# Patient Record
Sex: Male | Born: 1950
Health system: Southern US, Community
[De-identification: ages and names within clinical notes are randomized; demographics above are authoritative.]

## PROBLEM LIST (undated history)

## (undated) DIAGNOSIS — Z8619 Personal history of other infectious and parasitic diseases: Secondary | ICD-10-CM

## (undated) DIAGNOSIS — Z8673 Personal history of transient ischemic attack (TIA), and cerebral infarction without residual deficits: Secondary | ICD-10-CM

## (undated) DIAGNOSIS — M25511 Pain in right shoulder: Secondary | ICD-10-CM

## (undated) DIAGNOSIS — F32A Depression, unspecified: Secondary | ICD-10-CM

## (undated) DIAGNOSIS — F418 Other specified anxiety disorders: Secondary | ICD-10-CM

## (undated) DIAGNOSIS — K769 Liver disease, unspecified: Secondary | ICD-10-CM

## (undated) DIAGNOSIS — D649 Anemia, unspecified: Secondary | ICD-10-CM

## (undated) DIAGNOSIS — I639 Cerebral infarction, unspecified: Secondary | ICD-10-CM

## (undated) DIAGNOSIS — E785 Hyperlipidemia, unspecified: Secondary | ICD-10-CM

## (undated) DIAGNOSIS — F329 Major depressive disorder, single episode, unspecified: Secondary | ICD-10-CM

## (undated) DIAGNOSIS — I10 Essential (primary) hypertension: Secondary | ICD-10-CM

## (undated) DIAGNOSIS — F419 Anxiety disorder, unspecified: Secondary | ICD-10-CM

## (undated) HISTORY — DX: Anemia, unspecified: D64.9

## (undated) HISTORY — DX: Cerebral infarction, unspecified: I63.9

## (undated) HISTORY — DX: Hyperlipidemia, unspecified: E78.5

## (undated) HISTORY — DX: Personal history of other infectious and parasitic diseases: Z86.19

## (undated) HISTORY — DX: Major depressive disorder, single episode, unspecified: F32.9

## (undated) HISTORY — DX: Anxiety disorder, unspecified: F41.9

## (undated) HISTORY — DX: Liver disease, unspecified: K76.9

## (undated) HISTORY — DX: Personal history of transient ischemic attack (TIA), and cerebral infarction without residual deficits: Z86.73

## (undated) HISTORY — DX: Other specified anxiety disorders: F41.8

## (undated) HISTORY — DX: Pain in right shoulder: M25.511

## (undated) HISTORY — DX: Depression, unspecified: F32.A

---

## 2015-01-26 ENCOUNTER — Inpatient Hospital Stay (HOSPITAL_COMMUNITY)
Admission: EM | Admit: 2015-01-26 | Discharge: 2015-01-28 | DRG: 065 | Disposition: A | Discharge status: Home / Self Care | Payer: Self-pay | Attending: Internal Medicine | Admitting: Internal Medicine

## 2015-01-26 ENCOUNTER — Emergency Department (HOSPITAL_COMMUNITY): Payer: Self-pay

## 2015-01-26 ENCOUNTER — Encounter (HOSPITAL_COMMUNITY): Payer: Self-pay

## 2015-01-26 DIAGNOSIS — I63311 Cerebral infarction due to thrombosis of right middle cerebral artery: Principal | ICD-10-CM | POA: Diagnosis present

## 2015-01-26 DIAGNOSIS — R51 Headache: Secondary | ICD-10-CM

## 2015-01-26 DIAGNOSIS — F17210 Nicotine dependence, cigarettes, uncomplicated: Secondary | ICD-10-CM | POA: Diagnosis present

## 2015-01-26 DIAGNOSIS — R410 Disorientation, unspecified: Secondary | ICD-10-CM | POA: Diagnosis present

## 2015-01-26 DIAGNOSIS — Z79891 Long term (current) use of opiate analgesic: Secondary | ICD-10-CM

## 2015-01-26 DIAGNOSIS — I6529 Occlusion and stenosis of unspecified carotid artery: Secondary | ICD-10-CM | POA: Diagnosis present

## 2015-01-26 DIAGNOSIS — H547 Unspecified visual loss: Secondary | ICD-10-CM | POA: Diagnosis present

## 2015-01-26 DIAGNOSIS — E785 Hyperlipidemia, unspecified: Secondary | ICD-10-CM | POA: Insufficient documentation

## 2015-01-26 DIAGNOSIS — G8190 Hemiplegia, unspecified affecting unspecified side: Secondary | ICD-10-CM | POA: Diagnosis present

## 2015-01-26 DIAGNOSIS — F1010 Alcohol abuse, uncomplicated: Secondary | ICD-10-CM | POA: Diagnosis present

## 2015-01-26 DIAGNOSIS — Z8673 Personal history of transient ischemic attack (TIA), and cerebral infarction without residual deficits: Secondary | ICD-10-CM | POA: Insufficient documentation

## 2015-01-26 DIAGNOSIS — Z720 Tobacco use: Secondary | ICD-10-CM | POA: Insufficient documentation

## 2015-01-26 DIAGNOSIS — I6521 Occlusion and stenosis of right carotid artery: Secondary | ICD-10-CM | POA: Diagnosis present

## 2015-01-26 DIAGNOSIS — R4781 Slurred speech: Secondary | ICD-10-CM | POA: Diagnosis present

## 2015-01-26 DIAGNOSIS — Z791 Long term (current) use of non-steroidal anti-inflammatories (NSAID): Secondary | ICD-10-CM

## 2015-01-26 DIAGNOSIS — Z79899 Other long term (current) drug therapy: Secondary | ICD-10-CM

## 2015-01-26 DIAGNOSIS — R519 Headache, unspecified: Secondary | ICD-10-CM

## 2015-01-26 DIAGNOSIS — Z8249 Family history of ischemic heart disease and other diseases of the circulatory system: Secondary | ICD-10-CM

## 2015-01-26 DIAGNOSIS — K219 Gastro-esophageal reflux disease without esophagitis: Secondary | ICD-10-CM | POA: Diagnosis present

## 2015-01-26 DIAGNOSIS — Z9114 Patient's other noncompliance with medication regimen: Secondary | ICD-10-CM | POA: Diagnosis present

## 2015-01-26 DIAGNOSIS — R930 Abnormal findings on diagnostic imaging of skull and head, not elsewhere classified: Secondary | ICD-10-CM

## 2015-01-26 DIAGNOSIS — I639 Cerebral infarction, unspecified: Secondary | ICD-10-CM | POA: Diagnosis present

## 2015-01-26 DIAGNOSIS — I10 Essential (primary) hypertension: Secondary | ICD-10-CM | POA: Diagnosis present

## 2015-01-26 HISTORY — DX: Essential (primary) hypertension: I10

## 2015-01-26 LAB — BASIC METABOLIC PANEL
Anion gap: 8 (ref 5–15)
BUN: 11 mg/dL (ref 6–20)
CHLORIDE: 107 mmol/L (ref 101–111)
CO2: 23 mmol/L (ref 22–32)
Calcium: 8.6 mg/dL — ABNORMAL LOW (ref 8.9–10.3)
Creatinine, Ser: 0.88 mg/dL (ref 0.61–1.24)
GFR calc Af Amer: >60 mL/min (ref >60)
GFR calc non Af Amer: >60 mL/min (ref >60)
GLUCOSE: 82 mg/dL (ref 65–99)
Potassium: 4.4 mmol/L (ref 3.5–5.1)
Sodium: 138 mmol/L (ref 135–145)

## 2015-01-26 LAB — CBC WITH DIFFERENTIAL/PLATELET
Basophils Absolute: 0 10*3/uL (ref 0.0–0.1)
Basophils Relative: 0 % (ref 0–1)
EOS ABS: 0.2 10*3/uL (ref 0.0–0.7)
EOS PCT: 2 % (ref 0–5)
HEMATOCRIT: 43.6 % (ref 39.0–52.0)
Hemoglobin: 15 g/dL (ref 13.0–17.0)
LYMPHS ABS: 2.1 10*3/uL (ref 0.7–4.0)
LYMPHS PCT: 23 % (ref 12–46)
MCH: 30.2 pg (ref 26.0–34.0)
MCHC: 34.4 g/dL (ref 30.0–36.0)
MCV: 87.7 fL (ref 78.0–100.0)
MONO ABS: 0.4 10*3/uL (ref 0.1–1.0)
Monocytes Relative: 5 % (ref 3–12)
Neutro Abs: 6.2 10*3/uL (ref 1.7–7.7)
Neutrophils Relative %: 70 % (ref 43–77)
PLATELETS: 260 10*3/uL (ref 150–400)
RBC: 4.97 MIL/uL (ref 4.22–5.81)
RDW: 13.4 % (ref 11.5–15.5)
WBC: 8.9 10*3/uL (ref 4.0–10.5)

## 2015-01-26 LAB — I-STAT TROPONIN, ED: Troponin i, poc: 0 ng/mL (ref 0.00–0.08)

## 2015-01-26 MED ORDER — STROKE: EARLY STAGES OF RECOVERY BOOK
Freq: Once | Status: AC
  Administered 2015-01-26
  Filled 2015-01-26: qty 1

## 2015-01-26 MED ORDER — ENOXAPARIN SODIUM 40 MG/0.4ML ~~LOC~~ SOLN
40.0000 mg | SUBCUTANEOUS | Status: DC
  Administered 2015-01-27 – 2015-01-28 (×2): 40 mg via SUBCUTANEOUS
  Filled 2015-01-26 (×2): qty 0.4

## 2015-01-26 MED ORDER — HYDRALAZINE HCL 20 MG/ML IJ SOLN: 10.0000 mg | Freq: Once | INTRAMUSCULAR | Status: DC

## 2015-01-26 MED ORDER — SENNOSIDES-DOCUSATE SODIUM 8.6-50 MG PO TABS: 1.0000 | ORAL_TABLET | Freq: Every evening | ORAL | Status: DC | PRN

## 2015-01-26 NOTE — ED Provider Notes (Signed)
CSN: [id number]     Arrival date & time 01/26/15  1332 History   First MD Initiated Contact with Patient 01/26/15 1402     Chief Complaint  Patient presents with  . Altered Mental Status      HPI  Patient presents for evaluation via [facility] with a headache, hypertension, and change in behavior.  She hypertension. Is on lisinopril and hydrochlorothiazide. For the last 12 hours has "not felt right". He states yesterday he felt "weird" but cannot really specify. No family noticed that he is speaking last. However he is not aphasic. He is oriented lucid. He has a difficult coordination when he was getting something from out of a cabinet underneath the sink this morning and he lost his balance and struck his left fore head. He states he had a severe headache this morning on his right side, but this has resolved and he is pain-free now and no neck pain. No nausea or vomiting. Denies vision changes. No numbness or weakness to the extremities.  No past medical history on file. No past surgical history on file. No family history on file. History  Substance Use Topics  . Smoking status: Not on file  . Smokeless tobacco: Not on file  . Alcohol Use: Not on file    Review of Systems  Constitutional: Negative for fever, chills, diaphoresis, appetite change and fatigue.  HENT: Negative for mouth sores, sore throat and trouble swallowing.   Eyes: Negative for visual disturbance.  Respiratory: Negative for cough, chest tightness, shortness of breath and wheezing.   Cardiovascular: Negative for chest pain.  Gastrointestinal: Negative for nausea, vomiting, abdominal pain, diarrhea and abdominal distention.  Endocrine: Negative for polydipsia, polyphagia and polyuria.  Genitourinary: Negative for dysuria, frequency and hematuria.  Musculoskeletal: Negative for gait problem.  Skin: Negative for color change, pallor and rash.  Neurological: Positive for headaches. Negative for dizziness, syncope  and light-headedness.  Hematological: Does not bruise/bleed easily.  Psychiatric/Behavioral: Negative for behavioral problems and confusion.      Allergies  Review of patient's allergies indicates no known allergies.  Home Medications   Prior to Admission medications   Medication Sig Start Date End Date Taking? Authorizing Provider  amoxicillin-clavulanate (AUGMENTIN) 875-125 MG per tablet Take 1 tablet by mouth 2 (two) times daily.   Yes Historical Provider, MD  hydrochlorothiazide (HYDRODIURIL) 25 MG tablet Take 25 mg by mouth daily.   Yes Historical Provider, MD  HYDROcodone-acetaminophen (NORCO/VICODIN) 5-325 MG per tablet Take 1 tablet by mouth every 6 (six) hours as needed for moderate pain.   Yes Historical Provider, MD  ibuprofen (ADVIL,MOTRIN) 800 MG tablet Take 800 mg by mouth 3 (three) times daily.   Yes Historical Provider, MD  lisinopril (PRINIVIL,ZESTRIL) 20 MG tablet Take 20 mg by mouth every evening.   Yes Historical Provider, MD  naproxen sodium (ANAPROX) 220 MG tablet Take 220 mg by mouth 2 (two) times daily with a meal.   Yes Historical Provider, MD   BP 194/79 mmHg  Pulse 70  Temp(Src) 98.7 F (37.1 C) (Oral)  Resp 15  Ht 5\' 7"  (1.702 m)  Wt 175 lb (79.379 kg)  BMI 27.40 kg/m2  SpO2 96% Physical Exam  Constitutional: He is oriented to person, place, and time. He appears well-developed and well-nourished. No distress.  HENT:  Head: Normocephalic.  Eyes: Conjunctivae are normal. Pupils are equal, round, and reactive to light. No scleral icterus.  Neck: Normal range of motion. Neck supple. No thyromegaly present.  Cardiovascular:  Normal rate and regular rhythm.  Exam reveals no gallop and no friction rub.   No murmur heard. Pulmonary/Chest: Effort normal and breath sounds normal. No respiratory distress. He has no wheezes. He has no rales.  Abdominal: Soft. Bowel sounds are normal. He exhibits no distension. There is no tenderness. There is no rebound.   Musculoskeletal: Normal range of motion.  Neurological: He is alert and oriented to person, place, and time.  No cranial nerve deficits. However with vision testing is inconsistent with identifying movements to his left visual field testing. Minimal dysmetria with left upper shoulder but no pronator drift. Normal strength and use of the lower extremity. Gait not tested.  Skin: Skin is warm and dry. No rash noted.  Psychiatric: He has a normal mood and affect. His behavior is normal.    ED Course  Procedures (including critical care time) Labs Review Labs Reviewed  CBC WITH DIFFERENTIAL/PLATELET  BASIC METABOLIC PANEL  I-STAT TROPOININ, ED    Imaging Review Ct Head Wo Contrast  01/26/2015   CLINICAL DATA:  Patient fell off region for object over head. Confusion after fall.  EXAM: CT HEAD WITHOUT CONTRAST  TECHNIQUE: Contiguous axial images were obtained from the base of the skull through the vertex without intravenous contrast.  COMPARISON:  None.  FINDINGS: The ventricles are normal in size and configuration. There is decreased attenuation in the anterior right parietal lobe extending to involve a portion of the lateral superior right occipital lobe and posterior superior right temporal lobe. This appearance is consistent with an acute infarct. There is cytotoxic edema in this area. There is no other mass effect. There is no hemorrhage or extra-axial fluid. No midline shift. Elsewhere, there is slight small vessel disease in the centra semiovale bilaterally.  Bony calvarium appears intact.  The mastoid air cells are clear.  IMPRESSION: Findings consistent with an acute infarct involving portions of the posterior superior right temporal lobe, lateral right occipital lobe and a portion of the right parietal lobe. Although felt to be less likely, edema from an underlying neoplasm potentially could present in this manner. Brain MR to differentiate between these entities would be reasonable. Note that  there is no hemorrhage or midline shift. No extra-axial fluid collections are identified.  Elsewhere, there is rather minimal periventricular small vessel disease.  These results were called by telephone at the time of interpretation on 01/26/2015 at 2:49 pm to Dr. Tanna Furry , who verbally acknowledged these results.   Electronically Signed   By: Lowella Grip III M.D.   On: 01/26/2015 14:49     EKG Interpretation None      MDM   Final diagnoses:  Headache  Cerebral infarction due to unspecified mechanism    ED scan shows large area of encephalomalacia in the right temporoparietal brain. Likely CVA, versus tumor and edema per radiologist report. I discussed the case with Dr. Myrtie Cruise who will see the patient in consultation for neurology. Patient will be admitted to unassigned medicine.    Tanna Furry, MD 01/26/15 [phone]

## 2015-01-26 NOTE — Consult Note (Addendum)
Referring Physician: Dr Jeneen Rinks    Chief Complaint: - AMS - (Tx from [facility])  HPI: Ray Williams is an 64 y.o. male with a history of hypertension transferred to [facility] from [facility] for evaluation of speech difficulties, headache (resolved), AMS, and balance issues. Most of the history was obtained from the patient's family including his wife, daughter, and son-in-law. Apparently the patient has been having difficulties with severe hypertension recently. He was seen at the urgent care center and by his physician and prescribed Lotensin as well as hydrochlorothiazide. He does not take his medications as instructed because they make him feel bad.   He had to have a dental extraction earlier this week however the procedure was postponed secondary to elevated blood pressures. He returned on Thursday of this week and had 3 teeth pulled and was placed on amoxicillin, ibuprofen, and hydrocodone. On Friday he did not feel well; however, the family attributed this to having had his teeth pulled. When he went to bed last night he essentially seemed normal.   This morning he awoke at 7 AM with a severe headache. It was located over his right eye. He described it as dull and a 7 on a 1-10 scale. It lasted approximately one hour. He took several ibuprofen without relief. He denies taking any hydrocodone today. The patient's daughter reports that he had cluster headaches 3-4 years ago although these resolved. Today at approximately 10:30 the patient started to act strange, lethargic ,and was felt to be confused. He also had some balance problems and fell into a cabinet and bruised his forehead. He was brought to the emergency department for further evaluation. A CT scan was performed that was consistent with an acute infarct. An MRI/MRA is pending. The emergency room department nurse examined him prior to his MRI and gave him an NIH score of 1.  Date last known well: Date:  01/25/2015 Time last known well: Unable to determine tPA Given: No: Late presentation. Minimal deficits.  No past medical history on file. hypertension, remote cluster headaches, recent dental extractions  No past surgical history on file. No significant surgical history per family.  Family History: There is no family history of strokes.  Social History: The patient is a long-time smoker and smokes approximately 1 pack of cigarettes per day. He drinks about 12 beers over the course of a weekend.  Allergies: No Known Allergies  Medications:  No current facility-administered medications for this encounter.   Current Outpatient Prescriptions  Medication Sig Dispense Refill  . amoxicillin-clavulanate (AUGMENTIN) 875-125 MG per tablet Take 1 tablet by mouth 2 (two) times daily.    . hydrochlorothiazide (HYDRODIURIL) 25 MG tablet Take 25 mg by mouth daily.    Marland Kitchen HYDROcodone-acetaminophen (NORCO/VICODIN) 5-325 MG per tablet Take 1 tablet by mouth every 6 (six) hours as needed for moderate pain.    Marland Kitchen ibuprofen (ADVIL,MOTRIN) 800 MG tablet Take 800 mg by mouth 3 (three) times daily.    Marland Kitchen lisinopril (PRINIVIL,ZESTRIL) 20 MG tablet Take 20 mg by mouth every evening.    . naproxen sodium (ANAPROX) 220 MG tablet Take 220 mg by mouth 2 (two) times daily with a meal.       ROS: History obtained from The patient's family  General ROS: negative for - chills, fatigue, fever, night sweats, weight gain or weight loss. The patient has not felt well recently but they attributed this to recent dental extractions. He also had reported that his blood pressure medications made  him feel bad and he did not continue medications as prescribed. He has felt mildly fatigued. Psychological ROS: negative for - behavioral disorder, hallucinations, memory difficulties, mood swings or suicidal ideation Ophthalmic ROS: negative for - blurry vision, double vision, eye pain or loss of vision ENT ROS: negative for -  epistaxis, nasal discharge, oral lesions, sore throat, tinnitus or vertigo Allergy and Immunology ROS: negative for - hives or itchy/watery eyes Hematological and Lymphatic ROS: negative for - bleeding problems, bruising or swollen lymph nodes Endocrine ROS: negative for - galactorrhea, hair pattern changes, polydipsia/polyuria or temperature intolerance Respiratory ROS: negative for - cough, hemoptysis, shortness of breath or wheezing Cardiovascular ROS: negative for - chest pain, dyspnea on exertion, edema or irregular heartbeat Gastrointestinal ROS: negative for - abdominal pain, diarrhea, hematemesis, nausea/vomiting or stool incontinence Genito-Urinary ROS: negative for - dysuria, hematuria, incontinence or urinary frequency/urgency Musculoskeletal ROS: negative for - joint swelling or muscular weakness Neurological ROS: as noted in HPI Dermatological ROS: negative for rash and skin lesion changes   Physical Examination: Blood pressure 194/79, pulse 95, temperature 98.7 F (37.1 C), temperature source Oral, resp. rate 15, height 5\' 7"  (1.702 m), weight 79.379 kg (175 lb), SpO2 96 %.  General - 64 year old male with somewhat of an odd affect but in no acute distress. Heart - Regular rate and rhythm  Lungs - Clear but decreased Abdomen - Soft - non tender Extremities - Distal pulses intact - no edema Skin - Warm and dry  Mental Status: Alert, oriented, thought content appropriate.  Speech fluent without evidence of aphasia.  Able to follow 3 step commands without difficulty. Cranial Nerves: II: Discs not visualized; he has a partial left field cut., pupils equal, round, reactive to light and accommodation III,IV, VI: ? Mild left ptosis, extra-ocular motions intact bilaterally V,VII: smile symmetric, facial light touch sensation normal bilaterally VIII: hearing normal bilaterally IX,X: gag reflex present XI: bilateral shoulder shrug XII: midline tongue extension Motor: Right  : Upper extremity   5/5    Left:     Upper extremity   5/5  Lower extremity   5/5     Lower extremity   5/5 Tone and bulk:normal tone throughout; no atrophy noted Sensory: Pinprick and light touch intact throughout, bilaterally Deep Tendon Reflexes: 2+ and symmetric throughout Plantars: Right: downgoing   Left: downgoing Cerebellar: normal finger-to-nose, normal rapid alternating movements and normal heel-to-shin test Gait: Deferred    Laboratory Studies:  Basic Metabolic Panel:  Recent Labs Lab 01/26/15 1400  NA 138  K 4.4  CL 107  CO2 23  GLUCOSE 82  BUN 11  CREATININE 0.88  CALCIUM 8.6*    Liver Function Tests: No results for input(s): AST, ALT, ALKPHOS, BILITOT, PROT, ALBUMIN in the last 168 hours. No results for input(s): LIPASE, AMYLASE in the last 168 hours. No results for input(s): AMMONIA in the last 168 hours.  CBC:  Recent Labs Lab 01/26/15 1400  WBC 8.9  NEUTROABS 6.2  HGB 15.0  HCT 43.6  MCV 87.7  PLT 260    Cardiac Enzymes: No results for input(s): CKTOTAL, CKMB, CKMBINDEX, TROPONINI in the last 168 hours.  BNP: Invalid input(s): POCBNP  CBG: No results for input(s): GLUCAP in the last 168 hours.  Microbiology: No results found for this or any previous visit.  Coagulation Studies: No results for input(s): LABPROT, INR in the last 72 hours.  Urinalysis: No results for input(s): COLORURINE, LABSPEC, PHURINE, GLUCOSEU, HGBUR, BILIRUBINUR, KETONESUR, PROTEINUR, UROBILINOGEN, NITRITE, LEUKOCYTESUR in  the last 168 hours.  Invalid input(s): APPERANCEUR  Lipid Panel: No results found for: CHOL, TRIG, HDL, CHOLHDL, VLDL, LDLCALC  HgbA1C: No results found for: HGBA1C  Urine Drug Screen:  No results found for: LABOPIA, COCAINSCRNUR, LABBENZ, AMPHETMU, THCU, LABBARB  Alcohol Level: No results for input(s): ETH in the last 168 hours.  Other results: EKG: Sinus rhythm rate 73 bpm with multiple PVCs. Please see the formal cardiology reading  for complete details.  Imaging:  Ct Head Wo Contrast 01/26/2015    Findings consistent with an acute infarct involving portions of the posterior superior right temporal lobe, lateral right occipital lobe and a portion of the right parietal lobe. Although felt to be less likely, edema from an underlying neoplasm potentially could present in this manner. Brain MR to differentiate between these entities would be reasonable. Note that there is no hemorrhage or midline shift. No extra-axial fluid collections are identified.  Elsewhere, there is rather minimal periventricular small vessel disease.    MRI / MRA of the brain without contrast 01/26/2015 Pending   Assessment: 64 y.o. male with a history of hypertension but noncompliant with medications as they make him feel tired. He had 3 teeth removed this past Thursday and was prescribed amoxicillin, hydrocodone, and ibuprofen. He was somewhat lethargic on Friday but otherwise unremarkable. He awoke at 7 AM today with a severe headache over his left eye she described as a 7 on a 1-10 scale and lasted approximately one hour despite 1600 mg of ibuprofen. At around 10:30 AM the family felt he was acting strange and he was brought to [facility] and transferred to [facility] for [facility] evaluation. CT scan as noted above. MRI/MRA is pending.  Further assessment and plan per Dr. Leonel Ramsay  Stroke Risk Factors - hypertension and smoking  Plan: 1. HgbA1c, fasting lipid panel 2. MRI, MRA  of the brain without contrast 3. PT consult, OT consult, Speech consult 4. Echocardiogram 5. Carotid dopplers 6. Prophylactic therapy-Antiplatelet med: Aspirin - dose 81 mg daily 7. NPO until RN stroke swallow screen 8. Telemetry monitoring 9. Frequent neuro checks   Lowry Ram [facility] Pager [phone] 01/26/2015, 5:49 PM   I have seen and evaluated the patient. I have reviewed the above note and made appropriate  changes.    64 year old male with new right parietotemporal infarct. Given the acuity the onset of symptoms, I think neoplasm is less likely. He is going to be admitted for stroke workup.  Roland Rack, MD [facility] [phone]  If 7pm- 7am, please page neurology on call as listed in [place].

## 2015-01-26 NOTE — ED Notes (Signed)
Pt in from [facility], per report pt fell today while reaching to get something out of his cabinet, -LOC, per the pts family the pt was noted to be less talkative & confused this morning, last seen normal last night, A&O x4, follows commands, speaks in complete sentences, denies pain, non compliant with HTN med-Lisinopril, pt seen at [place] today for BP check & was told to come here, -slurred speech, no facial droop

## 2015-01-26 NOTE — ED Notes (Signed)
Neuro PA at bedside, pt remains in MRI

## 2015-01-26 NOTE — Progress Notes (Signed)
Attempted to get report from ED for pt transfer. Will call back. Bobbye Charleston, RN

## 2015-01-26 NOTE — Progress Notes (Signed)
Pt. Arrived to unit alert and oriented. Oriented to unit and room. Call bell at side. Bed alarm activated. Will continue to monitor. Bobbye Charleston, RN

## 2015-01-26 NOTE — ED Provider Notes (Addendum)
Physical Exam  BP 150/96 mmHg  Pulse 105  Temp(Src) 98.9 F (37.2 C) (Oral)  Resp 14  Ht 5\' 7"  (1.702 m)  Wt 175 lb (79.379 kg)  BMI 27.40 kg/m2  SpO2 97%  Physical Exam  ED Course  Procedures  MDM Patient remained down in the ED with no orders. Unassigned medicine was paged. But don't know who is suppose to admit the patient. Will page for unassigned again. MRI showed acute stroke. Will admit for stroke workup. Discussed with Dr. Arnoldo Morale, who will admit.   Results for orders placed or performed during the hospital encounter of 01/26/15  CBC with Differential/Platelet  Result Value Ref Range   WBC 8.9 4.0 - 10.5 K/uL   RBC 4.97 4.22 - 5.81 MIL/uL   Hemoglobin 15.0 13.0 - 17.0 g/dL   HCT 43.6 39.0 - 52.0 %   MCV 87.7 78.0 - 100.0 fL   MCH 30.2 26.0 - 34.0 pg   MCHC 34.4 30.0 - 36.0 g/dL   RDW 13.4 11.5 - 15.5 %   Platelets 260 150 - 400 K/uL   Neutrophils Relative % 70 43 - 77 %   Neutro Abs 6.2 1.7 - 7.7 K/uL   Lymphocytes Relative 23 12 - 46 %   Lymphs Abs 2.1 0.7 - 4.0 K/uL   Monocytes Relative 5 3 - 12 %   Monocytes Absolute 0.4 0.1 - 1.0 K/uL   Eosinophils Relative 2 0 - 5 %   Eosinophils Absolute 0.2 0.0 - 0.7 K/uL   Basophils Relative 0 0 - 1 %   Basophils Absolute 0.0 0.0 - 0.1 K/uL  Basic metabolic panel  Result Value Ref Range   Sodium 138 135 - 145 mmol/L   Potassium 4.4 3.5 - 5.1 mmol/L   Chloride 107 101 - 111 mmol/L   CO2 23 22 - 32 mmol/L   Glucose, Bld 82 65 - 99 mg/dL   BUN 11 6 - 20 mg/dL   Creatinine, Ser 0.88 0.61 - 1.24 mg/dL   Calcium 8.6 (L) 8.9 - 10.3 mg/dL   GFR calc non Af Amer >60 >60 mL/min   GFR calc Af Amer >60 >60 mL/min   Anion gap 8 5 - 15  I-stat troponin, ED  Result Value Ref Range   Troponin i, poc 0.00 0.00 - 0.08 ng/mL   Comment 3           Ct Head Wo Contrast  01/26/2015   CLINICAL DATA:  Patient fell off region for object over head. Confusion after fall.  EXAM: CT HEAD WITHOUT CONTRAST  TECHNIQUE: Contiguous axial  images were obtained from the base of the skull through the vertex without intravenous contrast.  COMPARISON:  None.  FINDINGS: The ventricles are normal in size and configuration. There is decreased attenuation in the anterior right parietal lobe extending to involve a portion of the lateral superior right occipital lobe and posterior superior right temporal lobe. This appearance is consistent with an acute infarct. There is cytotoxic edema in this area. There is no other mass effect. There is no hemorrhage or extra-axial fluid. No midline shift. Elsewhere, there is slight small vessel disease in the centra semiovale bilaterally.  Bony calvarium appears intact.  The mastoid air cells are clear.  IMPRESSION: Findings consistent with an acute infarct involving portions of the posterior superior right temporal lobe, lateral right occipital lobe and a portion of the right parietal lobe. Although felt to be less likely, edema from an underlying neoplasm potentially  could present in this manner. Brain MR to differentiate between these entities would be reasonable. Note that there is no hemorrhage or midline shift. No extra-axial fluid collections are identified.  Elsewhere, there is rather minimal periventricular small vessel disease.  These results were called by telephone at the time of interpretation on 01/26/2015 at 2:49 pm to Dr. Tanna Furry , who verbally acknowledged these results.   Electronically Signed   By: Lowella Grip III M.D.   On: 01/26/2015 14:49   [name] Head Wo Contrast  01/26/2015   CLINICAL DATA:  Abnormal CT.  Headache and hypertension.  EXAM: MRI HEAD WITHOUT CONTRAST  MRA HEAD WITHOUT CONTRAST  TECHNIQUE: Multiplanar, multiecho pulse sequences of the brain and surrounding structures were obtained without intravenous contrast. Angiographic images of the head were obtained using MRA technique without contrast.  COMPARISON:  CT head 01/24/2015  FINDINGS: MRI HEAD FINDINGS  Acute infarct in the  right temporoparietal lobe involving the posterior insula and parietal lobe. This corresponds to the abnormality on CT. Thrombus is noted in the parietal branch of the right middle cerebral artery. The right internal carotid artery is occluded.  Chronic infarct in the right frontal cortex. No other areas of chronic ischemia.  No intracranial hemorrhage.  Negative for mass or edema.  Ventricle size normal.  No shift of the midline structures.  Pituitary normal in size.  Mucosal edema in the paranasal sinuses.  MRA HEAD FINDINGS  Both vertebral arteries patent to the basilar. PICA patent bilaterally. Small fenestration proximal basilar. Basilar widely patent. Superior cerebellar and posterior cerebral arteries widely patent  Right internal carotid artery is occluded through the cavernous segment. Reconstitution of supraclinoid internal carotid artery. Severe stenosis right A1 segment. Right A2 segment patent. Right M1 segment is patent. There is occlusion of the parietal branch of the right middle cerebral artery. Right temporal branch is patent  Mild atherosclerotic disease left cavernous carotid. Mild stenosis left A1 segment. Moderate stenosis left M1 segment. Mild disease in the left middle cerebral artery branches.  IMPRESSION: Acute infarct right middle cerebral artery territory involving the posterior insula and right parietal lobe. Negative for hemorrhage  Occlusion of the right internal carotid artery. Occlusion of the parietal branch of the right middle cerebral artery.  Atherosclerotic disease in the anterior middle cerebral arteries bilaterally.   Electronically Signed   By: Franchot Gallo M.D.   On: 01/26/2015 18:13   Mr Brain Wo Contrast  01/26/2015   CLINICAL DATA:  Abnormal CT.  Headache and hypertension.  EXAM: MRI HEAD WITHOUT CONTRAST  MRA HEAD WITHOUT CONTRAST  TECHNIQUE: Multiplanar, multiecho pulse sequences of the brain and surrounding structures were obtained without intravenous contrast.  Angiographic images of the head were obtained using MRA technique without contrast.  COMPARISON:  CT head 01/24/2015  FINDINGS: MRI HEAD FINDINGS  Acute infarct in the right temporoparietal lobe involving the posterior insula and parietal lobe. This corresponds to the abnormality on CT. Thrombus is noted in the parietal branch of the right middle cerebral artery. The right internal carotid artery is occluded.  Chronic infarct in the right frontal cortex. No other areas of chronic ischemia.  No intracranial hemorrhage.  Negative for mass or edema.  Ventricle size normal.  No shift of the midline structures.  Pituitary normal in size.  Mucosal edema in the paranasal sinuses.  MRA HEAD FINDINGS  Both vertebral arteries patent to the basilar. PICA patent bilaterally. Small fenestration proximal basilar. Basilar widely patent. Superior cerebellar and  posterior cerebral arteries widely patent  Right internal carotid artery is occluded through the cavernous segment. Reconstitution of supraclinoid internal carotid artery. Severe stenosis right A1 segment. Right A2 segment patent. Right M1 segment is patent. There is occlusion of the parietal branch of the right middle cerebral artery. Right temporal branch is patent  Mild atherosclerotic disease left cavernous carotid. Mild stenosis left A1 segment. Moderate stenosis left M1 segment. Mild disease in the left middle cerebral artery branches.  IMPRESSION: Acute infarct right middle cerebral artery territory involving the posterior insula and right parietal lobe. Negative for hemorrhage  Occlusion of the right internal carotid artery. Occlusion of the parietal branch of the right middle cerebral artery.  Atherosclerotic disease in the anterior middle cerebral arteries bilaterally.   Electronically Signed   By: Franchot Gallo M.D.   On: 01/26/2015 18:13          Wandra Arthurs, MD 01/26/15 2108  Wandra Arthurs, MD 01/26/15 2109

## 2015-01-26 NOTE — ED Notes (Signed)
Patient transported to CT 

## 2015-01-26 NOTE — ED Notes (Signed)
Attempted to call report

## 2015-01-26 NOTE — Progress Notes (Addendum)
Report attempted. Nurse will call back. Bobbye Charleston, RN

## 2015-01-26 NOTE — H&P (Addendum)
[facility] Admission History and Physical       Ray Williams WUJ:[id number] DOB: 10/21/1950 DOA: 01/26/2015  Referring physician:   PCP: Pcp Not In System  Specialists:   Chief Complaint: Headache Confusion and Slurred Speech  HPI: Ray Williams is a 64 y.o. male with a history of Uncontrolled HTN, who was seen at an [facility] in [place] due to Headache Confusion and Dysarthria since the AM and sent by EMS to [facility] for a Stoke Workup   His family reports that he was last seen normal last night , and in the AM upon awakening he had complaints of a right Temporal area headache.  At 10:45 his daughter called him on the telephone and he was not acting right, and his speech was slurred.  He was confused.  He had also hit the Left side of his head on the corner of a cabinet while trying to reach something.    His family took him to an area [facility] and was transferred via EMS to [facility], but he was outside of the window for TPA.  He was seen by Neurology and a CT scan and MRI/MRA were performed which revealed and acute Right MCA Territory Infarct as well as occlusion of the Right Internal Carotid ARtery.     He was referred for further evaluation.      Review of Systems:  Constitutional: No Weight Loss, No Weight Gain, Night Sweats, Fevers, Chills, Dizziness, Light Headedness, Fatigue, or Generalized Weakness HEENT:  +Headaches, Difficulty Swallowing,Tooth/Dental Problems,Sore Throat,  No Sneezing, Rhinitis, Ear Ache, Nasal Congestion, or Post Nasal Drip,  Cardio-vascular:  No Chest pain, Orthopnea, PND, Edema in Lower Extremities, Anasarca, Dizziness, Palpitations  Resp: No Dyspnea, No DOE, No Productive Cough, No Non-Productive Cough, No Hemoptysis, No Wheezing.    GI: No Heartburn, Indigestion, Abdominal Pain, Nausea, Vomiting, Diarrhea, Constipation, Hematemesis, Hematochezia, Melena, Change in Bowel Habits,  Loss of Appetite  GU: No Dysuria, No Change in Color of Urine, No  Urgency or Urinary Frequency, No Flank pain.  Musculoskeletal: No Joint Pain or Swelling, No Decreased Range of Motion, No Back Pain.  Neurologic: No Syncope, No Seizures, Muscle Weakness, Paresthesia, Vision Disturbance or Loss, No Diplopia, No Vertigo, +Dysarthria, No Difficulty Walking,  Skin: No Rash or Lesions. Psych: No Change in Mood or Affect, No Depression or Anxiety, No Memory loss, +Confusion, or Hallucinations     Past Medical History:  Hypertension    Past Surgical History:    Recent Dental Extraction    Prior to Admission medications   Medication Sig Start Date End Date Taking? Authorizing Provider  amoxicillin-clavulanate (AUGMENTIN) 875-125 MG per tablet Take 1 tablet by mouth 2 (two) times daily.   Yes Historical Provider, MD  hydrochlorothiazide (HYDRODIURIL) 25 MG tablet Take 25 mg by mouth daily.   Yes Historical Provider, MD  HYDROcodone-acetaminophen (NORCO/VICODIN) 5-325 MG per tablet Take 1 tablet by mouth every 6 (six) hours as needed for moderate pain.   Yes Historical Provider, MD  ibuprofen (ADVIL,MOTRIN) 800 MG tablet Take 800 mg by mouth 3 (three) times daily.   Yes Historical Provider, MD  lisinopril (PRINIVIL,ZESTRIL) 20 MG tablet Take 20 mg by mouth every evening.   Yes Historical Provider, MD  naproxen sodium (ANAPROX) 220 MG tablet Take 220 mg by mouth 2 (two) times daily with a meal.   Yes Historical Provider, MD     No Known Allergies     Social History:  has no tobacco, alcohol, and drug  history on file.      Family History:     + HTN   Physical Exam:  GEN:  Pleasant  64 y.o. male examined and in no acute distress; cooperative with exam Filed Vitals:   01/26/15 1900 01/26/15 1929 01/26/15 1930 01/26/15 2030  BP: 174/83  150/96 160/65  Pulse: 88  105 42  Temp:  98.9 F (37.2 C)    TempSrc:      Resp: 12  14 13   Height:      Weight:      SpO2: 97%  97% 98%   Blood pressure 160/65, pulse 42, temperature 98.9 F (37.2 C),  temperature source Oral, resp. rate 13, height 5\' 7"  (1.702 m), weight 79.379 kg (175 lb), SpO2 98 %. PSYCH: He is alert and oriented x4; does not appear anxious does not appear depressed; affect is normal HEENT: Normocephalic and Atraumatic, Mucous membranes pink; PERRLA; EOM intact; Fundi:  Benign;  No scleral icterus, Nares: Patent, Oropharynx: Clear,  Fair Dentition,    Neck:  FROM, No Cervical Lymphadenopathy nor Thyromegaly or Carotid Bruit; No JVD; Breasts:: Not examined CHEST WALL: No tenderness CHEST: Normal respiration, clear to auscultation bilaterally HEART: Regular rate and rhythm; no murmurs rubs or gallops BACK: No kyphosis or scoliosis; No CVA tenderness ABDOMEN: Positive Bowel Sounds, Soft Non-Tender, No Rebound or Guarding; No Masses, No Organomegaly. Rectal Exam: Not done EXTREMITIES: No  Cyanosis, Clubbing, or Edema; No Ulcerations. Genitalia: not examined PULSES: 2+ and symmetric SKIN: Normal hydration no rash or ulceration  CNS:  Alert and Oriented x 4, No Focal Deficits Mental Status:  Alert, Oriented, Thought Content Appropriate. Speech Fluent without evidence of Aphasia. Able to follow 3 step commands without difficulty.  In No obvious pain.   Cranial Nerves:  II: Discs flat bilaterally; Visual fields Intact, Pupils equal and reactive.    III,IV, VI: Extra-ocular motions intact bilaterally    V,VII: smile symmetric, facial light touch sensation normal bilaterally    VIII: hearing intact or decreasesd bilaterally    IX,X: gag reflex present    XI: bilateral shoulder shrug    XII: midline tongue extension   Motor:  Right:  Upper extremity 5/5     Left:  Upper extremity 5/5     Right:  Lower extremity 5/5    Left:  Lower extremity 5/5     Tone and Bulk:  normal tone throughout; no atrophy noted   Sensory:  Pinprick and light touch intact throughout, bilaterally   Deep Tendon Reflexes: 2+ and symmetric throughout   Plantars/ Babinski:  Right: equivocal or  upgoing or normal Left: equivocal or upgoing or normal    Cerebellar:  Finger to nose without difficulty.   Gait: deferred    Vascular: pulses palpable throughout    Labs on Admission:  Basic Metabolic Panel:  Recent Labs Lab 01/26/15 1400  NA 138  K 4.4  CL 107  CO2 23  GLUCOSE 82  BUN 11  CREATININE 0.88  CALCIUM 8.6*   Liver Function Tests: No results for input(s): AST, ALT, ALKPHOS, BILITOT, PROT, ALBUMIN in the last 168 hours. No results for input(s): LIPASE, AMYLASE in the last 168 hours. No results for input(s): AMMONIA in the last 168 hours. CBC:  Recent Labs Lab 01/26/15 1400  WBC 8.9  NEUTROABS 6.2  HGB 15.0  HCT 43.6  MCV 87.7  PLT 260   Cardiac Enzymes: No results for input(s): CKTOTAL, CKMB, CKMBINDEX, TROPONINI in the last 168 hours.  BNP (last 3 results) No results for input(s): BNP in the last 8760 hours.  ProBNP (last 3 results) No results for input(s): PROBNP in the last 8760 hours.  CBG: No results for input(s): GLUCAP in the last 168 hours.  Radiological Exams on Admission: Ct Head Wo Contrast  01/26/2015   CLINICAL DATA:  Patient fell off region for object over head. Confusion after fall.  EXAM: CT HEAD WITHOUT CONTRAST  TECHNIQUE: Contiguous axial images were obtained from the base of the skull through the vertex without intravenous contrast.  COMPARISON:  None.  FINDINGS: The ventricles are normal in size and configuration. There is decreased attenuation in the anterior right parietal lobe extending to involve a portion of the lateral superior right occipital lobe and posterior superior right temporal lobe. This appearance is consistent with an acute infarct. There is cytotoxic edema in this area. There is no other mass effect. There is no hemorrhage or extra-axial fluid. No midline shift. Elsewhere, there is slight small vessel disease in the centra semiovale bilaterally.  Bony calvarium appears intact.  The mastoid air cells are clear.   IMPRESSION: Findings consistent with an acute infarct involving portions of the posterior superior right temporal lobe, lateral right occipital lobe and a portion of the right parietal lobe. Although felt to be less likely, edema from an underlying neoplasm potentially could present in this manner. Brain MR to differentiate between these entities would be reasonable. Note that there is no hemorrhage or midline shift. No extra-axial fluid collections are identified.  Elsewhere, there is rather minimal periventricular small vessel disease.  These results were called by telephone at the time of interpretation on 01/26/2015 at 2:49 pm to Dr. Tanna Furry , who verbally acknowledged these results.   Electronically Signed   By: Lowella Grip III M.D.   On: 01/26/2015 14:49   [name] Head Wo Contrast  01/26/2015   CLINICAL DATA:  Abnormal CT.  Headache and hypertension.  EXAM: MRI HEAD WITHOUT CONTRAST  MRA HEAD WITHOUT CONTRAST  TECHNIQUE: Multiplanar, multiecho pulse sequences of the brain and surrounding structures were obtained without intravenous contrast. Angiographic images of the head were obtained using MRA technique without contrast.  COMPARISON:  CT head 01/24/2015  FINDINGS: MRI HEAD FINDINGS  Acute infarct in the right temporoparietal lobe involving the posterior insula and parietal lobe. This corresponds to the abnormality on CT. Thrombus is noted in the parietal branch of the right middle cerebral artery. The right internal carotid artery is occluded.  Chronic infarct in the right frontal cortex. No other areas of chronic ischemia.  No intracranial hemorrhage.  Negative for mass or edema.  Ventricle size normal.  No shift of the midline structures.  Pituitary normal in size.  Mucosal edema in the paranasal sinuses.  MRA HEAD FINDINGS  Both vertebral arteries patent to the basilar. PICA patent bilaterally. Small fenestration proximal basilar. Basilar widely patent. Superior cerebellar and posterior  cerebral arteries widely patent  Right internal carotid artery is occluded through the cavernous segment. Reconstitution of supraclinoid internal carotid artery. Severe stenosis right A1 segment. Right A2 segment patent. Right M1 segment is patent. There is occlusion of the parietal branch of the right middle cerebral artery. Right temporal branch is patent  Mild atherosclerotic disease left cavernous carotid. Mild stenosis left A1 segment. Moderate stenosis left M1 segment. Mild disease in the left middle cerebral artery branches.  IMPRESSION: Acute infarct right middle cerebral artery territory involving the posterior insula and right parietal lobe. Negative  for hemorrhage  Occlusion of the right internal carotid artery. Occlusion of the parietal branch of the right middle cerebral artery.  Atherosclerotic disease in the anterior middle cerebral arteries bilaterally.   Electronically Signed   By: Franchot Gallo M.D.   On: 01/26/2015 18:13   Mr Brain Wo Contrast  01/26/2015   CLINICAL DATA:  Abnormal CT.  Headache and hypertension.  EXAM: MRI HEAD WITHOUT CONTRAST  MRA HEAD WITHOUT CONTRAST  TECHNIQUE: Multiplanar, multiecho pulse sequences of the brain and surrounding structures were obtained without intravenous contrast. Angiographic images of the head were obtained using MRA technique without contrast.  COMPARISON:  CT head 01/24/2015  FINDINGS: MRI HEAD FINDINGS  Acute infarct in the right temporoparietal lobe involving the posterior insula and parietal lobe. This corresponds to the abnormality on CT. Thrombus is noted in the parietal branch of the right middle cerebral artery. The right internal carotid artery is occluded.  Chronic infarct in the right frontal cortex. No other areas of chronic ischemia.  No intracranial hemorrhage.  Negative for mass or edema.  Ventricle size normal.  No shift of the midline structures.  Pituitary normal in size.  Mucosal edema in the paranasal sinuses.  MRA HEAD FINDINGS   Both vertebral arteries patent to the basilar. PICA patent bilaterally. Small fenestration proximal basilar. Basilar widely patent. Superior cerebellar and posterior cerebral arteries widely patent  Right internal carotid artery is occluded through the cavernous segment. Reconstitution of supraclinoid internal carotid artery. Severe stenosis right A1 segment. Right A2 segment patent. Right M1 segment is patent. There is occlusion of the parietal branch of the right middle cerebral artery. Right temporal branch is patent  Mild atherosclerotic disease left cavernous carotid. Mild stenosis left A1 segment. Moderate stenosis left M1 segment. Mild disease in the left middle cerebral artery branches.  IMPRESSION: Acute infarct right middle cerebral artery territory involving the posterior insula and right parietal lobe. Negative for hemorrhage  Occlusion of the right internal carotid artery. Occlusion of the parietal branch of the right middle cerebral artery.  Atherosclerotic disease in the anterior middle cerebral arteries bilaterally.   Electronically Signed   By: Franchot Gallo M.D.   On: 01/26/2015 18:13     EKG: Independently reviewed. Normal Sinus Rhythm Rate 73 with occasional PVCs   Assessment/Plan:   64 y.o. male with  Principal Problem:    1.    Stroke- on CT scan and MRI/MRA   Cardiac Monitoring   Neuro Checks   Further Risk Stratification    Check Fasting Lipids , HbA1C in AM   Carotid US and 2 D ECHO in AM   ASA Rx   Neuro Saw in ED   PT, OT,  and Speech evaluations in AM   Vascular Consult in AM         Active Problems:   2.    ICAO (internal carotid artery occlusion)- on Rt per MRA results   Vascular Consult in AM      3.    Essential hypertension- Uncontrolled, Noncompliant with Lisinopril and HCTZ due to "How it makes him feel"   Monitor BPs   PRN IV Hydralazine for SBP > 160        4.    DVT Prophylaxis   Lovenox        Code Status:     FULL CODE        Family  Communication:   Family at Bedside     Disposition Plan:  Inpatient  Observation Status        Time spent:  75 Minutes      [facility] Pager [phone]   If [place] Please Contact the Day Rounding Team MD for [facility]  If 7PM-7AM, Please Contact Night-Floor Coverage  [email or web address] Password [facility] 01/26/2015, 9:32 PM     ADDENDUM:   Patient was seen and examined on 01/26/2015

## 2015-01-27 ENCOUNTER — Inpatient Hospital Stay (HOSPITAL_BASED_OUTPATIENT_CLINIC_OR_DEPARTMENT_OTHER): Payer: Self-pay

## 2015-01-27 ENCOUNTER — Inpatient Hospital Stay (HOSPITAL_COMMUNITY): Payer: Self-pay

## 2015-01-27 ENCOUNTER — Encounter (HOSPITAL_COMMUNITY): Payer: Self-pay

## 2015-01-27 ENCOUNTER — Inpatient Hospital Stay (HOSPITAL_COMMUNITY): Payer: MEDICAID

## 2015-01-27 DIAGNOSIS — E785 Hyperlipidemia, unspecified: Secondary | ICD-10-CM

## 2015-01-27 DIAGNOSIS — I639 Cerebral infarction, unspecified: Secondary | ICD-10-CM

## 2015-01-27 DIAGNOSIS — Z720 Tobacco use: Secondary | ICD-10-CM

## 2015-01-27 DIAGNOSIS — I10 Essential (primary) hypertension: Secondary | ICD-10-CM

## 2015-01-27 DIAGNOSIS — I6521 Occlusion and stenosis of right carotid artery: Secondary | ICD-10-CM

## 2015-01-27 LAB — LIPID PANEL
CHOLESTEROL: 234 mg/dL — AB (ref 0–200)
HDL: 31 mg/dL — ABNORMAL LOW (ref >40)
LDL Cholesterol: 171 mg/dL — ABNORMAL HIGH (ref 0–99)
TRIGLYCERIDES: 158 mg/dL — AB (ref <150)
Total CHOL/HDL Ratio: 7.5 RATIO
VLDL: 32 mg/dL (ref 0–40)

## 2015-01-27 MED ORDER — PANTOPRAZOLE SODIUM 40 MG PO TBEC
40.0000 mg | DELAYED_RELEASE_TABLET | Freq: Every day | ORAL | Status: DC
  Administered 2015-01-27 – 2015-01-28 (×2): 40 mg via ORAL
  Filled 2015-01-27 (×2): qty 1

## 2015-01-27 MED ORDER — ATORVASTATIN CALCIUM 40 MG PO TABS
40.0000 mg | ORAL_TABLET | Freq: Every day | ORAL | Status: DC
  Administered 2015-01-27 – 2015-01-28 (×2): 40 mg via ORAL
  Filled 2015-01-27 (×2): qty 1

## 2015-01-27 MED ORDER — HYDRALAZINE HCL 20 MG/ML IJ SOLN: 10.0000 mg | Freq: Three times a day (TID) | INTRAMUSCULAR | Status: DC | PRN

## 2015-01-27 MED ORDER — NICOTINE 14 MG/24HR TD PT24
14.0000 mg | MEDICATED_PATCH | Freq: Every day | TRANSDERMAL | Status: DC
  Administered 2015-01-27 – 2015-01-28 (×2): 14 mg via TRANSDERMAL
  Filled 2015-01-27 (×2): qty 1

## 2015-01-27 MED ORDER — IOHEXOL 350 MG/ML SOLN
50.0000 mL | Freq: Once | INTRAVENOUS | Status: AC | PRN
  Administered 2015-01-27: 50 mL via INTRAVENOUS

## 2015-01-27 MED ORDER — ASPIRIN EC 81 MG PO TBEC
81.0000 mg | DELAYED_RELEASE_TABLET | Freq: Every day | ORAL | Status: DC
  Administered 2015-01-27 – 2015-01-28 (×2): 81 mg via ORAL
  Filled 2015-01-27 (×2): qty 1

## 2015-01-27 NOTE — Progress Notes (Signed)
STROKE TEAM PROGRESS NOTE   HISTORY Ray Williams is an 64 y.o. male with a history of hypertension transferred to [facility] from [facility] for evaluation of speech difficulties, headache (resolved), AMS, and balance issues. Most of the history was obtained from the patient's family including his wife, daughter, and son-in-law. Apparently the patient has been having difficulties with severe hypertension recently. He was seen at the urgent care center and by his physician and prescribed Lotensin as well as hydrochlorothiazide. He does not take his medications as instructed because they make him feel bad.   He had to have a dental extraction earlier this week however the procedure was postponed secondary to elevated blood pressures. He returned on Thursday of this week and had 3 teeth pulled and was placed on amoxicillin, ibuprofen, and hydrocodone. On Friday he did not feel well; however, the family attributed this to having had his teeth pulled. When he went to bed last night he essentially seemed normal.   This morning he awoke at 7 AM with a severe headache. It was located over his right eye. He described it as dull and a 7 on a 1-10 scale. It lasted approximately one hour. He took several ibuprofen without relief. He denies taking any hydrocodone today. The patient's daughter reports that he had cluster headaches 3-4 years ago although these resolved. Today at approximately 10:30 the patient started to act strange, lethargic ,and was felt to be confused. He also had some balance problems and fell into a cabinet and bruised his forehead. He was brought to the emergency department for further evaluation. A CT scan was performed that was consistent with an acute infarct. An MRI/MRA is pending. The emergency room department nurse examined him prior to his MRI and gave him an NIH score of 1.  Date last known well: Date: 01/25/2015 Time last known well: Unable to determine tPA Given: No:  Late presentation. Minimal deficits.   SUBJECTIVE (INTERVAL HISTORY) Multiple family members at bedside. They think he is not as verbal as usual. Seems tire. No headaches.  Speech fine.    OBJECTIVE Temp:  [98.5 F (36.9 C)-99.1 F (37.3 C)] 98.5 F (36.9 C) (06/19 0650) Pulse Rate:  [36-105] 74 (06/19 0451) Cardiac Rhythm:  [-] Normal sinus rhythm (06/19 0100) Resp:  [9-20] 20 (06/19 0650) BP: (150-194)/(53-96) 186/76 mmHg (06/19 0650) SpO2:  [95 %-100 %] 98 % (06/19 0650) Weight:  [77.2 kg (170 lb 3.1 oz)-79.379 kg (175 lb)] 77.2 kg (170 lb 3.1 oz) (06/18 2300)  No results for input(s): GLUCAP in the last 168 hours.  Recent Labs Lab 01/26/15 1400  NA 138  K 4.4  CL 107  CO2 23  GLUCOSE 82  BUN 11  CREATININE 0.88  CALCIUM 8.6*   No results for input(s): AST, ALT, ALKPHOS, BILITOT, PROT, ALBUMIN in the last 168 hours.  Recent Labs Lab 01/26/15 1400  WBC 8.9  NEUTROABS 6.2  HGB 15.0  HCT 43.6  MCV 87.7  PLT 260   No results for input(s): CKTOTAL, CKMB, CKMBINDEX, TROPONINI in the last 168 hours. No results for input(s): LABPROT, INR in the last 72 hours. No results for input(s): COLORURINE, LABSPEC, [place], GLUCOSEU, HGBUR, BILIRUBINUR, KETONESUR, PROTEINUR, UROBILINOGEN, NITRITE, LEUKOCYTESUR in the last 72 hours.  Invalid input(s): APPERANCEUR     Component Value Date/Time   CHOL 234* 01/27/2015 0600   TRIG 158* 01/27/2015 0600   HDL 31* 01/27/2015 0600   CHOLHDL 7.5 01/27/2015 0600   VLDL 32  01/27/2015 0600   LDLCALC 171* 01/27/2015 0600   No results found for: HGBA1C No results found for: LABOPIA, COCAINSCRNUR, LABBENZ, AMPHETMU, THCU, LABBARB  No results for input(s): ETH in the last 168 hours.    Imaging   Dg Chest 2 View 01/27/2015    No acute cardiopulmonary abnormality.     Ct Head Wo Contrast 01/26/2015    Findings consistent with an acute infarct involving portions of the posterior superior right temporal lobe, lateral right  occipital lobe and a portion of the right parietal lobe. Although felt to be less likely, edema from an underlying neoplasm potentially could present in this manner. Brain MR to differentiate between these entities would be reasonable. Note that there is no hemorrhage or midline shift. No extra-axial fluid collections are identified.  Elsewhere, there is rather minimal periventricular small vessel disease.     [name] Head Wo Contrast 01/26/2015    Acute infarct right middle cerebral artery territory involving the posterior insula and right parietal lobe.  Negative for hemorrhage   Occlusion of the right internal carotid artery.  Occlusion of the parietal branch of the right middle cerebral artery.   Atherosclerotic disease in the anterior middle cerebral arteries bilaterally.      Both scans reviewed in person.   PHYSICAL EXAM  GENERAL: No distress  HEENT: normal.  ABDOMEN: soft  EXTREMITIES: No edema   BACK: normal  SKIN: Normal by inspection.    MENTAL STATUS: Alert but with abulic affect. Follows commands briskly. Speech, language and cognition are generally fine. Judgment and insight fair.   CRANIAL NERVES: Pupils are equal, round and reactive to light and accomodation; extra ocular movements are full, there is no significant nystagmus; visual fields incongruent L homonomous hemianopia ; upper and lower facial muscles are normal in strength and symmetric, there is no flattening of the nasolabial folds; tongue is midline; uvula is midline; shoulder elevation is normal.  MOTOR: Normal tone, bulk and strength; no pronator drift.  COORDINATION: Left finger to nose is normal, right finger to nose is normal, No rest tremor; no intention tremor; no postural tremor; no bradykinesia.  SENSATION: Normal to light touch and temperature.        ASSESSMENT/PLAN [name] is a 64 y.o. male with history of tobacco use, hypertension and recent dental extractions presenting  with AMS. He did not receive IV t-PA due to late presentation and minimal deficits.  Stroke:  Non-dominant infarcts secondary to occlusion of the right internal carotid artery and the parietal branch of the right middle cerebral artery.  Resultant  AMS / abulia   MRI  as above  MRA  as above  Carotid Doppler  Pending---  Will go ahead and get extracranial CTA.  2D Echo  pending  LDL 171  HgbA1c pending  Lovenox for VTE prophylaxis  Diet Heart Room service appropriate?: Yes; Fluid consistency:: Thin  no antithrombotic prior to admission, now on aspirin 81 mg orally every day  Patient counseled to be compliant with his antithrombotic medications  Ongoing aggressive stroke risk factor management  Therapy recommendations: Pending  Disposition:  Pending  Hypertension  Home meds:  Lisinopril and hydrochlorothiazide Permissive hypertension <220/120 for 24-48 hours and then gradually normalize within 5-7 days Patient counseled to be compliant with his blood pressure medications  Hyperlipidemia  Home meds:  No lipid lowering medications prior to admission  LDL 171, goal < 70  Add Lipitor 40 mg daily  Continue statin at discharge  Other Stroke Risk Factors  Advanced age  Cigarette smoker, advised to stop smoking  ETOH use   Other Active Problems  Smoking cessation  Other Pertinent History  Remote history of cluster headaches  Hospital day # 1  Dr. Merlene Laughter - The patient was seen and examined by me; notes, chart and tests reviewed and discussed with midlevel provider, other providers, patient, and family.       To contact Stroke Continuity provider, please refer to [email or web address] After hours, contact General Neurology

## 2015-01-27 NOTE — Progress Notes (Addendum)
VASCULAR LAB PRELIMINARY  PRELIMINARY  PRELIMINARY  PRELIMINARY  Carotid Doppler completed.    Preliminary report:  The right ICA appears occluded, just past the origin.  There is significant right ECA stenosis.  1-39% left ICA stenosis.  Bilateral vertebral artery flow is antegrade.   Valine Drozdowski, RVT 01/27/2015, 4:14 PM

## 2015-01-27 NOTE — Evaluation (Signed)
Physical Therapy Evaluation Patient Details Name: Ray Williams MRN: [id number] DOB: 05/30/1951 Today's Date: 01/27/2015   History of Present Illness  pt admitted with R sided HA, confusion and slurred speech.   CT/MRI shows R MCA territory infarct and carotid art occ.  Clinical Impression  Pt admitted with/for s/s of stroke, confirmed on ET/MRI.Marland Kitchen  Pt currently limited functionally due to the problems listed below.  (see problems list.)  Pt will benefit from PT to maximize function and safety to be able to get home safely with available assist of family.     Follow Up Recommendations Outpatient PT;Supervision for mobility/OOB    Equipment Recommendations  None recommended by PT    Recommendations for Other Services       Precautions / Restrictions Precautions Precautions:  (minimal fall risk)      Mobility  Bed Mobility Overal bed mobility: Needs Assistance Bed Mobility: Supine to Sit;Sit to Supine     Supine to sit: Min guard Sit to supine: Supervision   General bed mobility comments: slow with some incoordination getting OOB  Transfers Overall transfer level: Needs assistance   Transfers: Sit to/from Stand Sit to Stand: Supervision            Ambulation/Gait Ambulation/Gait assistance: Min guard Ambulation Distance (Feet): 280 Feet Assistive device: None Gait Pattern/deviations: Step-through pattern Gait velocity: slower   General Gait Details: generally steady, but guarded, mildly incoordinated mostly due to visual cut or inattension in L peripheral hemi field  Stairs            Wheelchair Mobility    Modified Rankin (Stroke Patients Only) Modified Rankin (Stroke Patients Only) Pre-Morbid Rankin Score: No symptoms Modified Rankin: Moderately severe disability     Balance Overall balance assessment: Needs assistance Sitting-balance support: No upper extremity supported Sitting balance-Leahy Scale: Fair     Standing balance support: No  upper extremity supported Standing balance-Leahy Scale: Fair                               Pertinent Vitals/Pain Pain Assessment: No/denies pain    Home Living Family/patient expects to be discharged to:: Private residence Living Arrangements: Spouse/significant other Available Help at Discharge: Family;Available 24 hours/day Type of Home: House Home Access: Level entry     Home Layout: Two level;Other (Comment) ("Man-cave" in the basement) Home Equipment: None      Prior Function Level of Independence: Independent         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, maintenance (IT)        Extremity/Trunk Assessment   Upper Extremity Assessment: Defer to OT evaluation           Lower Extremity Assessment: Overall WFL for tasks assessed (mild L side incoordination)         Communication   Communication: Other (comment) (mildly slurred speech)  Cognition Arousal/Alertness: Awake/alert Behavior During Therapy: Flat affect Overall Cognitive Status: Impaired/Different from baseline Area of Impairment: Attention;Awareness   Current Attention Level: Selective       Awareness: Intellectual        General Comments General comments (skin integrity, edema, etc.): Visual disturbances in L field that disrupt how pt interacts with his environment.  Inattention vs field cut.  See OT note.    Exercises        Assessment/Plan    PT Assessment Patient needs continued PT services  PT Diagnosis Abnormality of  gait   PT Problem List Decreased mobility;Decreased coordination;Other (comment) (visual disturbance/inattention/ paresis on L)  PT Treatment Interventions Gait training;Stair training;Functional mobility training;Therapeutic activities;Neuromuscular re-education;Patient/family education   PT Goals (Current goals can be found in the Care Plan section) Acute Rehab PT Goals Patient Stated Goal: pt did not participate Time For Goal  Achievement: 02/03/15 Potential to Achieve Goals: Good    Frequency Min 3X/week   Barriers to discharge        Co-evaluation               End of Session   Activity Tolerance: Patient tolerated treatment well Patient left: in bed;with call bell/phone within reach;with family/visitor present Nurse Communication: Mobility status         Time: 1791-5056 PT Time Calculation (min) (ACUTE ONLY): 20 min   Charges:   PT Evaluation $Initial PT Evaluation Tier I: 1 Procedure     PT G Codes:        Shakea Isip, Tessie Fass 01/27/2015, 2:34 PM 01/27/2015  Donnella Sham, PT [phone]  (pager)

## 2015-01-27 NOTE — Progress Notes (Signed)
[facility] PROGRESS NOTE  Drystan Reader KDX:[id number] DOB: [birth year] DOA: 01/26/2015 PCP: Pcp Not In System  Assessment/Plan: 1-right temporal ischemic stroke: most likely due to right ICA occlusion  -will complete stroke workup -continue statins and risk factors modifications -ASA for secondary prevention -will check CTA neck and determine need for vascular surgery consultation -PT/OT/SPT to see patient  2-HTN: fairly stable in setting of acute ischemic process -will adjust antihypertensive regimen as needed -PRN hydralazine  3-right ICAO: will check CTA neck -also carotid dopplers pending -will follow results and will consult vascular surgery as needed -continue statins and ASA  4-GERD: continue PPI  5-HLD: LDL 171 -continue statins  6-tobacco abuse: cessation counseling provided -will use nicotine patch    Code Status: full Family Communication: no family at bedside Disposition Plan: most likely home when medically stable and stroke work up completed   Consultants:  Neurology   Procedures:  See below for x-ray reports  2-D echo: pending  Carotid dopplers: pending  Antibiotics:  None   HPI/Subjective: Feeling ok and currently AAOX3, no fever, no CP, no SOB.  Objective: Filed Vitals:   01/27/15 1300  BP: 167/88  Pulse:   Temp: 98.1 F (36.7 C)  Resp: 18    Intake/Output Summary (Last 24 hours) at 01/27/15 1346 Last data filed at 01/27/15 0416  Gross per 24 hour  Intake      0 ml  Output   1110 ml  Net  -1110 ml   Filed Weights   01/26/15 1338 01/26/15 2300  Weight: 79.379 kg (175 lb) 77.2 kg (170 lb 3.1 oz)    Exam:   General:  No fever, no CP, no SOB. Patient with no distress and currently AAOX3. No dysarthria, no focal motor deficit  Cardiovascular: S1 and S2, no rubs or gallops  Respiratory: CTA bilaterally  Abdomen: soft, NT, ND, positive BS  Musculoskeletal: no edema or cyanosis  Data Reviewed: Basic Metabolic  Panel:  Recent Labs Lab 01/26/15 1400  NA 138  K 4.4  CL 107  CO2 23  GLUCOSE 82  BUN 11  CREATININE 0.88  CALCIUM 8.6*   CBC:  Recent Labs Lab 01/26/15 1400  WBC 8.9  NEUTROABS 6.2  HGB 15.0  HCT 43.6  MCV 87.7  PLT 260   Studies: Dg Chest 2 View  01/27/2015   CLINICAL DATA:  64 year old male with stroke, right MCA infarct. Initial encounter.  EXAM: CHEST  2 VIEW  COMPARISON:  None.  FINDINGS: Low normal lung volumes. Mild eventration of the diaphragm. Normal cardiac size and mediastinal contours. Visualized tracheal air column is within normal limits. No pneumothorax, pulmonary edema, pleural effusion or confluent pulmonary opacity. No acute osseous abnormality identified.  IMPRESSION: No acute cardiopulmonary abnormality.   Electronically Signed   By: Genevie Ann M.D.   On: 01/27/2015 00:37   Ct Head Wo Contrast  01/26/2015   CLINICAL DATA:  Patient fell off region for object over head. Confusion after fall.  EXAM: CT HEAD WITHOUT CONTRAST  TECHNIQUE: Contiguous axial images were obtained from the base of the skull through the vertex without intravenous contrast.  COMPARISON:  None.  FINDINGS: The ventricles are normal in size and configuration. There is decreased attenuation in the anterior right parietal lobe extending to involve a portion of the lateral superior right occipital lobe and posterior superior right temporal lobe. This appearance is consistent with an acute infarct. There is cytotoxic edema in this area. There is no other mass effect. There  is no hemorrhage or extra-axial fluid. No midline shift. Elsewhere, there is slight small vessel disease in the centra semiovale bilaterally.  Bony calvarium appears intact.  The mastoid air cells are clear.  IMPRESSION: Findings consistent with an acute infarct involving portions of the posterior superior right temporal lobe, lateral right occipital lobe and a portion of the right parietal lobe. Although felt to be less likely, edema  from an underlying neoplasm potentially could present in this manner. Brain MR to differentiate between these entities would be reasonable. Note that there is no hemorrhage or midline shift. No extra-axial fluid collections are identified.  Elsewhere, there is rather minimal periventricular small vessel disease.  These results were called by telephone at the time of interpretation on 01/26/2015 at 2:49 pm to Dr. Tanna Furry , who verbally acknowledged these results.   Electronically Signed   By: Lowella Grip III M.D.   On: 01/26/2015 14:49   Mr Ray Williams Head Wo Contrast  01/26/2015   CLINICAL DATA:  Abnormal CT.  Headache and hypertension.  EXAM: MRI HEAD WITHOUT CONTRAST  MRA HEAD WITHOUT CONTRAST  TECHNIQUE: Multiplanar, multiecho pulse sequences of the brain and surrounding structures were obtained without intravenous contrast. Angiographic images of the head were obtained using MRA technique without contrast.  COMPARISON:  CT head 01/24/2015  FINDINGS: MRI HEAD FINDINGS  Acute infarct in the right temporoparietal lobe involving the posterior insula and parietal lobe. This corresponds to the abnormality on CT. Thrombus is noted in the parietal branch of the right middle cerebral artery. The right internal carotid artery is occluded.  Chronic infarct in the right frontal cortex. No other areas of chronic ischemia.  No intracranial hemorrhage.  Negative for mass or edema.  Ventricle size normal.  No shift of the midline structures.  Pituitary normal in size.  Mucosal edema in the paranasal sinuses.  MRA HEAD FINDINGS  Both vertebral arteries patent to the basilar. PICA patent bilaterally. Small fenestration proximal basilar. Basilar widely patent. Superior cerebellar and posterior cerebral arteries widely patent  Right internal carotid artery is occluded through the cavernous segment. Reconstitution of supraclinoid internal carotid artery. Severe stenosis right A1 segment. Right A2 segment patent. Right M1 segment  is patent. There is occlusion of the parietal branch of the right middle cerebral artery. Right temporal branch is patent  Mild atherosclerotic disease left cavernous carotid. Mild stenosis left A1 segment. Moderate stenosis left M1 segment. Mild disease in the left middle cerebral artery branches.  IMPRESSION: Acute infarct right middle cerebral artery territory involving the posterior insula and right parietal lobe. Negative for hemorrhage  Occlusion of the right internal carotid artery. Occlusion of the parietal branch of the right middle cerebral artery.  Atherosclerotic disease in the anterior middle cerebral arteries bilaterally.   Electronically Signed   By: Franchot Gallo M.D.   On: 01/26/2015 18:13   Mr Brain Wo Contrast  01/26/2015   CLINICAL DATA:  Abnormal CT.  Headache and hypertension.  EXAM: MRI HEAD WITHOUT CONTRAST  MRA HEAD WITHOUT CONTRAST  TECHNIQUE: Multiplanar, multiecho pulse sequences of the brain and surrounding structures were obtained without intravenous contrast. Angiographic images of the head were obtained using MRA technique without contrast.  COMPARISON:  CT head 01/24/2015  FINDINGS: MRI HEAD FINDINGS  Acute infarct in the right temporoparietal lobe involving the posterior insula and parietal lobe. This corresponds to the abnormality on CT. Thrombus is noted in the parietal branch of the right middle cerebral artery. The right internal carotid artery  is occluded.  Chronic infarct in the right frontal cortex. No other areas of chronic ischemia.  No intracranial hemorrhage.  Negative for mass or edema.  Ventricle size normal.  No shift of the midline structures.  Pituitary normal in size.  Mucosal edema in the paranasal sinuses.  MRA HEAD FINDINGS  Both vertebral arteries patent to the basilar. PICA patent bilaterally. Small fenestration proximal basilar. Basilar widely patent. Superior cerebellar and posterior cerebral arteries widely patent  Right internal carotid artery is  occluded through the cavernous segment. Reconstitution of supraclinoid internal carotid artery. Severe stenosis right A1 segment. Right A2 segment patent. Right M1 segment is patent. There is occlusion of the parietal branch of the right middle cerebral artery. Right temporal branch is patent  Mild atherosclerotic disease left cavernous carotid. Mild stenosis left A1 segment. Moderate stenosis left M1 segment. Mild disease in the left middle cerebral artery branches.  IMPRESSION: Acute infarct right middle cerebral artery territory involving the posterior insula and right parietal lobe. Negative for hemorrhage  Occlusion of the right internal carotid artery. Occlusion of the parietal branch of the right middle cerebral artery.  Atherosclerotic disease in the anterior middle cerebral arteries bilaterally.   Electronically Signed   By: Franchot Gallo M.D.   On: 01/26/2015 18:13    Scheduled Meds: . aspirin EC  81 mg Oral Daily  . atorvastatin  40 mg Oral q1800  . enoxaparin (LOVENOX) injection  40 mg Subcutaneous Q24H  . nicotine  14 mg Transdermal Daily   Continuous Infusions:   Principal Problem:   Stroke Active Problems:   ICAO (internal carotid artery occlusion)   Essential hypertension   Time spent: 30 minutes   Barton Dubois  [facility] Pager [phone]. If 7PM-7AM, please contact night-coverage at [email or web address], password [email or web address] 01/27/2015, 1:46 PM  LOS: 1 day

## 2015-01-28 ENCOUNTER — Ambulatory Visit (HOSPITAL_COMMUNITY): Payer: Self-pay

## 2015-01-28 ENCOUNTER — Inpatient Hospital Stay (HOSPITAL_COMMUNITY): Payer: Self-pay

## 2015-01-28 DIAGNOSIS — I6789 Other cerebrovascular disease: Secondary | ICD-10-CM

## 2015-01-28 DIAGNOSIS — Z8673 Personal history of transient ischemic attack (TIA), and cerebral infarction without residual deficits: Secondary | ICD-10-CM | POA: Insufficient documentation

## 2015-01-28 DIAGNOSIS — I63511 Cerebral infarction due to unspecified occlusion or stenosis of right middle cerebral artery: Secondary | ICD-10-CM

## 2015-01-28 LAB — HEMOGLOBIN A1C
Hgb A1c MFr Bld: 5.8 % — ABNORMAL HIGH (ref 4.8–5.6)
MEAN PLASMA GLUCOSE: 120 mg/dL

## 2015-01-28 MED ORDER — CLOPIDOGREL BISULFATE 75 MG PO TABS
75.0000 mg | ORAL_TABLET | Freq: Every day | ORAL | Status: DC
  Administered 2015-01-28: 75 mg via ORAL
  Filled 2015-01-28: qty 1

## 2015-01-28 MED ORDER — CLOPIDOGREL BISULFATE 75 MG PO TABS: 75.0000 mg | ORAL_TABLET | Freq: Every day | ORAL | Status: DC

## 2015-01-28 MED ORDER — OMEPRAZOLE MAGNESIUM 20 MG PO TBEC: 20.0000 mg | DELAYED_RELEASE_TABLET | Freq: Every day | ORAL | Status: DC

## 2015-01-28 MED ORDER — HYDROCHLOROTHIAZIDE 25 MG PO TABS: 25.0000 mg | ORAL_TABLET | Freq: Every day | ORAL | Status: DC

## 2015-01-28 MED ORDER — ATORVASTATIN CALCIUM 40 MG PO TABS: 40.0000 mg | ORAL_TABLET | Freq: Every day | ORAL | Status: DC

## 2015-01-28 MED ORDER — ASPIRIN 81 MG PO TBEC: 81.0000 mg | DELAYED_RELEASE_TABLET | Freq: Every day | ORAL | Status: DC

## 2015-01-28 MED ORDER — LISINOPRIL 20 MG PO TABS: 20.0000 mg | ORAL_TABLET | Freq: Every evening | ORAL | Status: DC

## 2015-01-28 MED ORDER — NICOTINE 14 MG/24HR TD PT24: 14.0000 mg | MEDICATED_PATCH | Freq: Every day | TRANSDERMAL | Status: DC

## 2015-01-28 MED ORDER — LISINOPRIL 20 MG PO TABS
20.0000 mg | ORAL_TABLET | Freq: Every day | ORAL | Status: DC
  Administered 2015-01-28: 20 mg via ORAL
  Filled 2015-01-28: qty 1

## 2015-01-28 NOTE — Progress Notes (Signed)
Utilization Review Completed.Ray Williams T6/20/2016  

## 2015-01-28 NOTE — Progress Notes (Signed)
Echocardiogram 2D Echocardiogram has been performed.  Tresa Res 01/28/2015, 2:31 PM

## 2015-01-28 NOTE — Progress Notes (Signed)
STROKE TEAM PROGRESS NOTE   HISTORY Ray Williams is an 64 y.o. male with a history of hypertension transferred to [facility] from [facility] for evaluation of speech difficulties, headache (resolved), AMS, and balance issues. Most of the history was obtained from the patient's family including his wife, daughter, and son-in-law. Apparently the patient has been having difficulties with severe hypertension recently. He was seen at the urgent care center and by his physician and prescribed Lotensin as well as hydrochlorothiazide. He does not take his medications as instructed because they make him feel bad.   He had to have a dental extraction earlier this week however the procedure was postponed secondary to elevated blood pressures. He returned on Thursday of this week and had 3 teeth pulled and was placed on amoxicillin, ibuprofen, and hydrocodone. On Friday he did not feel well; however, the family attributed this to having had his teeth pulled. When he went to bed last night he essentially seemed normal.   This morning he awoke at 7 AM with a severe headache. It was located over his right eye. He described it as dull and a 7 on a 1-10 scale. It lasted approximately one hour. He took several ibuprofen without relief. He denies taking any hydrocodone today. The patient's daughter reports that he had cluster headaches 3-4 years ago although these resolved. Today at approximately 10:30 the patient started to act strange, lethargic ,and was felt to be confused. He also had some balance problems and fell into a cabinet and bruised his forehead. He was brought to the emergency department for further evaluation. A CT scan was performed that was consistent with an acute infarct. An MRI/MRA is pending. The emergency room department nurse examined him prior to his MRI and gave him an NIH score of 1.  Date last known well: Date: 01/25/2015 Time last known well: Unable to determine tPA Given: No:  Late presentation. Minimal deficits.   SUBJECTIVE (INTERVAL HISTORY) Multiple family members at bedside. They think he is not as verbal as usual. Seems tire. No headaches.  Speech fine.    OBJECTIVE Temp:  [98.1 F (36.7 C)-98.9 F (37.2 C)] 98.5 F (36.9 C) (06/20 0544) Pulse Rate:  [65-71] 67 (06/20 0544) Cardiac Rhythm:  [-] Normal sinus rhythm (06/19 2046) Resp:  [18-20] 20 (06/20 0544) BP: (150-167)/(60-88) 167/72 mmHg (06/20 0544) SpO2:  [94 %-98 %] 98 % (06/20 0544)  No results for input(s): GLUCAP in the last 168 hours.  Recent Labs Lab 01/26/15 1400  NA 138  K 4.4  CL 107  CO2 23  GLUCOSE 82  BUN 11  CREATININE 0.88  CALCIUM 8.6*   No results for input(s): AST, ALT, ALKPHOS, BILITOT, PROT, ALBUMIN in the last 168 hours.  Recent Labs Lab 01/26/15 1400  WBC 8.9  NEUTROABS 6.2  HGB 15.0  HCT 43.6  MCV 87.7  PLT 260   No results for input(s): CKTOTAL, CKMB, CKMBINDEX, TROPONINI in the last 168 hours. No results for input(s): LABPROT, INR in the last 72 hours. No results for input(s): COLORURINE, LABSPEC, [place], GLUCOSEU, HGBUR, BILIRUBINUR, KETONESUR, PROTEINUR, UROBILINOGEN, NITRITE, LEUKOCYTESUR in the last 72 hours.  Invalid input(s): APPERANCEUR     Component Value Date/Time   CHOL 234* 01/27/2015 0600   TRIG 158* 01/27/2015 0600   HDL 31* 01/27/2015 0600   CHOLHDL 7.5 01/27/2015 0600   VLDL 32 01/27/2015 0600   LDLCALC 171* 01/27/2015 0600   No results found for: HGBA1C No results found for:  LABOPIA, COCAINSCRNUR, LABBENZ, AMPHETMU, THCU, LABBARB  No results for input(s): ETH in the last 168 hours.   Dg Chest 2 View 01/27/2015    No acute cardiopulmonary abnormality.    Ct Head Wo Contrast 01/26/2015    Findings consistent with an acute infarct involving portions of the posterior superior right temporal lobe, lateral right occipital lobe and a portion of the right parietal lobe. Although felt to be less likely, edema from an underlying  neoplasm potentially could present in this manner. Brain MR to differentiate between these entities would be reasonable. Note that there is no hemorrhage or midline shift. No extra-axial fluid collections are identified.  Elsewhere, there is rather minimal periventricular small vessel disease.    Mri & Mra Head Wo Contrast 01/26/2015    Acute infarct right middle cerebral artery territory involving the posterior insula and right parietal lobe.  Negative for hemorrhage   Occlusion of the right internal carotid artery.  Occlusion of the parietal branch of the right middle cerebral artery.   Atherosclerotic disease in the anterior middle cerebral arteries bilaterally.     Carotid Doppler  There is 1-39% bilateral ICA stenosis. Vertebral artery flow is antegrade.    CTA neck  01/27/2015   Right internal carotid artery is occluded at the origin, probably due to acute thrombus or plaque rupture. Right internal carotid artery reconstitutes at the skullbase via collaterals. Severe stenosis origin right external carotid artery. Atherosclerotic plaque in the left carotid bifurcation without significant left carotid stenosis. Both vertebral arteries are patent to the basilar.   PHYSICAL EXAM GENERAL: No distress HEENT: normal. ABDOMEN: soft EXTREMITIES: No edema  BACK: normal SKIN: Normal by inspection. MENTAL STATUS: Alert  Follows commands briskly. Speech, language and cognition are generally fine. Judgment and insight fair.  CRANIAL NERVES: Pupils are equal, round and reactive to light and accomodation; extra ocular movements are full, there is no significant nystagmus; visual fields incongruent L homonomous hemianopia ; upper and lower facial muscles are normal in strength and symmetric, there is no flattening of the nasolabial folds; tongue is midline; uvula is midline; shoulder elevation is normal. MOTOR: Normal tone, bulk and strength; no pronator drift. Diminished fine finger movements on the  left. Mild weakness of left grip. Orbits right over left upper extremity. Mild weakness of left hip flexors and ankle dorsiflexors only. COORDINATION: Left finger to nose is normal, right finger to nose is normal, No rest tremor; no intention tremor; no postural tremor; no bradykinesia. SENSATION: Normal to light touch and temperature. GAIT : mild dragging of the left leg and requiring 1 person assist   ASSESSMENT/PLAN [name] is a 64 y.o. male with history of tobacco use, hypertension and recent dental extractions presenting with AMS. He did not receive IV t-PA due to late presentation and minimal deficits.  Stroke:  Non-dominant infarcts secondary to occlusion of the right internal carotid artery and the parietal branch of the right middle cerebral artery.  Resultant  AMS / abulia, L peripheral vision loss, mild L hand hemiparesis  MRI  Acute infarct right middle cerebral artery territory involving the posterior insula and right parietal lobe.  MRA  Occlusion of the right internal carotid artery.   Occlusion of the parietal branch of the right middle cerebral artery.    Carotid Doppler  No significant stenosis   CTA neck R ICA occlusion at the origin  2D Echo  pending   LDL 171  HgbA1c pending   Lovenox for VTE prophylaxis  Diet Heart Room service appropriate?: Yes; Fluid consistency:: Thin  no antithrombotic prior to admission, now on aspirin 81 mg orally every day.  Given large vessel intracranial atherosclerosis, patient should be treated with aspirin 81 mg and clopidogrel 75 mg orally every day x 3 months for secondary stroke prevention. After 3 months, change to plavix alone. Long-term dual antiplatelets are contraindicated due to risk for intracerebral hemorrhage.    Patient counseled to be compliant with his antithrombotic medications  Ongoing aggressive stroke risk factor management  Therapy recommendations: OP PT, OP PT  Disposition:  Return home with OP  therapies  Patient has a 10-15% risk of having another stroke over the next year, the highest risk is within 2 weeks of the most recent stroke/TIA (risk of having a stroke following a stroke or TIA is the same). Given collaterals providing blood flow to brain, patient should avoid dehydration. Should drink 8 glasss of fluids/day. Avoid ETOH given risk of dehydration. No driving until vision returns or cleared by therapy Ongoing risk factor control by Primary Care Physician Follow-up Stroke Clinic at [facility] with Dr. Antony Contras in 2 months, order placed.  Hypertension  Home meds:  Lisinopril and hydrochlorothiazide Permissive hypertension <220/120 for 24-48 hours and then gradually normalize within 5-7 days Patient counseled to be compliant with his blood pressure medications  Hyperlipidemia  Home meds:  No lipid lowering medications prior to admission  LDL 171, goal < 70  Add Lipitor 40 mg daily  Continue statin at discharge  Other Stroke Risk Factors  Advanced age  Cigarette smoker, advised to stop smoking. He plans to use the patch, OTC. Wife also advised to stop smoking.  ETOH use  Other Active Problems  Smoking cessation  Other Pertinent History  Remote history of cluster headaches  Hospital day # [place] for Pager information 01/28/2015 11:22 AM  I have personally examined this patient, reviewed notes, independently viewed imaging studies, participated in medical decision making and plan of care. I have made any additions or clarifications directly to the above note. Agree with note above.  Recommend aspirin and Plavix to 3 months and then discontinue aspirin. Patient and wife both counseled to quit smoking.  Antony Contras, MD Medical Director [facility] Stroke Center Pager: [phone] 01/28/2015 3:57 PM  To contact Stroke Continuity provider, please refer to [email or web address] After hours, contact  General Neurology

## 2015-01-28 NOTE — Care Management Note (Signed)
Case Management Note  Patient Details  Name: Ray Williams MRN: [id number] Date of Birth: 1950/12/13  Subjective/Objective:                    Action/Plan:  Met with patient to discuss discharge needs. Patient is currently without insurance and has been contacted by Caryl Pina in [employer].  Patient does not have a PCP, so he was provided with the [facility] number as well as contact information for [facility].  Patient denies any difficulty obtaining medications. CM will continue to follow for any additional discharge needs. Expected Discharge Date:                  Expected Discharge Plan:  Home/Self Care  In-House Referral:  Development worker, community, PCP / [facility]  Discharge planning Services  CM Consult  Post Acute Care Choice:    Choice offered to:     DME Arranged:    DME Agency:     HH Arranged:    [place] Agency:     Status of Service:  Completed, signed off  Medicare Important Message Given:    Date Medicare IM Given:    Medicare IM give by:    Date Additional Medicare IM Given:    Additional Medicare Important Message give by:     If discussed at [place] of Stay Meetings, dates discussed:    Additional Comments:  Rolm Baptise, RN 01/28/2015, 1:49 PM

## 2015-01-28 NOTE — Evaluation (Signed)
Occupational Therapy Evaluation Patient Details Name: Navarro Nine MRN: [id number] DOB: 06/10/1951 Today's Date: 01/28/2015    History of Present Illness pt admitted with R sided HA, confusion and slurred speech.   CT/MRI shows R MCA territory infarct and carotic art occ.   Clinical Impression   Pt denies sensation or vision changes; son-in-law concerned about decreased peripheral vision. Pt able to scan environment for items needed at sink level. Pt requires multiple VC's for sequencing simple tasks. Education provided to wife and family on supervising Pt during ADLs and providing cues for sequencing as needed. 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 MD requested to discuss driving with Pt and family. Pt would benefit from skilled OT for increased safety and independence with ADL completion for planned d/c home.    Follow Up Recommendations  Outpatient OT    Equipment Recommendations  None recommended by OT    Recommendations for Other Services Other (comment) (Nutritional consult for menu planning)     Precautions / Restrictions Precautions Precautions: Fall Precaution Comments: visual deficits Restrictions Weight Bearing Restrictions: No      Mobility Bed Mobility               General bed mobility comments: Pt sitting EOB upon arrival (family present)  Transfers Overall transfer level: Needs assistance Equipment used: None Transfers: Sit to/from Stand Sit to Stand: Supervision         General transfer comment: increased sway upon standing, no physical assist needed    Balance Overall balance assessment: Modified Independent Sitting-balance support: Feet supported;No upper extremity supported Sitting balance-Leahy Scale: Fair     Standing balance support: No upper extremity supported Standing balance-Leahy Scale: Fair                               ADL Overall ADL's : Needs assistance/impaired Eating/Feeding: Independent;Sitting   Grooming: Wash/dry hands;Wash/dry face;Brushing hair;Minimal assistance;Cueing for sequencing;Standing Grooming Details (indicate cue type and reason): Pt required VC's to sequence grooming tasks at sink Upper Body Bathing: Minimal assitance;Cueing for sequencing;Standing Upper Body Bathing Details (indicate cue type and reason): VC's for sequence to remove gown before bathing upper body Lower Body Bathing: Minimal assistance;Sit to/from stand;Cueing for sequencing   Upper Body Dressing : Minimal assistance;Cueing for sequencing;Standing Upper Body Dressing Details (indicate cue type and reason): VC's for donning shirt and pulling shirt down over abdomen. Pt unaware that shirt is rolled above abdomen. Lower Body Dressing: Minimal assistance;Cueing for sequencing;Sit to/from stand Lower Body Dressing Details (indicate cue type and reason): VC's to button/ zip shorts Toilet Transfer: Supervision/safety;Ambulation   Toileting- Clothing Manipulation and Hygiene: Supervision/safety;Sit to/from stand;Cueing for sequencing   Tub/ Shower Transfer: Walk-in shower;Min guard;Shower seat;Ambulation   Functional mobility during ADLs: Supervision/safety;Cueing for sequencing General ADL Comments: Pt required multiple VC's for sequencing; including cues to walk into bathroom to complete tasks. Pt wife A with ADLs, education provided to allow Pt to complete task and wife to provide cuing as needed to redirect and assist with sequencing.     Vision Vision Assessment?: No apparent visual deficits (Pt able to scan counter at sink to find needed items) Additional Comments: Pt able to scan environment to find needed items for task; son in law reports Pt has decreased peripheral vision; wife reports no change.   Perception  Praxis      Pertinent Vitals/Pain Pain Assessment: No/denies pain     Hand  Dominance Right   Extremity/Trunk Assessment Upper Extremity Assessment Upper Extremity Assessment: Overall WFL for tasks assessed   Lower Extremity Assessment Lower Extremity Assessment: Overall WFL for tasks assessed       Communication Communication Communication: No difficulties   Cognition Arousal/Alertness: Awake/alert Behavior During Therapy: Flat affect Overall Cognitive Status: Impaired/Different from baseline Area of Impairment: Attention;Memory;Awareness;Problem solving   Current Attention Level: Selective Memory: Decreased short-term memory     Awareness: Intellectual Problem Solving: Slow processing;Difficulty sequencing;Requires verbal cues General Comments: Pt requires VC's for sequencing and redirecting to task   General Comments       Exercises       Shoulder Instructions      Home Living Family/patient expects to be discharged to:: Private residence Living Arrangements: Spouse/significant other Available Help at Discharge: Family;Available 24 hours/day Type of Home: House Home Access: Level entry     Home Layout: Multi-level;Able to live on main level with bedroom/bathroom     Bathroom Shower/Tub: Occupational psychologist: Standard     Home Equipment: Shower seat - built in;Hand held shower head   Additional Comments: 13 steps to "man cave" in basement      Prior Functioning/Environment Level of Independence: Independent        Comments: 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; has access to 7+ large vehicles    OT Diagnosis: Cognitive deficits;Disturbance of vision   OT Problem List: Impaired vision/perception;Decreased safety awareness;Decreased activity tolerance;Decreased cognition   OT Treatment/Interventions: Self-care/ADL training;Therapeutic exercise;Therapeutic activities;Cognitive remediation/compensation;Patient/family education;Visual/perceptual remediation/compensation    OT Goals(Current goals can be  found in the care plan section) Acute Rehab OT Goals Patient Stated Goal: to go home OT Goal Formulation: With patient Time For Goal Achievement: 02/11/15 Potential to Achieve Goals: Good ADL Goals Pt Will Perform Grooming: with modified independence;standing (sequence task) Pt Will Perform Upper Body Bathing: with modified independence;standing Pt Will Perform Lower Body Bathing: with modified independence;sit to/from stand Pt Will Perform Upper Body Dressing: with modified independence;standing Pt Will Perform Lower Body Dressing: with modified independence;sit to/from stand  OT Frequency: Min 3X/week   Barriers to D/C:            Co-evaluation              End of Session Equipment Utilized During Treatment: Gait belt Nurse Communication: Mobility status  Activity Tolerance: Patient tolerated treatment well Patient left: in chair;with call bell/phone within reach;with family/visitor present   Time: 9702-6378 OT Time Calculation (min): 23 min Charges:  OT General Charges $OT Visit: 1 Procedure OT Evaluation $Initial OT Evaluation Tier I: 1 Procedure G-Codes:    Forest Gleason 01/28/2015, 9:52 AM

## 2015-01-28 NOTE — Discharge Summary (Signed)
Physician Discharge Summary  Ray Williams YTK:[id number] DOB: 05-31-1951 DOA: 01/26/2015  PCP: Pcp Not In System  Admit date: 01/26/2015 Discharge date: 01/28/2015  Time spent: >30 minutes  Recommendations for Outpatient Follow-up:  1. Reassess BP and adjust antihypertensive regimen as needed 2. Check CMET to follow LFT's. Electrolytes and renal function due to new meds initiated for BP and cholesterol control  Discharge Diagnoses:  Right MCA stroke ICAO (internal carotid artery occlusion) Essential hypertension GERD Tobacco abuse HLD (hyperlipidemia) Alcohol abuse  Discharge Condition: stable and improved. Patient discharge home with instructions to follow with PCP and neurology.  Diet recommendation: low fat diet and heart healthy (low sodium diet)  Filed Weights   01/26/15 1338 01/26/15 2300  Weight: 79.379 kg (175 lb) 77.2 kg (170 lb 3.1 oz)    History of present illness:  64 y.o. male with a history of Uncontrolled HTN, who was seen at an [facility] in [place] due to Headache Confusion and Dysarthria since the AM of admission and sent via EMS to [facility] for a Stoke Workup. His family reports that he was last seen normal the night before admission and in the AM upon awakening he had complaints of a right Temporal area headache. At 10:45 his daughter called him on the telephone and he was not acting right, and his speech was slurred. He was confused. He had also hit the Left side of his head on the corner of a cabinet while trying to reach something.His family took him to a near by [facility] and was transferred via EMS to [facility], but he was outside of the window for TPA. He was seen by Neurology and a CT scan and MRI/MRA were performed which revealed and acute Right MCA Territory Infarct as well as occlusion of the Right Internal Carotid Artery.  Hospital Course:  1-Non-dominant infarcts secondary to occlusion of the right internal carotid artery and the parietal branch  of the right middle cerebral artery. -per neurology no intervention granted given the fact is 100% occluded  -will focus on secondary prevention -ASA and plavix for 3 months and then just plavix -patient started on lipitor as well -continue BP control and patient has been advise to stop smoking and minimize drinking -follow up with stroke-neurologist in 2 months -his A1C was 5.8  2-HTN: with medication non-compliance -will discharge on lisinopril and HCTZ -patient educated about heart healthy diet and low sodium intake  3-HLD: -LDL 171 (Goal is less than 70) -started and discharge on lipitor -advise to follow low fat diet  4-tobacco abuse: -cessation counseling provided -patient discharge on nicotine patch  5-alcohol abuse: -cessation counseling provided -patient is looking to quit -instructed to use MV on daily basis to supplement thiamine and folic acid  6-GERD/GI protection: will recommend the use of prilosec on daily basis -patient encourage to quit smoking and drinking alcohol   Procedures:  Carotid dopplers: with right ICA occlusion, mild LEFT ICA stenosis  MRI acute infarct right middle cerebral artery territory involving posterior insula and right parietal lobe  MRA occlusion of the right internal carotid artery  CTA neck: Right ICA occlusion at the origin  2-D echo: with preserved EF, no wall motions abnormalities and no evident source of emboli   Consultations:  Neurology   Discharge Exam: Filed Vitals:   01/28/15 1719  BP: 149/65  Pulse: 70  Temp: 98.6 F (37 C)  Resp: 20    General: AAOX3, afebrile, good insight and normal judgement, no facial droop and  no dysarthria. Cardiovascular: S1 and S2, no rubs, no gallops, no murmurs Respiratory: good air movement, no wheezing or crackles Abd: soft, NT, ND, positive BS Extremities: no edema, no cyanosis Neuro: AAOX3, mild left hemiparesis, normal finger to nose, MS 5/5 upper and lower extremities on  the right and 4/5 on the left extremities; patient with normal finger to nose and no dysarthria   Discharge Instructions   Discharge Instructions    Ambulatory referral to Neurology    Complete by:  As directed   Please schedule post stroke follow up in 2 months.     Diet - low sodium heart healthy    Complete by:  As directed      Discharge instructions    Complete by:  As directed   Take medications as prescribed Please arrange follow up with PCP in 2 weeks Be compliant with visit in 2 months with neurologist Follow heart healthy diet (less than 2500mg  of sodium per day) and low fat diet Stop smoking and minimize to no more than 1-2 drinks (of alcohol) per week Maintain a good hydration (at least half of your weight in ounces of water everyday)          Current Discharge Medication List    START taking these medications   Details  aspirin EC 81 MG EC tablet Take 1 tablet (81 mg total) by mouth daily. To be taken along plavix for 3 months. After that only plavix. Qty: 30 tablet, Refills: 3    atorvastatin (LIPITOR) 40 MG tablet Take 1 tablet (40 mg total) by mouth daily at 6 PM. Qty: 30 tablet, Refills: 1    clopidogrel (PLAVIX) 75 MG tablet Take 1 tablet (75 mg total) by mouth daily. To be taken on daily basis along with aspirin for 3 months; then just plavix. Qty: 30 tablet, Refills: 3    nicotine (NICODERM CQ - DOSED IN MG/24 HOURS) 14 mg/24hr patch Place 1 patch (14 mg total) onto the skin daily. Qty: 28 patch, Refills: 0    omeprazole (PRILOSEC OTC) 20 MG tablet Take 1 tablet (20 mg total) by mouth daily. Qty: 30 tablet, Refills: 1      CONTINUE these medications which have CHANGED   Details  hydrochlorothiazide (HYDRODIURIL) 25 MG tablet Take 1 tablet (25 mg total) by mouth daily. Qty: 30 tablet, Refills: 1    lisinopril (PRINIVIL,ZESTRIL) 20 MG tablet Take 1 tablet (20 mg total) by mouth every evening. Qty: 30 tablet, Refills: 1      CONTINUE these  medications which have NOT CHANGED   Details  HYDROcodone-acetaminophen (NORCO/VICODIN) 5-325 MG per tablet Take 1 tablet by mouth every 6 (six) hours as needed for moderate pain.      STOP taking these medications     amoxicillin-clavulanate (AUGMENTIN) 875-125 MG per tablet      ibuprofen (ADVIL,MOTRIN) 800 MG tablet      naproxen sodium (ANAPROX) 220 MG tablet        No Known Allergies Follow-up Information    Follow up with SETHI,PRAMOD, MD In 2 months.   Specialties:  Neurology, Radiology   Why:  Stroke Clinic, Office will call you with appointment date & time   Contact information:   [address] [place] [address] [phone]       The results of significant diagnostics from this hospitalization (including imaging, microbiology, ancillary and laboratory) are listed below for reference.    Significant Diagnostic Studies: Dg Chest 2 View  01/27/2015  CLINICAL DATA:  64 year old male with stroke, right MCA infarct. Initial encounter.  EXAM: CHEST  2 VIEW  COMPARISON:  None.  FINDINGS: Low normal lung volumes. Mild eventration of the diaphragm. Normal cardiac size and mediastinal contours. Visualized tracheal air column is within normal limits. No pneumothorax, pulmonary edema, pleural effusion or confluent pulmonary opacity. No acute osseous abnormality identified.  IMPRESSION: No acute cardiopulmonary abnormality.   Electronically Signed   By: Genevie Ann M.D.   On: 01/27/2015 00:37   Ct Head Wo Contrast  01/26/2015   CLINICAL DATA:  Patient fell off region for object over head. Confusion after fall.  EXAM: CT HEAD WITHOUT CONTRAST  TECHNIQUE: Contiguous axial images were obtained from the base of the skull through the vertex without intravenous contrast.  COMPARISON:  None.  FINDINGS: The ventricles are normal in size and configuration. There is decreased attenuation in the anterior right parietal lobe extending to involve a portion of the lateral superior right  occipital lobe and posterior superior right temporal lobe. This appearance is consistent with an acute infarct. There is cytotoxic edema in this area. There is no other mass effect. There is no hemorrhage or extra-axial fluid. No midline shift. Elsewhere, there is slight small vessel disease in the centra semiovale bilaterally.  Bony calvarium appears intact.  The mastoid air cells are clear.  IMPRESSION: Findings consistent with an acute infarct involving portions of the posterior superior right temporal lobe, lateral right occipital lobe and a portion of the right parietal lobe. Although felt to be less likely, edema from an underlying neoplasm potentially could present in this manner. Brain MR to differentiate between these entities would be reasonable. Note that there is no hemorrhage or midline shift. No extra-axial fluid collections are identified.  Elsewhere, there is rather minimal periventricular small vessel disease.  These results were called by telephone at the time of interpretation on 01/26/2015 at 2:49 pm to Dr. Tanna Furry , who verbally acknowledged these results.   Electronically Signed   By: Lowella Grip III M.D.   On: 01/26/2015 14:49   Ct Angio Neck W/cm &/or Wo/cm  01/27/2015   CLINICAL DATA:  Stroke  EXAM: CT ANGIOGRAPHY NECK  TECHNIQUE: Multidetector CT imaging of the neck was performed using the standard protocol during bolus administration of intravenous contrast. Multiplanar CT image reconstructions and MIPs were obtained to evaluate the vascular anatomy. Carotid stenosis measurements (when applicable) are obtained utilizing NASCET criteria, using the distal internal carotid diameter as the denominator.  CONTRAST:  49mL OMNIPAQUE IOHEXOL 350 MG/ML SOLN  COMPARISON:  MR and  MRA head 01/26/2015  FINDINGS: Aortic arch: Mild atherosclerotic disease in the aortic arch without aneurysm or dissection. Proximal great vessels widely patent. Lung apices are clear.  Right carotid system: Right  common carotid artery shows diffuse atherosclerotic thickening without flow limiting stenosis. Occlusion of the right internal carotid artery at the origin. There is irregular plaque in the area and this is likely an acute occlusion. Severe stenosis right external carotid artery origin. The right internal carotid artery reconstitutes at the skullbase via collaterals.  Left carotid system: Atherosclerotic plaque throughout the left common carotid artery with mild stenosis in the midportion and at the origin. Atherosclerotic calcified plaque at the carotid bifurcation without significant stenosis.  Vertebral arteries:Both vertebral arteries patent to the basilar without significant stenosis or dissection. PICA patent bilaterally.  Skeleton: Negative  Other neck: Negative  IMPRESSION: Right internal carotid artery is occluded at the origin, probably due to  acute thrombus or plaque rupture. Right internal carotid artery reconstitutes at the skullbase via collaterals. Severe stenosis origin right external carotid artery.  Atherosclerotic plaque in the left carotid bifurcation without significant left carotid stenosis. Both vertebral arteries are patent to the basilar.   Electronically Signed   By: Franchot Gallo M.D.   On: 01/27/2015 16:08   [name] Head Wo Contrast  01/26/2015   CLINICAL DATA:  Abnormal CT.  Headache and hypertension.  EXAM: MRI HEAD WITHOUT CONTRAST  MRA HEAD WITHOUT CONTRAST  TECHNIQUE: Multiplanar, multiecho pulse sequences of the brain and surrounding structures were obtained without intravenous contrast. Angiographic images of the head were obtained using MRA technique without contrast.  COMPARISON:  CT head 01/24/2015  FINDINGS: MRI HEAD FINDINGS  Acute infarct in the right temporoparietal lobe involving the posterior insula and parietal lobe. This corresponds to the abnormality on CT. Thrombus is noted in the parietal branch of the right middle cerebral artery. The right internal carotid artery  is occluded.  Chronic infarct in the right frontal cortex. No other areas of chronic ischemia.  No intracranial hemorrhage.  Negative for mass or edema.  Ventricle size normal.  No shift of the midline structures.  Pituitary normal in size.  Mucosal edema in the paranasal sinuses.  MRA HEAD FINDINGS  Both vertebral arteries patent to the basilar. PICA patent bilaterally. Small fenestration proximal basilar. Basilar widely patent. Superior cerebellar and posterior cerebral arteries widely patent  Right internal carotid artery is occluded through the cavernous segment. Reconstitution of supraclinoid internal carotid artery. Severe stenosis right A1 segment. Right A2 segment patent. Right M1 segment is patent. There is occlusion of the parietal branch of the right middle cerebral artery. Right temporal branch is patent  Mild atherosclerotic disease left cavernous carotid. Mild stenosis left A1 segment. Moderate stenosis left M1 segment. Mild disease in the left middle cerebral artery branches.  IMPRESSION: Acute infarct right middle cerebral artery territory involving the posterior insula and right parietal lobe. Negative for hemorrhage  Occlusion of the right internal carotid artery. Occlusion of the parietal branch of the right middle cerebral artery.  Atherosclerotic disease in the anterior middle cerebral arteries bilaterally.   Electronically Signed   By: Franchot Gallo M.D.   On: 01/26/2015 18:13   Mr Brain Wo Contrast  01/26/2015   CLINICAL DATA:  Abnormal CT.  Headache and hypertension.  EXAM: MRI HEAD WITHOUT CONTRAST  MRA HEAD WITHOUT CONTRAST  TECHNIQUE: Multiplanar, multiecho pulse sequences of the brain and surrounding structures were obtained without intravenous contrast. Angiographic images of the head were obtained using MRA technique without contrast.  COMPARISON:  CT head 01/24/2015  FINDINGS: MRI HEAD FINDINGS  Acute infarct in the right temporoparietal lobe involving the posterior insula and  parietal lobe. This corresponds to the abnormality on CT. Thrombus is noted in the parietal branch of the right middle cerebral artery. The right internal carotid artery is occluded.  Chronic infarct in the right frontal cortex. No other areas of chronic ischemia.  No intracranial hemorrhage.  Negative for mass or edema.  Ventricle size normal.  No shift of the midline structures.  Pituitary normal in size.  Mucosal edema in the paranasal sinuses.  MRA HEAD FINDINGS  Both vertebral arteries patent to the basilar. PICA patent bilaterally. Small fenestration proximal basilar. Basilar widely patent. Superior cerebellar and posterior cerebral arteries widely patent  Right internal carotid artery is occluded through the cavernous segment. Reconstitution of supraclinoid internal carotid artery. Severe stenosis right  A1 segment. Right A2 segment patent. Right M1 segment is patent. There is occlusion of the parietal branch of the right middle cerebral artery. Right temporal branch is patent  Mild atherosclerotic disease left cavernous carotid. Mild stenosis left A1 segment. Moderate stenosis left M1 segment. Mild disease in the left middle cerebral artery branches.  IMPRESSION: Acute infarct right middle cerebral artery territory involving the posterior insula and right parietal lobe. Negative for hemorrhage  Occlusion of the right internal carotid artery. Occlusion of the parietal branch of the right middle cerebral artery.  Atherosclerotic disease in the anterior middle cerebral arteries bilaterally.   Electronically Signed   By: Franchot Gallo M.D.   On: 01/26/2015 18:13   Labs: Basic Metabolic Panel:  Recent Labs Lab 01/26/15 1400  NA 138  K 4.4  CL 107  CO2 23  GLUCOSE 82  BUN 11  CREATININE 0.88  CALCIUM 8.6*   CBC:  Recent Labs Lab 01/26/15 1400  WBC 8.9  NEUTROABS 6.2  HGB 15.0  HCT 43.6  MCV 87.7  PLT 260    Signed:  Barton Dubois  [facility] 01/28/2015, 5:57 PM

## 2015-01-28 NOTE — Progress Notes (Signed)
Physical Therapy Treatment Patient Details Name: Ray Williams MRN: [id number] DOB: 1950-12-27 Today's Date: 01/28/2015    History of Present Illness pt admitted with R sided HA, confusion and slurred speech.   CT/MRI shows R MCA territory infarct and carotic art occ.    PT Comments    Patient progressing with mobility but continues to demonstrate poor awareness, inattention and visual deficits. Patient and family educated on compensation techniques. Will continue to see and progress as tolerated. Increased frequency as patient with limited resources and would benefit from further educated.   Follow Up Recommendations  Outpatient PT;Supervision for mobility/OOB     Equipment Recommendations  None recommended by PT    Recommendations for Other Services       Precautions / Restrictions Precautions Precautions: Fall Precaution Comments: visual deficits Restrictions Weight Bearing Restrictions: No    Mobility  Bed Mobility               General bed mobility comments: Pt sitting EOB upon arrival (family present)  Transfers Overall transfer level: Needs assistance Equipment used: None Transfers: Sit to/from Stand Sit to Stand: Supervision         General transfer comment: increased sway upon standing, no physical assist needed  Ambulation/Gait Ambulation/Gait assistance: Min guard;Min assist Ambulation Distance (Feet): 310 Feet Assistive device: None Gait Pattern/deviations: Step-through pattern Gait velocity: slower Gait velocity interpretation: Below normal speed for age/gender General Gait Details: Guarded with ambulation, min guard to min assist at times during cognitive tasks. Patient with poor visual field resulting in several incidence of bumping into objects. Patient with poor awareness of deficits and field to the left. Max cues for scanning technique employed throughout session.   Stairs Stairs: Yes Stairs assistance: Min guard Stair Management:  One rail Right;Alternating pattern;Forwards Number of Stairs: 5 General stair comments: VCs for use of rail and safety   Wheelchair Mobility    Modified Rankin (Stroke Patients Only)       Balance Overall balance assessment: Modified Independent Sitting-balance support: Feet supported;No upper extremity supported Sitting balance-Leahy Scale: Fair     Standing balance support: No upper extremity supported Standing balance-Leahy Scale: Fair                      Cognition Arousal/Alertness: Awake/alert Behavior During Therapy: Flat affect Overall Cognitive Status: Impaired/Different from baseline Area of Impairment: Attention;Memory;Awareness;Problem solving   Current Attention Level: Selective Memory: Decreased short-term memory     Awareness: Intellectual Problem Solving: Slow processing;Difficulty sequencing;Requires verbal cues General Comments: Pt requires VC's for sequencing and redirecting to task    Exercises      General Comments General comments (skin integrity, edema, etc.): patient with continued Visual disturbances in L field that disrupt how pt interacts with his environment.  Inattention vs field cut.  Patient continuously bumping into objects despite cues for safety and scanning.  Family educated on compensation strategies..      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; has access  to 7+ large  vehicles   PT Goals (current goals can now be found in the care plan section) Acute Rehab PT Goals Patient Stated Goal: to go home Time For Goal Achievement: 02/03/15 Potential to Achieve Goals: Good Progress towards PT goals: Progressing toward goals    Frequency  Min 4X/week    PT Plan Current plan remains appropriate;Frequency needs to be updated    Co-evaluation             End of Session Equipment Utilized During Treatment: Gait belt Activity Tolerance: Patient tolerated treatment well Patient left: in bed;with call bell/phone within reach;with family/visitor present (sitting EOB with family)     Time: 2703-5009 PT Time Calculation (min) (ACUTE ONLY): 25 min  Charges:  $Gait Training: 8-22 mins $Self Care/Home Management: 8-22                    G CodesDuncan Dull 02/13/15, 9:19 AM Alben Deeds, PT DPT  [phone]

## 2015-02-21 ENCOUNTER — Encounter: Payer: Self-pay | Admitting: Family

## 2015-02-21 ENCOUNTER — Other Ambulatory Visit (INDEPENDENT_AMBULATORY_CARE_PROVIDER_SITE_OTHER): Payer: Self-pay

## 2015-02-21 ENCOUNTER — Ambulatory Visit (INDEPENDENT_AMBULATORY_CARE_PROVIDER_SITE_OTHER): Payer: Self-pay | Admitting: Family

## 2015-02-21 VITALS — BP 134/84 | HR 69 | Temp 98.5°F | Resp 18 | Ht 67.0 in | Wt 161.1 lb

## 2015-02-21 DIAGNOSIS — I639 Cerebral infarction, unspecified: Secondary | ICD-10-CM

## 2015-02-21 DIAGNOSIS — I10 Essential (primary) hypertension: Secondary | ICD-10-CM

## 2015-02-21 DIAGNOSIS — Z720 Tobacco use: Secondary | ICD-10-CM

## 2015-02-21 LAB — COMPREHENSIVE METABOLIC PANEL
ALK PHOS: 68 U/L (ref 39–117)
ALT: 32 U/L (ref 0–53)
AST: 14 U/L (ref 0–37)
Albumin: 4.3 g/dL (ref 3.5–5.2)
BILIRUBIN TOTAL: 0.7 mg/dL (ref 0.2–1.2)
BUN: 26 mg/dL — AB (ref 6–23)
CO2: 28 mEq/L (ref 19–32)
CREATININE: 1.23 mg/dL (ref 0.40–1.50)
Calcium: 10 mg/dL (ref 8.4–10.5)
Chloride: 102 mEq/L (ref 96–112)
GFR: 63.19 mL/min (ref >60.00)
Glucose, Bld: 96 mg/dL (ref 70–99)
Potassium: 4.3 mEq/L (ref 3.5–5.1)
Sodium: 138 mEq/L (ref 135–145)
Total Protein: 7.7 g/dL (ref 6.0–8.3)

## 2015-02-21 NOTE — Patient Instructions (Addendum)
Thank you for choosing [facility].  Summary/Instructions:  Your prescription(s) have been submitted to your pharmacy or been printed and provided for you. Please take as directed and contact our office if you believe you are having problem(s) with the medication(s) or have any questions.  Please stop by the lab on the basement level of the building for your blood work. Your results will be released to [place] (or called to you) after review, usually within 72 hours after test completion. If any changes need to be made, you will be notified at that same time.  If your symptoms worsen or fail to improve, please contact our office for further instruction, or in case of emergency go directly to the emergency room at the closest medical facility.   Please continue to take your medication as prescribed. Monitor your blood pressure at home.

## 2015-02-21 NOTE — Assessment & Plan Note (Signed)
Blood pressure is below goal of 140/90 with current regimen of lisinopril and hydrochlorothiazide. Obtain CMET. Monitor blood pressure at home as able. Continue current dosage of lisinopril and hydrochlorothiazide. Discussed conversion of medication to Zestoretic at next refill if he remains aysmptomatic. Follow up in 1 month.

## 2015-02-21 NOTE — Assessment & Plan Note (Addendum)
Has ceased tobacco use since onset of his stroke. Currently stable with use of nicotine patches. Continue patches as initiated. Does express some irritation and is in need of outlets of his frustration on occasion which is most likely related to the decrease in tobacco use. Follow-up if symptoms worsen or assistance as needed.

## 2015-02-21 NOTE — Progress Notes (Signed)
Subjective:    Patient ID: Ray Williams, male    DOB: 1951-04-04, 64 y.o.   MRN: [id number]  Chief Complaint  Patient presents with  . Establish Care    just stopped smoking in June, does get irritated which he thinks is from that, and feels anxious    HPI:  Ray Williams is a 64 y.o. male with a PMH of hypertension, and kidney stones who presents today for an office visit to establish care.    1.) Stroke - Recently seen in the ED and admitted to the hospital for a headache, hypertension and change in behavior. He indicated at the time that he had "not felt right" for approximately 12 hours. CT scan revealed findings consistent with an acute infarct. He was outside the window for tPa. MRI showed an acute infarct of the right middle cerebral territory and negative for hemorrhage. The CTA revealed right internal carotid artery occlusion possibly related to acute thrombus or plaque rupture. No intervention was performed and he was discharge on aspirin and plavix for anticoagulation. He was started on linisinopril and HCTZ for his blood pressure. Also started on atorvastatin for his cholesterol. All hospital records and imaging were reviewed in detail.   Since leaving the hospital he indicates that he has been doing "pretty good." Denies any residual effects from the stroke. Notes there is some issues with sleep. Describes difficulty staying asleep. Averages about 4-6 hours per night. There are no modifying factors that make it better or worse. He does fall asleep during the day on occasion.   2.) Hypertension - Started on medication in the hospital since having a stroke. Currently taking hydochlorothiazide and lisinopril. Denies any adverse effects of the medication and takes it as prescribed. Denies chest pain, shortness of breath, cough or palpitations. Takes his blood pressure at home and indicates it is in the 130s/80s on average.   BP Readings from Last 3 Encounters:  02/21/15 134/84    01/28/15 149/65    3.) Irritation - Associated symptom of irritation at night has been going on for a couple of weeks since he has stopped smoking. Believes this is due to his change in lifestyle.    No Known Allergies   Outpatient Prescriptions Prior to Visit  Medication Sig Dispense Refill  . aspirin EC 81 MG EC tablet Take 1 tablet (81 mg total) by mouth daily. To be taken along plavix for 3 months. After that only plavix. 30 tablet 3  . atorvastatin (LIPITOR) 40 MG tablet Take 1 tablet (40 mg total) by mouth daily at 6 PM. 30 tablet 1  . clopidogrel (PLAVIX) 75 MG tablet Take 1 tablet (75 mg total) by mouth daily. To be taken on daily basis along with aspirin for 3 months; then just plavix. 30 tablet 3  . hydrochlorothiazide (HYDRODIURIL) 25 MG tablet Take 1 tablet (25 mg total) by mouth daily. 30 tablet 1  . lisinopril (PRINIVIL,ZESTRIL) 20 MG tablet Take 1 tablet (20 mg total) by mouth every evening. 30 tablet 1  . nicotine (NICODERM CQ - DOSED IN MG/24 HOURS) 14 mg/24hr patch Place 1 patch (14 mg total) onto the skin daily. 28 patch 0  . omeprazole (PRILOSEC OTC) 20 MG tablet Take 1 tablet (20 mg total) by mouth daily. 30 tablet 1  . HYDROcodone-acetaminophen (NORCO/VICODIN) 5-325 MG per tablet Take 1 tablet by mouth every 6 (six) hours as needed for moderate pain.     No facility-administered medications prior to visit.  Past Medical History  Diagnosis Date  . Hypertension   . Kidney stones   . Stroke      History reviewed. No pertinent past surgical history.   Family History  Problem Relation Age of Onset  . Heart disease Mother   . Heart attack Mother   . Healthy Father   . Healthy Maternal Grandmother   . Healthy Maternal Grandfather   . Healthy Paternal Grandmother   . Healthy Paternal Grandfather      History   Social History  . Marital Status: Married    Spouse Name: N/A  . Number of Children: 2  . Years of Education: 12   Occupational History   . Grading Contractor    Social History Main Topics  . Smoking status: Former Smoker    Quit date: 01/26/2015  . Smokeless tobacco: Never Used  . Alcohol Use: 0.0 oz/week    0 Standard drinks or equivalent per week     Comment: occasionally  . Drug Use: No  . Sexual Activity: Not on file   Other Topics Concern  . Not on file   Social History Narrative   Fun: Fish, collectable cars/classic cars   Denies religious beliefs effecting health care.      Review of Systems  Respiratory: Negative for chest tightness.   Cardiovascular: Negative for chest pain, palpitations and leg swelling.  Neurological: Negative for headaches.  Psychiatric/Behavioral: Positive for sleep disturbance and agitation.      Objective:    BP 134/84 mmHg  Pulse 69  Temp(Src) 98.5 F (36.9 C) (Oral)  Resp 18  Ht $R'5\' 7"'Hq$  (1.702 m)  Wt 161 lb 1.9 oz (73.084 kg)  BMI 25.23 kg/m2  SpO2 98% Nursing note and vital signs reviewed.  Physical Exam  Constitutional: He is oriented to person, place, and time. He appears well-developed and well-nourished. No distress.  Eyes: Conjunctivae and EOM are normal. Pupils are equal, round, and reactive to light.  Cardiovascular: Normal rate, regular rhythm, normal heart sounds and intact distal pulses.   Pulmonary/Chest: Effort normal and breath sounds normal.  Neurological: He is alert and oriented to person, place, and time. He has normal reflexes. He displays normal reflexes. No cranial nerve deficit. He exhibits normal muscle tone. Coordination normal.  Skin: Skin is warm and dry.  Psychiatric: He has a normal mood and affect. His behavior is normal. Judgment and thought content normal.       Assessment & Plan:   Problem List Items Addressed This Visit      Cardiovascular and Mediastinum   Stroke    Stable and anticoagulated with clopidogrel and aspirin. Discussed importance of establishing health goals of prevention of future strokes and maximizing  rehabilitation. Has minimal deficits at this time and neurological exam is mostly benign. Continue current plan to reduce risk factors for cardiovascular disease and follow up with neurology as indicated.       Essential hypertension - Primary    Blood pressure is below goal of 140/90 with current regimen of lisinopril and hydrochlorothiazide. Obtain CMET. Monitor blood pressure at home as able. Continue current dosage of lisinopril and hydrochlorothiazide. Discussed conversion of medication to Zestoretic at next refill if he remains aysmptomatic. Follow up in 1 month.        Relevant Orders   Comp Met (CMET) (Completed)     Other   Tobacco abuse    Has ceased tobacco use since onset of his stroke. Currently stable with use of nicotine patches.  Continue patches as initiated. Does express some irritation and is in need of outlets of his frustration on occasion which is most likely related to the decrease in tobacco use. Follow-up if symptoms worsen or assistance as needed.

## 2015-02-21 NOTE — Progress Notes (Signed)
Pre visit review using our clinic review tool, if applicable. No additional management support is needed unless otherwise documented below in the visit note. 

## 2015-02-21 NOTE — Assessment & Plan Note (Signed)
Stable and anticoagulated with clopidogrel and aspirin. Discussed importance of establishing health goals of prevention of future strokes and maximizing rehabilitation. Has minimal deficits at this time and neurological exam is mostly benign. Continue current plan to reduce risk factors for cardiovascular disease and follow up with neurology as indicated.

## 2015-02-25 ENCOUNTER — Telehealth: Payer: Self-pay | Admitting: Family

## 2015-02-25 NOTE — Telephone Encounter (Signed)
Wife states patient was in last week and seen Marya Amsler. Wife states that Marya Amsler offered to prescribe patient an antidepressant but patient refused.  Wife states she would like patient to have something prescribed.  Wife states patient uses [facility].

## 2015-02-25 NOTE — Telephone Encounter (Signed)
Is patient wishing to take medication? I cannot prescribe medications to him if does not wish to take it.

## 2015-02-26 NOTE — Telephone Encounter (Signed)
Left voicemail for patient to return my call. 

## 2015-02-26 NOTE — Telephone Encounter (Signed)
Spoke with wife.  She stated she would speak with her husband to see if he would like medication and if so have him call in.

## 2015-03-04 NOTE — Telephone Encounter (Signed)
Patient is calling for the results of his lab work. °

## 2015-03-13 ENCOUNTER — Telehealth: Payer: Self-pay | Admitting: Neurology

## 2015-03-13 NOTE — Telephone Encounter (Signed)
Please call patient, is having issues, disoriented

## 2015-03-13 NOTE — Telephone Encounter (Signed)
Agree with above plan. 

## 2015-03-13 NOTE — Telephone Encounter (Signed)
Patient states he is having confusion, racing thoughts, and anxiety which is causing him to be disoriented. He states he is having problems with his spouse that is causing these issues. He states he is stressed and his wife is causing him to feel worse. Pt went to visit PCP and was told physically he is fine. He thought feelings may be medication related. No suicidal thoughts. Advised pt to contact PCP or go to ED if symptoms worsen, will inform Dr. Leonie Man.

## 2015-03-13 NOTE — Telephone Encounter (Signed)
Pt is feeling very confused, does not feel like it is a stroke. Feels like it is anxiety related but is very scared. Please call and advise [phone]

## 2015-03-27 ENCOUNTER — Telehealth: Payer: Self-pay | Admitting: *Deleted

## 2015-03-27 MED ORDER — HYDROCHLOROTHIAZIDE 25 MG PO TABS: 25.0000 mg | ORAL_TABLET | Freq: Every day | ORAL | Status: DC

## 2015-03-27 MED ORDER — ATORVASTATIN CALCIUM 40 MG PO TABS: 40.0000 mg | ORAL_TABLET | Freq: Every day | ORAL | Status: DC

## 2015-03-27 MED ORDER — CLOPIDOGREL BISULFATE 75 MG PO TABS: 75.0000 mg | ORAL_TABLET | Freq: Every day | ORAL | Status: DC

## 2015-03-27 MED ORDER — ASPIRIN 81 MG PO TBEC: 81.0000 mg | DELAYED_RELEASE_TABLET | Freq: Every day | ORAL | Status: DC

## 2015-03-27 MED ORDER — OMEPRAZOLE 20 MG PO CPDR: 20.0000 mg | DELAYED_RELEASE_CAPSULE | Freq: Every day | ORAL | Status: DC

## 2015-03-27 MED ORDER — LISINOPRIL 20 MG PO TABS: 20.0000 mg | ORAL_TABLET | Freq: Every evening | ORAL | Status: DC

## 2015-03-27 NOTE — Telephone Encounter (Signed)
Left msg on triage husband is almost out on his meds. Requesting call back. Called pt wife back pt is needing refills on all meds. Verified pharmacy inform will send to [facility]. Sent all rx's electronically...Ray Williams

## 2015-04-03 ENCOUNTER — Encounter: Payer: Self-pay | Admitting: Physician Assistant

## 2015-04-05 ENCOUNTER — Encounter (HOSPITAL_COMMUNITY): Payer: Self-pay | Admitting: *Deleted

## 2015-04-05 ENCOUNTER — Emergency Department (HOSPITAL_COMMUNITY)
Admission: EM | Admit: 2015-04-05 | Discharge: 2015-04-05 | Disposition: A | Discharge status: Home / Self Care | Payer: Self-pay | Attending: Emergency Medicine | Admitting: Emergency Medicine

## 2015-04-05 DIAGNOSIS — I10 Essential (primary) hypertension: Secondary | ICD-10-CM | POA: Insufficient documentation

## 2015-04-05 DIAGNOSIS — R202 Paresthesia of skin: Secondary | ICD-10-CM

## 2015-04-05 DIAGNOSIS — Z87442 Personal history of urinary calculi: Secondary | ICD-10-CM | POA: Insufficient documentation

## 2015-04-05 DIAGNOSIS — K644 Residual hemorrhoidal skin tags: Secondary | ICD-10-CM | POA: Insufficient documentation

## 2015-04-05 DIAGNOSIS — Z8673 Personal history of transient ischemic attack (TIA), and cerebral infarction without residual deficits: Secondary | ICD-10-CM | POA: Insufficient documentation

## 2015-04-05 DIAGNOSIS — Z7982 Long term (current) use of aspirin: Secondary | ICD-10-CM | POA: Insufficient documentation

## 2015-04-05 DIAGNOSIS — Z87891 Personal history of nicotine dependence: Secondary | ICD-10-CM | POA: Insufficient documentation

## 2015-04-05 DIAGNOSIS — G459 Transient cerebral ischemic attack, unspecified: Secondary | ICD-10-CM | POA: Insufficient documentation

## 2015-04-05 DIAGNOSIS — Z7902 Long term (current) use of antithrombotics/antiplatelets: Secondary | ICD-10-CM | POA: Insufficient documentation

## 2015-04-05 DIAGNOSIS — Z79899 Other long term (current) drug therapy: Secondary | ICD-10-CM | POA: Insufficient documentation

## 2015-04-05 DIAGNOSIS — K6289 Other specified diseases of anus and rectum: Secondary | ICD-10-CM | POA: Insufficient documentation

## 2015-04-05 LAB — I-STAT CHEM 8, ED
BUN: 36 mg/dL — AB (ref 6–20)
CALCIUM ION: 1.12 mmol/L — AB (ref 1.13–1.30)
CREATININE: 1.4 mg/dL — AB (ref 0.61–1.24)
Chloride: 102 mmol/L (ref 101–111)
Glucose, Bld: 106 mg/dL — ABNORMAL HIGH (ref 65–99)
HCT: 45 % (ref 39.0–52.0)
HEMOGLOBIN: 15.3 g/dL (ref 13.0–17.0)
Potassium: 4.8 mmol/L (ref 3.5–5.1)
Sodium: 136 mmol/L (ref 135–145)
TCO2: 23 mmol/L (ref 0–100)

## 2015-04-05 LAB — DIFFERENTIAL
Basophils Absolute: 0 10*3/uL (ref 0.0–0.1)
Basophils Relative: 0 % (ref 0–1)
EOS ABS: 0.1 10*3/uL (ref 0.0–0.7)
EOS PCT: 1 % (ref 0–5)
LYMPHS ABS: 2.2 10*3/uL (ref 0.7–4.0)
LYMPHS PCT: 25 % (ref 12–46)
MONOS PCT: 6 % (ref 3–12)
Monocytes Absolute: 0.5 10*3/uL (ref 0.1–1.0)
NEUTROS PCT: 68 % (ref 43–77)
Neutro Abs: 6.1 10*3/uL (ref 1.7–7.7)

## 2015-04-05 LAB — CBC
HEMATOCRIT: 41.8 % (ref 39.0–52.0)
HEMOGLOBIN: 14 g/dL (ref 13.0–17.0)
MCH: 29.5 pg (ref 26.0–34.0)
MCHC: 33.5 g/dL (ref 30.0–36.0)
MCV: 88.2 fL (ref 78.0–100.0)
Platelets: 297 10*3/uL (ref 150–400)
RBC: 4.74 MIL/uL (ref 4.22–5.81)
RDW: 13.5 % (ref 11.5–15.5)
WBC: 8.9 10*3/uL (ref 4.0–10.5)

## 2015-04-05 LAB — COMPREHENSIVE METABOLIC PANEL
ALBUMIN: 4.4 g/dL (ref 3.5–5.0)
ALK PHOS: 68 U/L (ref 38–126)
ALT: 48 U/L (ref 17–63)
ANION GAP: 11 (ref 5–15)
AST: 21 U/L (ref 15–41)
BILIRUBIN TOTAL: 1 mg/dL (ref 0.3–1.2)
BUN: 32 mg/dL — AB (ref 6–20)
CALCIUM: 9.7 mg/dL (ref 8.9–10.3)
CO2: 23 mmol/L (ref 22–32)
Chloride: 101 mmol/L (ref 101–111)
Creatinine, Ser: 1.44 mg/dL — ABNORMAL HIGH (ref 0.61–1.24)
GFR calc Af Amer: 58 mL/min — ABNORMAL LOW (ref >60)
GFR, EST NON AFRICAN AMERICAN: 50 mL/min — AB (ref >60)
GLUCOSE: 109 mg/dL — AB (ref 65–99)
POTASSIUM: 4.5 mmol/L (ref 3.5–5.1)
Sodium: 135 mmol/L (ref 135–145)
TOTAL PROTEIN: 7.5 g/dL (ref 6.5–8.1)

## 2015-04-05 LAB — I-STAT TROPONIN, ED: TROPONIN I, POC: 0 ng/mL (ref 0.00–0.08)

## 2015-04-05 LAB — PROTIME-INR
INR: 0.97 (ref 0.00–1.49)
Prothrombin Time: 13.1 seconds (ref 11.6–15.2)

## 2015-04-05 LAB — POC OCCULT BLOOD, ED: Fecal Occult Bld: NEGATIVE

## 2015-04-05 LAB — APTT: aPTT: 30 seconds (ref 24–37)

## 2015-04-05 MED ORDER — SODIUM CHLORIDE 0.9 % IV BOLUS (SEPSIS)
1000.0000 mL | Freq: Once | INTRAVENOUS | Status: AC
  Administered 2015-04-05: 1000 mL via INTRAVENOUS

## 2015-04-05 MED ORDER — HYDROCORTISONE ACETATE 25 MG RE SUPP
25.0000 mg | Freq: Once | RECTAL | Status: AC
  Administered 2015-04-05: 25 mg via RECTAL
  Filled 2015-04-05: qty 1

## 2015-04-05 NOTE — ED Provider Notes (Signed)
CSN: [id number]     Arrival date & time 04/05/15  1235 History   First MD Initiated Contact with Patient 04/05/15 1319     Chief Complaint  Patient presents with  . Numbness  . Constipation     (Consider location/radiation/quality/duration/timing/severity/associated sxs/prior Treatment) HPI   64 year old male with history of prior stroke, hypertension, kidney stones presenting for evaluation of constipation and right-sided tingling sensation. Patient reports he was diagnosed with having a stroke on June 18 when he was found to have elevated blood pressure in the 025E systolics with a severe headache. He did develop left visual deficit secondary to his stroke which has since improved. For the past 5 days he has been having constipation. States he has rectal pain when having bowel movement, producing only a small amount but able to pass flatus. Report occasional abdominal cramping but no significant abdominal pain. He was seen by his PCP yesterday and the day before for this complaint. He was told that he has hemorrhoid and was prescribed both suppository and rectal cream which she has been using but it provided no adequate relief. His primary concern is his rectal pain. Furthermore for the past 5 days he has had intermittent right sided tingling sensation affecting the right side of face, right arm and right leg. Symptoms lasting for seconds to minutes and usually worsen with stress. He denies having any severe headache, vision changes, nausea, vomiting, neck pain, chest pain, short of breath, back pain, nausea or vomiting or dysuria. He takes baby aspirin on a daily basis. He reports abnormal weight loss for the past 2 months from 170-154 despite eating normally. His primary care doctor is aware. He has not had a chance of follow-up with a neurologist yet. He has no history of active cancer. He is currently on Plavix, and denies any abnormal bleeding.  Past Medical History  Diagnosis Date  .  Hypertension   . Kidney stones   . Stroke    History reviewed. No pertinent past surgical history. Family History  Problem Relation Age of Onset  . Heart disease Mother   . Heart attack Mother   . Healthy Father   . Healthy Maternal Grandmother   . Healthy Maternal Grandfather   . Healthy Paternal Grandmother   . Healthy Paternal Grandfather    Social History  Substance Use Topics  . Smoking status: Former Smoker    Quit date: 01/26/2015  . Smokeless tobacco: Never Used  . Alcohol Use: 0.0 oz/week    0 Standard drinks or equivalent per week     Comment: occasionally    Review of Systems  All other systems reviewed and are negative.     Allergies  Review of patient's allergies indicates no known allergies.  Home Medications   Prior to Admission medications   Medication Sig Start Date End Date Taking? Authorizing Provider  aspirin 81 MG EC tablet Take 1 tablet (81 mg total) by mouth daily. To be taken along plavix for 3 months. After that only plavix. 03/27/15   Golden Circle, FNP  atorvastatin (LIPITOR) 40 MG tablet Take 1 tablet (40 mg total) by mouth daily at 6 PM. 03/27/15   Golden Circle, FNP  clopidogrel (PLAVIX) 75 MG tablet Take 1 tablet (75 mg total) by mouth daily. To be taken on daily basis along with aspirin for 3 months; then just plavix. 03/27/15   Golden Circle, FNP  hydrochlorothiazide (HYDRODIURIL) 25 MG tablet Take 1 tablet (25 mg total) by  mouth daily. 03/27/15   Golden Circle, FNP  lisinopril (PRINIVIL,ZESTRIL) 20 MG tablet Take 1 tablet (20 mg total) by mouth every evening. 03/27/15   Golden Circle, FNP  nicotine (NICODERM CQ - DOSED IN MG/24 HOURS) 14 mg/24hr patch Place 1 patch (14 mg total) onto the skin daily. 01/28/15   Barton Dubois, MD  omeprazole (PRILOSEC) 20 MG capsule Take 1 capsule (20 mg total) by mouth daily. 03/27/15   Golden Circle, FNP   BP 119/60 mmHg  Pulse 79  Temp(Src) 97.9 F (36.6 C) (Oral)  Resp 18  Ht 5\' 7"   (1.702 m)  Wt 154 lb (69.854 kg)  BMI 24.11 kg/m2  SpO2 100% Physical Exam  Constitutional: He appears well-developed and well-nourished. No distress.  HENT:  Head: Atraumatic.  Eyes: Conjunctivae are normal.  Neck: Neck supple.  Cardiovascular: Normal rate and regular rhythm.   Pulmonary/Chest: Effort normal and breath sounds normal.  Abdominal: Soft. Bowel sounds are normal. He exhibits no distension and no mass. There is no tenderness. There is no rebound and no guarding.  Genitourinary:  Chaperone present during exam. Evidence of external hemorrhoid. Exquisite tenderness to the rectum on digital rectal exam. Difficult to assess rectum due to patient's discomfort. No obvious perirectal abscess or anal fissure on visual inspection. No evidence of external thrombosed hemorrhoid.  Neurological: He is alert.  Neurologic exam:  Speech clear, pupils equal round reactive to light, extraocular movements intact  Normal peripheral visual fields Cranial nerves III through XII normal including no facial droop Follows commands, moves all extremities x4, normal strength to bilateral upper and lower extremities at all major muscle groups including grip Sensation normal to light touch  Coordination intact, no limb ataxia, finger-nose-finger normal Rapid alternating movements normal No pronator drift Gait normal   Skin: No rash noted.  Psychiatric: He has a normal mood and affect.  Nursing note and vitals reviewed.   ED Course  Procedures (including critical care time)  Patient presents with constipation due to rectal pain. He does have external hemorrhoid but examination is limited due to patient discomfort with digital rectal exam. I'm unable to rule out any obvious mass causing his rectal discomfort. He does complain of abnormal weight loss but denies any night sweats or fever. No history of cancer. He is scheduled to be seen by GI specialist next month.    Patient also complaining of  intermittent right side tingling sensation. No sensation deficits or focal neuro deficit on exam. Questionable TIA. Pt was diagnosed with infarct of R MCA involving the posterior insula and R parietal lobe. Also diagnosed with occlusion of the R internal carotid artery, and occlusion of the parietal branch of the R MCA.    Currently labs indicate renal insufficiency with BUN 36, and Cr 1.40, likely dehydration.  IVF given.  Plan to consult neurology for further recommendation.  Care discussed with DR. Plunkett.  2:11 PM Appreciate consultation from neurologist, Dr. Leonel Ramsay who agrees to see patient in the ER. He also recommend brain MRI for further evaluation.  2:45 PM Pt has been evaluated by Dr. Leonel Ramsay who recommend cancelling brain MRI as it will not change management.  Recommend pt to f/u with neurologist Dr. Leonie Man for further care.      Labs Review Labs Reviewed  COMPREHENSIVE METABOLIC PANEL - Abnormal; Notable for the following:    Glucose, Bld 109 (*)    BUN 32 (*)    Creatinine, Ser 1.44 (*)    GFR  calc non Af Amer 50 (*)    GFR calc Af Amer 58 (*)    All other components within normal limits  I-STAT CHEM 8, ED - Abnormal; Notable for the following:    BUN 36 (*)    Creatinine, Ser 1.40 (*)    Glucose, Bld 106 (*)    Calcium, Ion 1.12 (*)    All other components within normal limits  PROTIME-INR  APTT  CBC  DIFFERENTIAL  LDL CHOLESTEROL, DIRECT  I-STAT TROPOININ, ED  CBG MONITORING, ED  POC OCCULT BLOOD, ED    Imaging Review No results found. I have personally reviewed and evaluated these lab results as part of my medical decision-making.   EKG Interpretation   Date/Time:  Friday April 05 2015 12:43:09 EDT Ventricular Rate:  73 PR Interval:  138 QRS Duration: 102 QT Interval:  372 QTC Calculation: 409 R Axis:   92 Text Interpretation:  Normal sinus rhythm Rightward axis No significant  change since last tracing Confirmed by Maryan Rued  MD, Loree Fee  [phone]) on  04/05/2015 2:13:46 PM       MDM   Final diagnoses:  Pain, rectal  External hemorrhoids  Transient cerebral ischemia, unspecified transient cerebral ischemia type    BP 129/54 mmHg  Pulse 56  Temp(Src) 97.9 F (36.6 C) (Oral)  Resp 11  Ht 5\' 7"  (1.702 m)  Wt 154 lb (69.854 kg)  BMI 24.11 kg/m2  SpO2 99%  I have reviewed nursing notes and vital signs. I personally viewed the imaging tests through PACS system and agrees with radiologist's intepretation I reviewed available ER/hospitalization records through the EMR     Domenic Moras, PA-C 04/05/15 2003  Blanchie Dessert, MD 04/05/15 2047

## 2015-04-05 NOTE — Discharge Instructions (Signed)
Please follow up with your GI specialist for further evaluation of your rectal pain and constipation.  Follow up with your neurologist, Dr. Leonie Man next week for further care.  Continue to take suppository, stool softener and fiber to decrease risk of worsening constipation.  Return to ER if you have any other concerns.

## 2015-04-05 NOTE — ED Notes (Signed)
Pt able to ambulate in hall independently.  Gait steady and even.

## 2015-04-05 NOTE — Consult Note (Signed)
Referring Physician: Maryan Rued    Chief Complaint: possible stroke  HPI:                                                                                                                                         Ray Williams is an 64 y.o. male who recently was diagnosed with a right MCA infarct back in June 2016.  Diagnostics at that time revealed a occluded right carotid artery and patent left ICA. Echo showed 60-65% EF. LDL 171 and HbA1c 5.8. Patient currently is on ASA and Plavix. Over the last week he has noted he has been constipated. The constipation "has been causing him discomfort and anxiety. HE notes that the more pain he has, the more anxious he becomes and then will note tingling in the right side of his face and right arm. " HE is unsure if the intermittent tingling is secondary to his anxiety --as he notes it will dissipate when he relaxes.  Due to Dr. Leonie Man telling him to come to the hospital is he notes any stroke symptoms, he came to the E.   Date last known well: Date: 04/05/2015 Time last known well: Time: 12:00 tPA Given: No: Symptoms resolved and recent stroke in past three months.    Past Medical History  Diagnosis Date  . Hypertension   . Kidney stones   . Stroke     History reviewed. No pertinent past surgical history.  Family History  Problem Relation Age of Onset  . Heart disease Mother   . Heart attack Mother   . Healthy Father   . Healthy Maternal Grandmother   . Healthy Maternal Grandfather   . Healthy Paternal Grandmother   . Healthy Paternal Grandfather    Social History:  reports that he quit smoking about 2 months ago. He has never used smokeless tobacco. He reports that he drinks alcohol. He reports that he does not use illicit drugs.  Allergies: No Known Allergies  Medications:                                                                                                                           Current Facility-Administered Medications   Medication Dose Route Frequency Provider Last Rate Last Dose  . hydrocortisone (ANUSOL-HC) suppository 25 mg  25 mg Rectal Once Domenic Moras, PA-C      . sodium  chloride 0.9 % bolus 1,000 mL  1,000 mL Intravenous Once Domenic Moras, PA-C 1,000 mL/hr at 04/05/15 1358 1,000 mL at 04/05/15 1358   Current Outpatient Prescriptions  Medication Sig Dispense Refill  . aspirin 81 MG EC tablet Take 1 tablet (81 mg total) by mouth daily. To be taken along plavix for 3 months. After that only plavix. 30 tablet 5  . atorvastatin (LIPITOR) 40 MG tablet Take 1 tablet (40 mg total) by mouth daily at 6 PM. 30 tablet 5  . clopidogrel (PLAVIX) 75 MG tablet Take 1 tablet (75 mg total) by mouth daily. To be taken on daily basis along with aspirin for 3 months; then just plavix. 30 tablet 5  . hydrochlorothiazide (HYDRODIURIL) 25 MG tablet Take 1 tablet (25 mg total) by mouth daily. 30 tablet 5  . lisinopril (PRINIVIL,ZESTRIL) 20 MG tablet Take 1 tablet (20 mg total) by mouth every evening. 30 tablet 5  . nicotine (NICODERM CQ - DOSED IN MG/24 HOURS) 14 mg/24hr patch Place 1 patch (14 mg total) onto the skin daily. 28 patch 0  . omeprazole (PRILOSEC) 20 MG capsule Take 1 capsule (20 mg total) by mouth daily. 30 capsule 5     ROS:                                                                                                                                       History obtained from the patient  General ROS: negative for - chills, fatigue, fever, night sweats, weight gain or weight loss Psychological ROS: negative for - behavioral disorder, hallucinations, memory difficulties, mood swings or suicidal ideation Ophthalmic ROS: negative for - blurry vision, double vision, eye pain or loss of vision ENT ROS: negative for - epistaxis, nasal discharge, oral lesions, sore throat, tinnitus or vertigo Allergy and Immunology ROS: negative for - hives or itchy/watery eyes Hematological and Lymphatic ROS: negative for - bleeding  problems, bruising or swollen lymph nodes Endocrine ROS: negative for - galactorrhea, hair pattern changes, polydipsia/polyuria or temperature intolerance Respiratory ROS: negative for - cough, hemoptysis, shortness of breath or wheezing Cardiovascular ROS: negative for - chest pain, dyspnea on exertion, edema or irregular heartbeat Gastrointestinal ROS: negative for - abdominal pain, diarrhea, hematemesis, nausea/vomiting or stool incontinence Genito-Urinary ROS: negative for - dysuria, hematuria, incontinence or urinary frequency/urgency Musculoskeletal ROS: negative for - joint swelling or muscular weakness Neurological ROS: as noted in HPI Dermatological ROS: negative for rash and skin lesion changes  Neurologic Examination:  Blood pressure 119/60, pulse 79, temperature 97.9 F (36.6 C), temperature source Oral, resp. rate 18, height 5\' 7"  (1.702 m), weight 69.854 kg (154 lb), SpO2 100 %.  HEENT-  Normocephalic, no lesions, without obvious abnormality.  Normal external eye and conjunctiva.  Normal TM's bilaterally.  Normal auditory canals and external ears. Normal external nose, mucus membranes and septum.  Normal pharynx. Cardiovascular- S1, S2 normal, pulses palpable throughout   Lungs- chest clear, no wheezing, rales, normal symmetric air entry Abdomen- normal findings: bowel sounds normal Extremities- no edema Lymph-no adenopathy palpable Musculoskeletal-no joint tenderness, deformity or swelling Skin-warm and dry, no hyperpigmentation, vitiligo, or suspicious lesions  Neurological Examination Mental Status: Alert, oriented, thought content appropriate.  Speech fluent without evidence of aphasia.  Able to follow 3 step commands without difficulty. Cranial Nerves: II: Discs flat bilaterally; Visual fields grossly normal, pupils equal, round, reactive to light and  accommodation III,IV, VI: ptosis not present, extra-ocular motions intact bilaterally V,VII: smile symmetric, facial light touch sensation normal bilaterally VIII: hearing normal bilaterally IX,X: uvula rises symmetrically XI: bilateral shoulder shrug XII: midline tongue extension Motor: Right : Upper extremity   5/5    Left:     Upper extremity   5/5  Lower extremity   5/5     Lower extremity   5/5 Tone and bulk:normal tone throughout; no atrophy noted Sensory: Pinprick and light touch intact throughout, bilaterally Deep Tendon Reflexes: 2+ and symmetric throughout Plantars: Right: downgoing   Left: downgoing Cerebellar: normal finger-to-nose,  and normal heel-to-shin test Gait: normal gait and station       Lab Results: Basic Metabolic Panel:  Recent Labs Lab 04/05/15 1250 04/05/15 1259  NA 135 136  K 4.5 4.8  CL 101 102  CO2 23  --   GLUCOSE 109* 106*  BUN 32* 36*  CREATININE 1.44* 1.40*  CALCIUM 9.7  --     Liver Function Tests:  Recent Labs Lab 04/05/15 1250  AST 21  ALT 48  ALKPHOS 68  BILITOT 1.0  PROT 7.5  ALBUMIN 4.4   No results for input(s): LIPASE, AMYLASE in the last 168 hours. No results for input(s): AMMONIA in the last 168 hours.  CBC:  Recent Labs Lab 04/05/15 1250 04/05/15 1259  WBC 8.9  --   NEUTROABS 6.1  --   HGB 14.0 15.3  HCT 41.8 45.0  MCV 88.2  --   PLT 297  --     Cardiac Enzymes: No results for input(s): CKTOTAL, CKMB, CKMBINDEX, TROPONINI in the last 168 hours.  Lipid Panel: No results for input(s): CHOL, TRIG, HDL, CHOLHDL, VLDL, LDLCALC in the last 168 hours.  CBG: No results for input(s): GLUCAP in the last 168 hours.  Microbiology: No results found for this or any previous visit.  Coagulation Studies:  Recent Labs  04/05/15 1250  LABPROT 13.1  INR 0.97    Imaging: No results found.     Assessment and plan discussed with with attending physician and they are in agreement.    Ray Quill  PA-C [facility] [phone]  04/05/2015, 2:19 PM   Stroke Risk Factors - hypertension    Assessment: 64 y.o. male presents with 2 episodes of paresthesia in the setting of anxiety. His symptom of the spreading paresthesia would be unusual for TIA, but it is possible. He only recently had a complete stroke workup, and I do not think that admission for repeat testing would be of likely benefit to him. With recurrent symptoms in  the same distribution, cardiac emboli would be less likely. Symptoms are contralateral from his previous stroke and therefore I also think that seizure would be relatively unlikely. Given that both episodes were associated with significant anxiety, it is possible that this was the etiology for these symptoms.  Of note, the patient presented today for rectal pain with constipation and feels that both of these episodes happened in the setting of significant anxiety due to this.  1) could check LDL to ensure it is coming down  2) rectal pain/constipation per ED physicians  Roland Rack, MD [facility] [phone]  If 7pm- 7am, please page neurology on call as listed in [place].

## 2015-04-05 NOTE — ED Notes (Signed)
Pt reports constipation for several days. Pt reports rt arm and rt face numbness for several days as well.  Pt states that he has had a stroke in the past. Pt states that he was told to come here by neurologist. No neuro deficits in triage.

## 2015-04-06 LAB — LDL CHOLESTEROL, DIRECT: LDL DIRECT: 60 mg/dL (ref 0–99)

## 2015-04-09 ENCOUNTER — Ambulatory Visit (INDEPENDENT_AMBULATORY_CARE_PROVIDER_SITE_OTHER): Payer: Self-pay | Admitting: Neurology

## 2015-04-09 ENCOUNTER — Encounter: Payer: Self-pay | Admitting: Neurology

## 2015-04-09 VITALS — BP 106/71 | HR 68 | Ht 67.0 in | Wt 153.2 lb

## 2015-04-09 DIAGNOSIS — I639 Cerebral infarction, unspecified: Secondary | ICD-10-CM

## 2015-04-09 DIAGNOSIS — I63239 Cerebral infarction due to unspecified occlusion or stenosis of unspecified carotid arteries: Secondary | ICD-10-CM

## 2015-04-09 NOTE — Progress Notes (Signed)
[facility] [address] [place]. [place] [address] [phone]       OFFICE FOLLOW-UP NOTE  [name] Date of Birth:  02/09/1951 Medical Record Number:  [id number]   HPI: Mr Ray Williams is a 64 year Caucasian male who seen today for first office follow-up visit following admission to [facility] on 01/26/15 for stroke. Ray Williams is an 64 y.o. male with a history of hypertension transferred to [facility] from [facility] for evaluation of speech difficulties, headache (resolved), AMS, and balance issues. Most of the history was obtained from the patient's family including his wife, daughter, and son-in-law. Apparently the patient had been having difficulties with severe hypertension recently. He was seen at the urgent care center and by his physician and prescribed Lotensin as well as hydrochlorothiazide.  He had to have a dental extraction earlier the week prior to admission however the procedure was postponed secondary to elevated blood pressures. He returned on Thursday of that week and had 3 teeth pulled and was placed on amoxicillin, ibuprofen, and hydrocodone. On Friday he did not feel well; however, the family attributed this to having had his teeth pulled. When he went to bed  night  Prior the essentially seemed normal. The morning of admission he awoke at 7 AM with a severe headache. It was located over his right eye. He described it as dull and a 7 on a 1-10 scale. It lasted approximately one hour. He took several ibuprofen without relief. He denies taking any hydrocodone today. The patient's daughter reports that he had cluster headaches 3-4 years ago although these resolved. Today at approximately 10:30 the patient started to act strange, lethargic ,and was felt to be confused. He also had some balance problems and fell into a cabinet and bruised his forehead. He was brought to the emergency department for further evaluation. A CT scan was  performed that was consistent with an acute infarct. An MRI/MRA is pending. The emergency room department nurse examined him prior to his MRI and gave him an NIH score of 1.Date last known well: Date: 01/25/2015 Time last known well: Unable to determine tPA Given: No: Late presentation. Minimal deficits On exam he was found to have partial left homonymous hemianopsia and mild diminished fine motor skills in the left hand and mild dragging of the left leg only. MRI scan of the brain showed an acute infarct in the right middle cerebral artery territory involving posterior left insular and right parietal cortex. MRA showed occlusion of the proximal right internal carotid artery which was confirmed subsequently on CT angiogram. Transthoracic echo showed normal ejection fraction without cardiac source of embolism. Carotid ultrasound confirmed proximal right ICA occlusion. LDL cholesterol was elevated at 171 and hemoglobin A1c was normal at 5.8. Patient was started on aspirin and Plavix and did well. He states his speech has recovered completely. He is has noted improvement in his peripheral vision as well. He has no subjective weakness. He does complain of decreased stamina and energy and in fact his lost about 20 pounds and is concerned. His primary care physician is made and appointment for him to see a gastroenterologist on September 12. He also had rectal pain as well as bleeding. 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  ROS:   14 system review of systems is positive for  fatigue, appetite and activity change, weight loss, cold intolerance, constipation, rectal pain,  insomnia, frequent waking, snoring, easy bruising, memory loss, numbness, agitation, confusion, depression, nervousness and anxiety. PMH:  Past Medical History  Diagnosis Date  . Hypertension   . Kidney stones   . Stroke     Social History:  Social History    Social History  . Marital Status: Married    Spouse Name: N/A  . Number of Children: 2  . Years of Education: 12   Occupational History  . Grading Contractor    Social History Main Topics  . Smoking status: Former Smoker    Quit date: 01/26/2015  . Smokeless tobacco: Never Used  . Alcohol Use: 0.6 oz/week    0 Standard drinks or equivalent, 1 Cans of beer per week     Comment: occasionally  . Drug Use: No  . Sexual Activity: Not on file   Other Topics Concern  . Not on file   Social History Narrative   Fun: Fish, collectable cars/classic cars   Denies religious beliefs effecting health care.     Medications:   Current Outpatient Prescriptions on File Prior to Visit  Medication Sig Dispense Refill  . aspirin 81 MG EC tablet Take 1 tablet (81 mg total) by mouth daily. To be taken along plavix for 3 months. After that only plavix. 30 tablet 5  . atorvastatin (LIPITOR) 40 MG tablet Take 1 tablet (40 mg total) by mouth daily at 6 PM. 30 tablet 5  . clopidogrel (PLAVIX) 75 MG tablet Take 1 tablet (75 mg total) by mouth daily. To be taken on daily basis along with aspirin for 3 months; then just plavix. 30 tablet 5  . hydrochlorothiazide (HYDRODIURIL) 25 MG tablet Take 1 tablet (25 mg total) by mouth daily. 30 tablet 5  . lisinopril (PRINIVIL,ZESTRIL) 20 MG tablet Take 1 tablet (20 mg total) by mouth every evening. 30 tablet 5  . nicotine (NICODERM CQ - DOSED IN MG/24 HOURS) 14 mg/24hr patch Place 1 patch (14 mg total) onto the skin daily. 28 patch 0  . omeprazole (PRILOSEC) 20 MG capsule Take 1 capsule (20 mg total) by mouth daily. 30 capsule 5   No current facility-administered medications on file prior to visit.    Allergies:  No Known Allergies  Physical Exam General: well developed, well nourished middle aged Caucasian male, seated, in no evident distress Head: head normocephalic and atraumatic.  Neck: supple with no carotid or supraclavicular bruits Cardiovascular:  regular rate and rhythm, no murmurs Musculoskeletal: no deformity Skin:  no rash/petichiae Vascular:  Normal pulses all extremities Filed Vitals:   04/09/15 0824  BP: 106/71  Pulse: 68   Neurologic Exam Mental Status: Awake and fully alert. Oriented to place and time. Recent and remote memory intact. Attention span, concentration and fund of knowledge appropriate. Mood and affect appropriate.  Cranial Nerves: Fundoscopic exam reveals sharp disc margins. Pupils equal, briskly reactive to light. Extraocular movements full without nystagmus. Visual fields show partial left homonymous hemianopsia to confrontation. Hearing intact. Facial sensation intact. Face, tongue, palate moves normally and symmetrically.  Motor: Normal bulk and tone. Normal strength in all tested extremity muscles. Sensory.: intact to touch ,pinprick .position and vibratory sensation.  Coordination: Rapid alternating movements normal in all extremities. Finger-to-nose and heel-to-shin performed accurately bilaterally. Gait and Station: Arises from chair without difficulty. Stance is normal. Gait demonstrates normal stride length and balance . Able to heel, toe and tandem walk without difficulty.  Reflexes: 1+ and symmetric. Toes downgoing.   NIHSS 1 Modified Rankin  2  ASSESSMENT: 64 year Caucasian male with right MCA branch infarct in June 2016 secondary to thromboembolism from proximal right ICA occlusion was doing quite well with very mild left-sided vision deficits. Vascular risk factors of hypertension, hyperlipidemia and large vessel atherosclerosis    PLAN: I had a long d/w patient, wife and daughter about his recent stroke, risk for recurrent stroke/TIAs, personally independently reviewed imaging studies and stroke evaluation results and answered questions.Continue Plavix 75 mg daily alone but discontinue aspirin as it has been more than 2 months since her stroke and is having some minor hemorrhoid bleeding  for  secondary stroke prevention and maintain strict control of hypertension with blood pressure goal below 130/90, diabetes with hemoglobin A1c goal below 6.5% and lipids with LDL cholesterol goal below 70 mg/dL. I also advised the patient to eat a healthy diet with plenty of whole grains, cereals, fruits and vegetables, exercise regularly and maintain ideal body weight. I advised him to see her ophthalmologist to have computerized visual field testing done to objectively define his visual field deficit prior to thinking about driving heavy equipment.. He has an upcoming appointment with the gastroenterologist to discuss weight loss and possible colonoscopy. He may hold Plavix for 5 days prior to colonoscopy with a small but acceptable preprocedural risk of TIA/stroke if agreeable with him.. I also advised him to increase participation in activities for stress laxation and he seemed to be having significant anxiety/stress symptoms. I have also counseled him about quitting smoking and alcohol. Greater than 50% of time during this 25 minute visit was spent on counseling, discussion with patient and family and coordination of care  Followup in the future with me in 6 months or call earlier if necessary. Antony Contras, MD Note: This document was prepared with digital dictation and possible smart phrase technology. Any transcriptional errors that result from this process are unintentional

## 2015-04-09 NOTE — Patient Instructions (Signed)
I had a long d/w patient, wife and daughter about his recent stroke, risk for recurrent stroke/TIAs, personally independently reviewed imaging studies and stroke evaluation results and answered questions.Continue Plavix 75 mg daily alone but discontinue aspirin as it has been more than 2 months since her stroke and is having some minor hemorrhoid bleeding  for secondary stroke prevention and maintain strict control of hypertension with blood pressure goal below 130/90, diabetes with hemoglobin A1c goal below 6.5% and lipids with LDL cholesterol goal below 70 mg/dL. I also advised the patient to eat a healthy diet with plenty of whole grains, cereals, fruits and vegetables, exercise regularly and maintain ideal body weight he has an upcoming appointment with the gastroenterologist to discuss weight loss and possible colonoscopy. He may hold Plavix for 5 days prior to colonoscopy with a small but acceptable preprocedural risk of TIA/stroke if agreeable with him. Followup in the future with me in 6 months or call earlier if necessary.  Stroke Prevention Some medical conditions and behaviors are associated with an increased chance of having a stroke. You may prevent a stroke by making healthy choices and managing medical conditions. HOW CAN I REDUCE MY RISK OF HAVING A STROKE?   Stay physically active. Get at least 30 minutes of activity on most or all days.  Do not smoke. It may also be helpful to avoid exposure to secondhand smoke.  Limit alcohol use. Moderate alcohol use is considered to be:  No more than 2 drinks per day for men.  No more than 1 drink per day for nonpregnant women.  Eat healthy foods. This involves:  Eating 5 or more servings of fruits and vegetables a day.  Making dietary changes that address high blood pressure (hypertension), high cholesterol, diabetes, or obesity.  Manage your cholesterol levels.  Making food choices that are high in fiber and low in saturated fat, trans  fat, and cholesterol may control cholesterol levels.  Take any prescribed medicines to control cholesterol as directed by your health care provider.  Manage your diabetes.  Controlling your carbohydrate and sugar intake is recommended to manage diabetes.  Take any prescribed medicines to control diabetes as directed by your health care provider.  Control your hypertension.  Making food choices that are low in salt (sodium), saturated fat, trans fat, and cholesterol is recommended to manage hypertension.  Take any prescribed medicines to control hypertension as directed by your health care provider.  Maintain a healthy weight.  Reducing calorie intake and making food choices that are low in sodium, saturated fat, trans fat, and cholesterol are recommended to manage weight.  Stop drug abuse.  Avoid taking birth control pills.  Talk to your health care provider about the risks of taking birth control pills if you are over 13 years old, smoke, get migraines, or have ever had a blood clot.  Get evaluated for sleep disorders (sleep apnea).  Talk to your health care provider about getting a sleep evaluation if you snore a lot or have excessive sleepiness.  Take medicines only as directed by your health care provider.  For some people, aspirin or blood thinners (anticoagulants) are helpful in reducing the risk of forming abnormal blood clots that can lead to stroke. If you have the irregular heart rhythm of atrial fibrillation, you should be on a blood thinner unless there is a good reason you cannot take them.  Understand all your medicine instructions.  Make sure that other conditions (such as anemia or atherosclerosis) are addressed.  SEEK IMMEDIATE MEDICAL CARE IF:   You have sudden weakness or numbness of the face, arm, or leg, especially on one side of the body.  Your face or eyelid droops to one side.  You have sudden confusion.  You have trouble speaking (aphasia) or  understanding.  You have sudden trouble seeing in one or both eyes.  You have sudden trouble walking.  You have dizziness.  You have a loss of balance or coordination.  You have a sudden, severe headache with no known cause.  You have new chest pain or an irregular heartbeat. Any of these symptoms may represent a serious problem that is an emergency. Do not wait to see if the symptoms will go away. Get medical help at once. Call your local emergency services (911 in [place] Do not drive yourself to the hospital. Document Released: 09/03/2004 Document Revised: 12/11/2013 Document Reviewed: 01/27/2013 [facility] Patient Information 2015 [place], [place]. This information is not intended to replace advice given to you by your health care provider. Make sure you discuss any questions you have with your health care provider.

## 2015-04-22 ENCOUNTER — Telehealth: Payer: Self-pay | Admitting: Gastroenterology

## 2015-04-22 ENCOUNTER — Ambulatory Visit (INDEPENDENT_AMBULATORY_CARE_PROVIDER_SITE_OTHER): Payer: Self-pay | Admitting: Physician Assistant

## 2015-04-22 ENCOUNTER — Encounter: Payer: Self-pay | Admitting: Physician Assistant

## 2015-04-22 ENCOUNTER — Other Ambulatory Visit (INDEPENDENT_AMBULATORY_CARE_PROVIDER_SITE_OTHER): Payer: Self-pay

## 2015-04-22 VITALS — BP 98/48 | HR 90 | Ht 67.0 in | Wt 152.0 lb

## 2015-04-22 DIAGNOSIS — R634 Abnormal weight loss: Secondary | ICD-10-CM

## 2015-04-22 DIAGNOSIS — K602 Anal fissure, unspecified: Secondary | ICD-10-CM

## 2015-04-22 DIAGNOSIS — Z8673 Personal history of transient ischemic attack (TIA), and cerebral infarction without residual deficits: Secondary | ICD-10-CM

## 2015-04-22 DIAGNOSIS — K625 Hemorrhage of anus and rectum: Secondary | ICD-10-CM

## 2015-04-22 DIAGNOSIS — K6289 Other specified diseases of anus and rectum: Secondary | ICD-10-CM

## 2015-04-22 DIAGNOSIS — Z7901 Long term (current) use of anticoagulants: Secondary | ICD-10-CM

## 2015-04-22 LAB — CBC WITH DIFFERENTIAL/PLATELET
BASOS ABS: 0 10*3/uL (ref 0.0–0.1)
Basophils Relative: 0.4 % (ref 0.0–3.0)
EOS ABS: 0.2 10*3/uL (ref 0.0–0.7)
Eosinophils Relative: 2.1 % (ref 0.0–5.0)
HEMATOCRIT: 41.2 % (ref 39.0–52.0)
HEMOGLOBIN: 14.1 g/dL (ref 13.0–17.0)
LYMPHS PCT: 22.5 % (ref 12.0–46.0)
Lymphs Abs: 2 10*3/uL (ref 0.7–4.0)
MCHC: 34.3 g/dL (ref 30.0–36.0)
MCV: 89 fl (ref 78.0–100.0)
MONOS PCT: 5.4 % (ref 3.0–12.0)
Monocytes Absolute: 0.5 10*3/uL (ref 0.1–1.0)
NEUTROS ABS: 6.3 10*3/uL (ref 1.4–7.7)
Neutrophils Relative %: 69.6 % (ref 43.0–77.0)
PLATELETS: 307 10*3/uL (ref 150.0–400.0)
RBC: 4.63 Mil/uL (ref 4.22–5.81)
RDW: 14.3 % (ref 11.5–15.5)
WBC: 9 10*3/uL (ref 4.0–10.5)

## 2015-04-22 MED ORDER — DILTIAZEM GEL 2 %: CUTANEOUS | Status: DC

## 2015-04-22 MED ORDER — PEG-KCL-NACL-NASULF-NA ASC-C 100 G PO SOLR: ORAL | Status: DC

## 2015-04-22 MED ORDER — LIDOCAINE (ANORECTAL) 5 % EX CREA: TOPICAL_CREAM | CUTANEOUS | Status: DC

## 2015-04-22 NOTE — Progress Notes (Signed)
Patient ID: Ray Williams, male   DOB: 1951/06/03, 64 y.o.   MRN: [id number]    HPI:  Terre Zabriskie is a 64 y.o.   male  referred by Golden Circle, FNP  For evaluation of rectal bleeding, and weight loss. Adin was admitted to [facility] in mid June for a stroke. He has a history of hypertension. He had presented to [facility] for evaluation of speech difficulties, altered mental status, and headache. He felt disease the morning of admission and fell he was brought to the emergency room and CT scan was consistent with an acute infarct he was found to have partial left homonymous hemianopsia and diminished fine motor skills in the left hand and dragging of the left leg. MRI of the brain showed an acute infarct in the right middle cerebral artery territory involving posterior left insular and right parietal cortex MRA showed occlusion of the proximal right internal carotid artery which was confirmed subsequently on CT angiogram. Transthoracic echo showed normal ejection fraction without cardiac source of embolism carotid ultrasound confirmed proximal left ICA occlusion LDL  Was elevated at 171. Patient was started on aspirin and Plavix. Upon discharge from the hospital he decided he would try to take charge of his health. He eliminated sugary treats and soft drinks from his diet. He has tried to limit may excess carbohydrates from his diet as well. He has lost 22 pounds in 2-1/2 months. Through this time with the dietary changes, he has felt constipated. He is having very firm stools that he has to strain to start. He has had several episodes of blood on the toilet tissue. Over the past several weeks he states it is very painful to pass his stool and he finds himself trying to hold onto his bowel movements so that he doesn't have to go to the pain of passing them. More recently, he has had episodes of acute rectal pain followed by an urgent bowel movement. He states the pain sometimes causes  him to scream. He has no abdominal pain. He has had no fever, chills, or night sweats. He has tried using a stool softener 1 capsule at bedtime. He has been evaluated at urgent care in [place] and was given a cream for hemorrhoids but he does not feel it is helping. He has never had a colonoscopy. He denies a family history of colon cancer, colon polyps, or inflammatory bowel disease.   Past Medical History  Diagnosis Date  . Hypertension   . Kidney stones   . Stroke     History reviewed. No pertinent past surgical history. Family History  Problem Relation Age of Onset  . Heart disease Mother   . Heart attack Mother   . Healthy Father   . Healthy Maternal Grandmother   . Healthy Maternal Grandfather   . Healthy Paternal Grandmother   . Healthy Paternal Grandfather    Social History  Substance Use Topics  . Smoking status: Former Smoker    Quit date: 01/26/2015  . Smokeless tobacco: Never Used  . Alcohol Use: 0.6 oz/week    0 Standard drinks or equivalent, 1 Cans of beer per week     Comment: occasionally   Current Outpatient Prescriptions  Medication Sig Dispense Refill  . atorvastatin (LIPITOR) 40 MG tablet Take 1 tablet (40 mg total) by mouth daily at 6 PM. 30 tablet 5  . clopidogrel (PLAVIX) 75 MG tablet Take 1 tablet (75 mg total) by mouth daily. To be taken on  daily basis along with aspirin for 3 months; then just plavix. 30 tablet 5  . hydrochlorothiazide (HYDRODIURIL) 25 MG tablet Take 1 tablet (25 mg total) by mouth daily. 30 tablet 5  . lidocaine (XYLOCAINE) 5 % ointment APPLY TO PERIANAL REGION UP TO 6 TIMES A DAY  0  . lisinopril (PRINIVIL,ZESTRIL) 20 MG tablet Take 1 tablet (20 mg total) by mouth every evening. 30 tablet 5  . LORazepam (ATIVAN) 0.5 MG tablet Take 0.5 mg by mouth every 8 (eight) hours as needed.  0  . diltiazem 2 % GEL Apply rectally threes time a day for 8 weeks. 30 g 0  . Lidocaine, Anorectal, (RECTICARE) 5 % CREA Apply rectally 4-5 times daily  as needed. 30 g 0  . peg 3350 powder (MOVIPREP) 100 G SOLR Use as directed per Colonoscopy/ 1 kit 0   No current facility-administered medications for this visit.   No Known Allergies   Review of Systems: Gen: Denies any fever, chills, sweats, anorexia, fatigue, weakness, malaise, weight loss, and sleep disorder CV: Denies chest pain, angina, palpitations, syncope, orthopnea, PND, peripheral edema, and claudication. Resp: Denies dyspnea at rest, dyspnea with exercise, cough, sputum, wheezing, coughing up blood, and pleurisy. GI: Denies vomiting blood, jaundice, and fecal incontinence.   Denies dysphagia or odynophagia. GU : Denies urinary burning, blood in urine, urinary frequency, urinary hesitancy, nocturnal urination, and urinary incontinence. MS: Denies joint pain, limitation of movement, and swelling, stiffness, low back pain, extremity pain. Denies muscle weakness, cramps, atrophy.  Derm: Denies rash, itching, dry skin, hives, moles, warts, or unhealing ulcers.  Psych: Denies depression, anxiety, memory loss, suicidal ideation, hallucinations, paranoia, and confusion. Heme: Denies bruising, bleeding, and enlarged lymph nodes. Neuro:   Status post stroke June 18 Endo:  Denies any problems with DM, thyroid, adrenal function  Physical Exam: BP 98/48 mmHg  Pulse 90  Ht $R'5\' 7"'IL$  (1.702 m)  Wt 152 lb (68.947 kg)  BMI 23.80 kg/m2 Constitutional: Pleasant,well-developed, male in no acute distress. HEENT: Normocephalic and atraumatic. Conjunctivae are normal. No scleral icterus. Neck supple.  No JVD Cardiovascular: Normal rate, regular rhythm.  Pulmonary/chest: Effort normal and breath sounds normal. No wheezing, rales or rhonchi. Abdominal: Soft, nondistended, nontender. Bowel sounds active throughout. There are no masses palpable. No hepatomegaly. Rectal:  No external hemorrhoids noted. Digital rectal exam very painful for patient,  Suggestive of anal fissure. Small ridge palpable  posteriorly. Extremities: no edema Lymphadenopathy: No cervical adenopathy noted. Neurological: Alert and oriented to person place and time. Skin: Skin is warm and dry. No rashes noted. Psychiatric: Normal mood and affect. Behavior is normal.  ASSESSMENT AND PLAN:  64 year old male status post stroke in June currently maintained on Plavix referred for rectal bleeding and weight loss. Bleeding likely due to his fissure. Patient has been instructed to use Colace 2 capsules at bedtime. He will use Tucks wipes. He will be given a trial of diltiazem ointment 2% to apply rectally 3 times daily for 8 weeks. He will also be given a prescription for recticare  Cream 5% to use as needed for rectal pain. He will be scheduled for colonoscopy to evaluate for polyps, neoplasia, internal hemorrhoids, etc.The risks, benefits, and alternatives to colonoscopy with possible biopsy and possible polypectomy were discussed with the patient and they consent to proceed.  Patient had discussed possible colonoscopy with Dr. Leonie Man on August 30 who has documented in his note that patient may hold Plavix for 5 days prior to colonoscopy with a small  but acceptable preprocedure will risk of TIA/stroke if agreeable with him. Patient wishes to proceed with colonoscopy.    Kolyn Rozario, Vita Barley PA-C 04/22/2015, 1:01 PM  CC: Golden Circle, FNP

## 2015-04-22 NOTE — Progress Notes (Signed)
i agree with the above note, plan 

## 2015-04-22 NOTE — Telephone Encounter (Signed)
PAtient called this evening due to ongoing rectal discomfort. Note reviewed he was seen for an anal fissure. He used Lidocaine cream and Diltiazem cream today but states the pain has persisted. I counseled him, based on notes available, anal fissure can take some time to heal and won't go away immediately, but the ointments should help. He has a colonoscopy scheduled on October 4th. If his symptoms are unbearable we can try low dose narcotic but this could worsen constipation and underlying fissure. We will see if we can move up his colonoscopy date to provide better visualization and ensure no other process going on, but rectal exam per report did not show thrombosed hemorrhoid or abscess or other pathology. He agreed, will continue creams and will send message for primary GI provider to call tomorrow for reassessment.

## 2015-04-22 NOTE — Patient Instructions (Signed)
You have been scheduled for a colonoscopy. Please follow written instructions given to you at your visit today.  Please pick up your prep supplies at the pharmacy within the next 1-3 days. If you use inhalers (even only as needed), please bring them with you on the day of your procedure. Your physician has requested that you go to www.startemmi.com and enter the access code given to you at your visit today. This web site gives a general overview about your procedure. However, you should still follow specific instructions given to you by our office regarding your preparation for the procedure.  We have sent the following medications to your pharmacy for you to pick up at your convenience: [facility] has requested that you go to the basement for lab work before leaving today.

## 2015-04-23 ENCOUNTER — Telehealth: Payer: Self-pay | Admitting: *Deleted

## 2015-04-23 ENCOUNTER — Encounter: Payer: Self-pay | Admitting: *Deleted

## 2015-04-23 NOTE — Telephone Encounter (Signed)
Spoke with patient and he is still having rectal pain. Rescheduled procedure to 05/03/15 at 7:30 AM with Dr. Ardis Hughs. New instructions mailed to patient.

## 2015-04-23 NOTE — Telephone Encounter (Signed)
I will ask Rollene Fare to call pt and see how he is, as well as to see if anyone has an earlier opening for a colonoscopy with time allowing for his 5 days off plavix.  Cecille Rubin

## 2015-04-23 NOTE — Telephone Encounter (Signed)
-----   Message from Vita Barley Hvozdovic, PA-C sent at 04/23/2015  8:27 AM EDT ----- See office note from yesterday and Dr Ozella Rocks note. Can you call pt to see how he is? If he is not satisfied with colon date with Dr Ardis Hughs, can you see if anyone has an earlier date--keeping in mind the 5 days he needs off plavix?

## 2015-04-23 NOTE — Telephone Encounter (Signed)
Left a message for patient to call back. 

## 2015-04-26 ENCOUNTER — Telehealth: Payer: Self-pay | Admitting: Physician Assistant

## 2015-04-26 ENCOUNTER — Other Ambulatory Visit: Payer: Self-pay | Admitting: Physician Assistant

## 2015-04-26 MED ORDER — HYDROCODONE-ACETAMINOPHEN 7.5-300 MG PO TABS: 1.0000 | ORAL_TABLET | Freq: Four times a day (QID) | ORAL | Status: DC | PRN

## 2015-04-26 NOTE — Telephone Encounter (Signed)
Called pt. patient was seen earlier this week for fissure. He has been using diltiazem ointment and rectum care cream. He has called almost daily with complaints of pain and says he can't take it anymore. He is using a stool softener along with diltiazem and rectum care. A prescription for Vicodin 7.5/500 one by mouth every 6 hours #15 has been left at the front desk for the patient he was advised that if this does not help him over the weekend he should go to the emergency room or call the service. Patient acknowledges understanding.

## 2015-04-26 NOTE — Telephone Encounter (Signed)
Patient states he has not left his bedroom. States he does not have control of bowels and "butt is raw". States he is using his cream. States he cannot go through any more days like this. Patient is asking for Cecille Rubin Hvozdovic, PA-C to call him.

## 2015-04-26 NOTE — Telephone Encounter (Signed)
Left a message for patient to call back. 

## 2015-04-29 DIAGNOSIS — Z0271 Encounter for disability determination: Secondary | ICD-10-CM

## 2015-05-03 ENCOUNTER — Encounter: Payer: Self-pay | Admitting: Gastroenterology

## 2015-05-14 ENCOUNTER — Encounter: Payer: Self-pay | Admitting: Gastroenterology

## 2015-06-20 ENCOUNTER — Telehealth: Payer: Self-pay | Admitting: Physician Assistant

## 2015-06-20 NOTE — Telephone Encounter (Signed)
Left a message for patient to call back. 

## 2015-06-20 NOTE — Telephone Encounter (Signed)
He no-showed for colonoscopy in late September as initial workup for anal pain, bleeding, weight loss.  Not sure what happened.   Is he planning to reschedule it?

## 2015-06-20 NOTE — Telephone Encounter (Signed)
Spoke with patient's wife and she states patient was afraid to have the procedure and would not come for it. She states he continues to lose weight and she is concerned. She is unable to get him to agree to come here. She will try his PCP and see if they can get patient to have the colonoscopy.

## 2015-06-21 ENCOUNTER — Telehealth: Payer: Self-pay | Admitting: Physician Assistant

## 2015-06-21 ENCOUNTER — Ambulatory Visit (INDEPENDENT_AMBULATORY_CARE_PROVIDER_SITE_OTHER): Payer: Self-pay | Admitting: Family

## 2015-06-21 ENCOUNTER — Other Ambulatory Visit: Payer: Self-pay | Admitting: Physician Assistant

## 2015-06-21 ENCOUNTER — Encounter: Payer: Self-pay | Admitting: Family

## 2015-06-21 ENCOUNTER — Ambulatory Visit (INDEPENDENT_AMBULATORY_CARE_PROVIDER_SITE_OTHER)
Admission: RE | Admit: 2015-06-21 | Discharge: 2015-06-21 | Disposition: A | Discharge status: Home / Self Care | Payer: Self-pay | Source: Ambulatory Visit | Attending: Family | Admitting: Family

## 2015-06-21 ENCOUNTER — Other Ambulatory Visit (INDEPENDENT_AMBULATORY_CARE_PROVIDER_SITE_OTHER): Payer: Self-pay

## 2015-06-21 VITALS — BP 118/82 | HR 90 | Temp 97.8°F | Resp 18 | Ht 67.0 in | Wt 143.8 lb

## 2015-06-21 DIAGNOSIS — R634 Abnormal weight loss: Secondary | ICD-10-CM

## 2015-06-21 DIAGNOSIS — K5900 Constipation, unspecified: Secondary | ICD-10-CM | POA: Insufficient documentation

## 2015-06-21 LAB — COMPREHENSIVE METABOLIC PANEL
ALBUMIN: 4.7 g/dL (ref 3.5–5.2)
ALT: 39 U/L (ref 0–53)
AST: 14 U/L (ref 0–37)
Alkaline Phosphatase: 60 U/L (ref 39–117)
BILIRUBIN TOTAL: 0.7 mg/dL (ref 0.2–1.2)
BUN: 21 mg/dL (ref 6–23)
CALCIUM: 10.3 mg/dL (ref 8.4–10.5)
CO2: 29 meq/L (ref 19–32)
Chloride: 97 mEq/L (ref 96–112)
Creatinine, Ser: 1.35 mg/dL (ref 0.40–1.50)
GFR: 56.69 mL/min — ABNORMAL LOW (ref >60.00)
GLUCOSE: 106 mg/dL — AB (ref 70–99)
Potassium: 4.5 mEq/L (ref 3.5–5.1)
Sodium: 135 mEq/L (ref 135–145)
TOTAL PROTEIN: 7.6 g/dL (ref 6.0–8.3)

## 2015-06-21 LAB — TSH: TSH: 0.91 u[IU]/mL (ref 0.35–4.50)

## 2015-06-21 LAB — PSA: PSA: 0.41 ng/mL (ref 0.10–4.00)

## 2015-06-21 NOTE — Telephone Encounter (Signed)
LM for the patient's mother Boneta Lucks.  Advised I cannot get in touch with the Provider to get approval for this prescription refill for Lidocaine.  He did not show for his colonoscopy , per Dr. Ardis Hughs in late September.  The patient did see his PCP today 11-11 regarding constipation , weight loss. Advised her to call us Monday 11-14 to see if he can get an appointment. He can get Lidocaine 5 % over the counter.

## 2015-06-21 NOTE — Progress Notes (Signed)
Subjective:    Patient ID: Ray Williams, male    DOB: Jan 06, 1951, 64 y.o.   MRN: [id number]  Chief Complaint  Patient presents with  . Weight Loss    wife states that he is losing a pound a day, has a good appetite, has issues with constapation,     HPI:  Ray Williams is a 64 y.o. male who  has a past medical history of Hypertension; Kidney stones; and Stroke ([place]). and presents today for an acute office visit. His wife is present for today's visit.   Continues to experience associated symptom of weight loss which started approximately 4 months ago losing approximately 22 pounds. He notes significant weight loss continued from previous office visit when he was 153 and now down to 143. This is an approximate 5% weight loss over the last 2 months. He does express continued constipation that has been also going on for a couple of months and has been evaluated by gastroenterology. He was noted to have a potential annal fissure and was treated with diltizem cream. Pain worsened after treatment, however has improved with time. He is currently taking stool softeners. He was scheduled for a colonoscopy however did not show for the procedure for concerns regarding the procedure and relation to holding his Plavix and increased risk of stroke while off the Plavix. He is concerned today primarily about the constipation and his wife is concerned about the weight loss. Reports that he is eating a substantial amount of food as confirmed by his wife. Denies any fevers, chills, or abdominal pains at present.    Wt Readings from Last 3 Encounters:  06/21/15 143 lb 12.8 oz (65.227 kg)  04/22/15 152 lb (68.947 kg)  04/09/15 153 lb 3.2 oz (69.491 kg)   No Known Allergies   Current Outpatient Prescriptions on File Prior to Visit  Medication Sig Dispense Refill  . atorvastatin (LIPITOR) 40 MG tablet Take 1 tablet (40 mg total) by mouth daily at 6 PM. 30 tablet 5  . clopidogrel (PLAVIX) 75 MG tablet Take 1  tablet (75 mg total) by mouth daily. To be taken on daily basis along with aspirin for 3 months; then just plavix. 30 tablet 5  . diltiazem 2 % GEL Apply rectally threes time a day for 8 weeks. 30 g 0  . hydrochlorothiazide (HYDRODIURIL) 25 MG tablet Take 1 tablet (25 mg total) by mouth daily. 30 tablet 5  . Hydrocodone-Acetaminophen 7.5-300 MG TABS Take 1 tablet by mouth every 6 (six) hours as needed. 15 each 0  . lidocaine (XYLOCAINE) 5 % ointment APPLY TO PERIANAL REGION UP TO 6 TIMES A DAY  0  . Lidocaine, Anorectal, (RECTICARE) 5 % CREA Apply rectally 4-5 times daily as needed. 30 g 0  . lisinopril (PRINIVIL,ZESTRIL) 20 MG tablet Take 1 tablet (20 mg total) by mouth every evening. 30 tablet 5  . LORazepam (ATIVAN) 0.5 MG tablet Take 0.5 mg by mouth every 8 (eight) hours as needed.  0  . peg 3350 powder (MOVIPREP) 100 G SOLR Use as directed per Colonoscopy/ 1 kit 0   No current facility-administered medications on file prior to visit.    Past Medical History  Diagnosis Date  . Hypertension   . Kidney stones   . Stroke [facility])     Review of Systems  Constitutional: Positive for unexpected weight change. Negative for fever, activity change, appetite change and fatigue.  Respiratory: Negative for cough, chest tightness and shortness of breath.  Cardiovascular: Negative for chest pain, palpitations and leg swelling.  Gastrointestinal: Positive for constipation. Negative for nausea, vomiting, abdominal pain, diarrhea, blood in stool, abdominal distention, anal bleeding and rectal pain.  Genitourinary: Negative for flank pain.      Objective:    BP 118/82 mmHg  Pulse 90  Temp(Src) 97.8 F (36.6 C) (Oral)  Resp 18  Ht $R'5\' 7"'Ra$  (1.702 m)  Wt 143 lb 12.8 oz (65.227 kg)  BMI 22.52 kg/m2  SpO2 95% Nursing note and vital signs reviewed.  Physical Exam  Constitutional: He is oriented to person, place, and time. He appears well-developed and well-nourished. No distress.  Cardiovascular:  Normal rate, regular rhythm, normal heart sounds and intact distal pulses.   Pulmonary/Chest: Effort normal and breath sounds normal. He has no wheezes. He has no rales.  Abdominal: Soft. Bowel sounds are normal. He exhibits no distension and no mass. There is no tenderness. There is no rebound and no guarding.  Neurological: He is alert and oriented to person, place, and time.  Skin: Skin is warm and dry.  Psychiatric: He has a normal mood and affect. His behavior is normal. Judgment and thought content normal.       Assessment & Plan:   Problem List Items Addressed This Visit      Digestive   Constipation    Continues to experience constipation. Continue drinking plenty of fluids and stool softeners. Recommend increasing fiber in diet. OTC medications as needed for symptom relief and possible consider Amitiza or Linzess. Follow up with GI.         Other   Loss of weight - Primary    Significant loss of about 30 pounds in 4 months equating to approximate 17% weight loss with mild changes in nutrition. BMI is 22 which is within the normal ranges. He is reportedly eating a regular diet but does have constipation. Discussed potential reasons for the weight loss.and this may be benign although underlying condition may exist. Obtain TSH, PSA, HIV, Hep C, CMET and chest x-ray. Continue current nutrition plan. Follow up with gastroenterology for the constipation and for colonoscopy. Discussed risks of stopping Plavix. Follow up pending imaging and blood work. Expect to monitor for the next 6 months as there is no specific identifiable cause at this time.       Relevant Orders   Comprehensive metabolic panel (Completed)   TSH (Completed)   HIV antibody   Hepatitis C antibody   DG Chest 2 View (Completed)   PSA (Completed)

## 2015-06-21 NOTE — Patient Instructions (Signed)
Thank you for choosing [facility].  Summary/Instructions:  Please increase daily fiber intake.  Continue to take your medications as prescribed.   Follow up with Gastroenterology for constipation and potential colonoscopy.  Please stop by the lab on the basement level of the building for your blood work. Your results will be released to [place] (or called to you) after review, usually within 72 hours after test completion. If any changes need to be made, you will be notified at that same time.  Please stop by radiology on the basement level of the building for your x-rays. Your results will be released to [place] (or called to you) after review, usually within 72 hours after test completion. If any treatments or changes are necessary, you will be notified at that same time.  If your symptoms worsen or fail to improve, please contact our office for further instruction, or in case of emergency go directly to the emergency room at the closest medical facility.

## 2015-06-21 NOTE — Assessment & Plan Note (Signed)
Significant loss of about 30 pounds in 4 months equating to approximate 17% weight loss with mild changes in nutrition. BMI is 22 which is within the normal ranges. He is reportedly eating a regular diet but does have constipation. Discussed potential reasons for the weight loss.and this may be benign although underlying condition may exist. Obtain TSH, PSA, HIV, Hep C, CMET and chest x-ray. Continue current nutrition plan. Follow up with gastroenterology for the constipation and for colonoscopy. Discussed risks of stopping Plavix. Follow up pending imaging and blood work. Expect to monitor for the next 6 months as there is no specific identifiable cause at this time.

## 2015-06-21 NOTE — Progress Notes (Signed)
Pre visit review using our clinic review tool, if applicable. No additional management support is needed unless otherwise documented below in the visit note. 

## 2015-06-21 NOTE — Assessment & Plan Note (Signed)
Continues to experience constipation. Continue drinking plenty of fluids and stool softeners. Recommend increasing fiber in diet. OTC medications as needed for symptom relief and possible consider Amitiza or Linzess. Follow up with GI.

## 2015-06-22 LAB — HIV ANTIBODY (ROUTINE TESTING W REFLEX): HIV 1&2 Ab, 4th Generation: NONREACTIVE

## 2015-06-22 LAB — HEPATITIS C ANTIBODY: HCV Ab: REACTIVE — AB

## 2015-06-24 ENCOUNTER — Encounter: Payer: Self-pay | Admitting: Family

## 2015-06-24 DIAGNOSIS — B182 Chronic viral hepatitis C: Secondary | ICD-10-CM

## 2015-06-25 ENCOUNTER — Telehealth: Payer: Self-pay | Admitting: Family

## 2015-06-25 LAB — HEPATITIS C RNA QUANTITATIVE
HCV Quantitative Log: 6.11 {Log} — ABNORMAL HIGH (ref <1.18)
HCV Quantitative: 1289517 IU/mL — ABNORMAL HIGH (ref <15)

## 2015-06-25 NOTE — Telephone Encounter (Signed)
Spoke with pts wife about lab results.

## 2015-06-25 NOTE — Telephone Encounter (Signed)
Patients wife has called back.  Can reach at [phone] if not at home number.

## 2015-06-25 NOTE — Telephone Encounter (Signed)
Pt's wife called regarding pts lab results. I did read to her the email Marya Amsler had sent. She had further questions. Can you please call her back

## 2015-06-25 NOTE — Telephone Encounter (Signed)
Call cell at [phone]

## 2015-09-03 ENCOUNTER — Other Ambulatory Visit: Payer: Self-pay | Admitting: Family

## 2015-10-07 ENCOUNTER — Encounter: Payer: Self-pay | Admitting: Neurology

## 2015-10-07 ENCOUNTER — Ambulatory Visit (INDEPENDENT_AMBULATORY_CARE_PROVIDER_SITE_OTHER): Payer: BLUE CROSS/BLUE SHIELD | Admitting: Neurology

## 2015-10-07 VITALS — BP 132/69 | HR 67 | Ht 67.0 in | Wt 151.6 lb

## 2015-10-07 DIAGNOSIS — I63231 Cerebral infarction due to unspecified occlusion or stenosis of right carotid arteries: Secondary | ICD-10-CM | POA: Diagnosis not present

## 2015-10-07 DIAGNOSIS — F411 Generalized anxiety disorder: Secondary | ICD-10-CM

## 2015-10-07 MED ORDER — CITALOPRAM HYDROBROMIDE 10 MG PO TABS: 10.0000 mg | ORAL_TABLET | Freq: Every day | ORAL | Status: DC

## 2015-10-07 NOTE — Patient Instructions (Signed)
I had a long d/w patient, wife and daughter about his recent stroke, risk for recurrent stroke/TIAs, personally independently reviewed imaging studies and stroke evaluation results and answered questions.Continue Plavix  for secondary stroke prevention and maintain strict control of hypertension with blood pressure goal below 130/90, diabetes with hemoglobin A1c goal below 6.5% and lipids with LDL cholesterol goal below 70 mg/dL. I also advised the patient to eat a healthy diet with plenty of whole grains, cereals, fruits and vegetables, exercise regularly and maintain ideal body weight .Followup in the future with stroke nurse practitioner in  6 months or call earlier if necessary.  Generalized Anxiety Disorder Generalized anxiety disorder (GAD) is a mental disorder. It interferes with life functions, including relationships, work, and school. GAD is different from normal anxiety, which everyone experiences at some point in their lives in response to specific life events and activities. Normal anxiety actually helps [place] prepare for and get through these life events and activities. Normal anxiety goes away after the event or activity is over.  GAD causes anxiety that is not necessarily related to specific events or activities. It also causes excess anxiety in proportion to specific events or activities. The anxiety associated with GAD is also difficult to control. GAD can vary from mild to severe. People with severe GAD can have intense waves of anxiety with physical symptoms (panic attacks).  SYMPTOMS The anxiety and worry associated with GAD are difficult to control. This anxiety and worry are related to many life events and activities and also occur more days than not for 6 months or longer. People with GAD also have three or more of the following symptoms (one or more in children):  Restlessness.   Fatigue.  Difficulty concentrating.   Irritability.  Muscle tension.  Difficulty sleeping or  unsatisfying sleep. DIAGNOSIS GAD is diagnosed through an assessment by your health care provider. Your health care provider will ask you questions aboutyour mood,physical symptoms, and events in your life. Your health care provider may ask you about your medical history and use of alcohol or drugs, including prescription medicines. Your health care provider may also do a physical exam and blood tests. Certain medical conditions and the use of certain substances can cause symptoms similar to those associated with GAD. Your health care provider may refer you to a mental health specialist for further evaluation. TREATMENT The following therapies are usually used to treat GAD:   Medication. Antidepressant medication usually is prescribed for long-term daily control. Antianxiety medicines may be added in severe cases, especially when panic attacks occur.   Talk therapy (psychotherapy). Certain types of talk therapy can be helpful in treating GAD by providing support, education, and guidance. A form of talk therapy called cognitive behavioral therapy can teach you healthy ways to think about and react to daily life events and activities.  Stress managementtechniques. These include yoga, meditation, and exercise and can be very helpful when they are practiced regularly. A mental health specialist can help determine which treatment is best for you. Some people see improvement with one therapy. However, other people require a combination of therapies.   This information is not intended to replace advice given to you by your health care provider. Make sure you discuss any questions you have with your health care provider.   Document Released: 11/21/2012 Document Revised: 08/17/2014 Document Reviewed: 11/21/2012 Elsevier Interactive Patient Education [employer].

## 2015-10-07 NOTE — Progress Notes (Signed)
[facility] [address] [place]. [place] [address] [phone]       OFFICE FOLLOW-UP NOTE  [name] Date of Birth:  09/09/1950 Medical Record Number:  [place]:[id number]   HPI: [name] is a 52 year Caucasian male who seen today for first office follow-up visit following admission to [facility] on 01/26/15 for stroke. [name] is an 65 y.o. male with a history of hypertension transferred to [facility] from [facility] for evaluation of speech difficulties, headache (resolved), AMS, and balance issues. Most of the history was obtained from the patient's family including his wife, daughter, and son-in-law. Apparently the patient had been having difficulties with severe hypertension recently. He was seen at the urgent care center and by his physician and prescribed Lotensin as well as hydrochlorothiazide.  He had to have a dental extraction earlier the week prior to admission however the procedure was postponed secondary to elevated blood pressures. He returned on Thursday of that week and had 3 teeth pulled and was placed on amoxicillin, ibuprofen, and hydrocodone. On Friday he did not feel well; however, the family attributed this to having had his teeth pulled. When he went to bed  night  Prior the essentially seemed normal. The morning of admission he awoke at 7 AM with a severe headache. It was located over his right eye. He described it as dull and a 7 on a 1-10 scale. It lasted approximately one hour. He took several ibuprofen without relief. He denies taking any hydrocodone today. The patient's daughter reports that he had cluster headaches 3-4 years ago although these resolved. Today at approximately 10:30 the patient started to act strange, lethargic ,and was felt to be confused. He also had some balance problems and fell into a cabinet and bruised his forehead. He was brought to the emergency department for further evaluation. A CT scan was  performed that was consistent with an acute infarct. An MRI/MRA is pending. The emergency room department nurse examined him prior to his MRI and gave him an NIH score of 1.Date last known well: Date: 01/25/2015 Time last known well: Unable to determine tPA Given: No: Late presentation. Minimal deficits On exam he was found to have partial left homonymous hemianopsia and mild diminished fine motor skills in the left hand and mild dragging of the left leg only. MRI scan of the brain showed an acute infarct in the right middle cerebral artery territory involving posterior left insular and right parietal cortex. MRA showed occlusion of the proximal right internal carotid artery which was confirmed subsequently on CT angiogram. Transthoracic echo showed normal ejection fraction without cardiac source of embolism. Carotid ultrasound confirmed proximal right ICA occlusion. LDL cholesterol was elevated at 171 and hemoglobin A1c was normal at 5.8. Patient was started on aspirin and Plavix and did well. He states his speech has recovered completely. He is has noted improvement in his peripheral vision as well. He has no subjective weakness. He does complain of decreased stamina and energy and in fact his lost about 20 pounds and is concerned. His primary care physician is made and appointment for him to see a gastroenterologist on September 12. He also had rectal pain as well as bleeding. 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 Update 10/07/15 : He returns for follow-up after last visit 6 months ago. He is accompanied by his daughter and wife. Patient continues to have  issues with anxiety and does not socialize a lot. He procrastinates a lot about things that he can no longer do. 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 Patient feels frustrated and anxious about not  having anything to do. He has quit smoking and drinking completely. He states his blood pressure is quite good and today it is 132/69 in office. He remains on Plavix which is tolerating well with only minor bruising but no bleeding. He is tolerating Lipitor well without muscle aches and pains. He has not had any recurrent stroke or TIA symptoms. He still has some minimum left-sided peripheral vision loss but has been able to drive a little bit and is very careful about it. ROS:   14 system review of systems is positive for  fatigue, appetite and activity change,  memory loss  agitation, confusion, depression, nervousness and anxiety and all other systems negative. PMH:  Past Medical History  Diagnosis Date  . Hypertension   . Kidney stones   . Stroke ([place])   . Liver disease     hepatitis c    Social History:  Social History   Social History  . Marital Status: Married    Spouse Name: N/A  . Number of Children: 2  . Years of Education: 12   Occupational History  . Grading Contractor    Social History Main Topics  . Smoking status: Former Smoker    Quit date: 01/26/2015  . Smokeless tobacco: Never Used  . Alcohol Use: 0.6 oz/week    1 Cans of beer, 0 Standard drinks or equivalent per week     Comment: occasionally  . Drug Use: No  . Sexual Activity: Not on file   Other Topics Concern  . Not on file   Social History Narrative   Fun: Fish, collectable cars/classic cars   Denies religious beliefs effecting health care.     Medications:   Current Outpatient Prescriptions on File Prior to Visit  Medication Sig Dispense Refill  . atorvastatin (LIPITOR) 40 MG tablet TAKE ONE TABLET BY MOUTH ONCE DAILY AT  6  PM 30 tablet 3  . clopidogrel (PLAVIX) 75 MG tablet Take 1 tablet (75 mg total) by mouth daily. To be taken on daily basis along with aspirin for 3 months; then just plavix. 30 tablet 5  . hydrochlorothiazide (HYDRODIURIL) 25 MG tablet TAKE ONE TABLET BY MOUTH ONCE DAILY 30  tablet 3  . lisinopril (PRINIVIL,ZESTRIL) 20 MG tablet TAKE ONE TABLET BY MOUTH IN THE EVENING 30 tablet 3   No current facility-administered medications on file prior to visit.    Allergies:  No Known Allergies  Physical Exam General: well developed, well nourished middle aged Caucasian male, seated, in no evident distress Head: head normocephalic and atraumatic.  Neck: supple with no carotid or supraclavicular bruits Cardiovascular: regular rate and rhythm, no murmurs Musculoskeletal: no deformity Skin:  no rash/petichiae Vascular:  Normal pulses all extremities Filed Vitals:   10/07/15 0831  BP: 132/69  Pulse: 67   Neurologic Exam Mental Status: Awake and fully alert. Oriented to place and time. Recent and remote memory intact. Attention span, concentration and fund of knowledge appropriate. Mood and affect appropriate. On Hamilton anxiety scale he scored 23 suggestive of significant anxiety Cranial Nerves: Fundoscopic exam not done  . Pupils equal, briskly reactive to light. Extraocular movements full without nystagmus. Visual fields show partial left homonymous hemianopsia to  confrontation. Hearing intact. Facial sensation intact. Face, tongue, palate moves normally and symmetrically.  Motor: Normal bulk and tone. Normal strength in all tested extremity muscles. Sensory.: intact to touch ,pinprick .position and vibratory sensation.  Coordination: Rapid alternating movements normal in all extremities. Finger-to-nose and heel-to-shin performed accurately bilaterally. Gait and Station: Arises from chair without difficulty. Stance is normal. Gait demonstrates normal stride length and balance . Able to heel, toe and tandem walk without difficulty.  Reflexes: 1+ and symmetric. Toes downgoing.   NIHSS 1 Modified Rankin  2   ASSESSMENT: 33 year Caucasian male with right MCA branch infarct in June 2016 secondary to thromboembolism from proximal right ICA occlusion was doing quite well  with very mild left-sided vision deficits. Vascular risk factors of hypertension, hyperlipidemia and large vessel atherosclerosis. Significant underlying anxiety seems to be hampering his recovery    PLAN: I had a long d/w patient, wife and daughter about his recent stroke, risk for recurrent stroke/TIAs, personally independently reviewed imaging studies and stroke evaluation results and answered questions.Continue Plavix  for secondary stroke prevention and maintain strict control of hypertension with blood pressure goal below 130/90, diabetes with hemoglobin A1c goal below 6.5% and lipids with LDL cholesterol goal below 70 mg/dL. I also advised the patient to eat a healthy diet with plenty of whole grains, cereals, fruits and vegetables, exercise regularly and maintain ideal body weight .Followup in the future with stroke nurse practitioner in  6 months or call earlier if necessary. Antony Contras, MD Note: This document was prepared with digital dictation and possible smart phrase technology. Any transcriptional errors that result from this process are unintentional

## 2015-10-17 ENCOUNTER — Ambulatory Visit (INDEPENDENT_AMBULATORY_CARE_PROVIDER_SITE_OTHER): Payer: BLUE CROSS/BLUE SHIELD

## 2015-10-17 DIAGNOSIS — I63231 Cerebral infarction due to unspecified occlusion or stenosis of right carotid arteries: Secondary | ICD-10-CM | POA: Diagnosis not present

## 2015-11-19 ENCOUNTER — Other Ambulatory Visit: Payer: Self-pay | Admitting: Family

## 2015-11-20 ENCOUNTER — Telehealth: Payer: Self-pay | Admitting: Family

## 2015-11-20 DIAGNOSIS — R768 Other specified abnormal immunological findings in serum: Secondary | ICD-10-CM

## 2015-11-20 NOTE — Telephone Encounter (Signed)
Pt's wife called regarding pt needing to see a doctor for his Hep C. She doesn't remember who Marya Amsler told him to see.  She states he had to wait until the insurance kicks in. They have insurance now and are ready to schedule with whomever doctor Marya Amsler told him to see for this.  Please advise.

## 2015-11-20 NOTE — Telephone Encounter (Signed)
Can referral be placed for pt

## 2015-11-20 NOTE — Telephone Encounter (Signed)
Referral sent 

## 2015-11-25 NOTE — Telephone Encounter (Signed)
LVM informing pt

## 2015-11-25 NOTE — Telephone Encounter (Signed)
Spouse called back.  Will be looking for referral call.

## 2015-12-23 ENCOUNTER — Telehealth: Payer: Self-pay | Admitting: Family

## 2015-12-23 NOTE — Telephone Encounter (Signed)
Pt wife called in said that pt has HEP C and just not has ins.  She would like to talk to nurse about treatment of this.   Best number [phone] Home [phone]

## 2015-12-23 NOTE — Telephone Encounter (Signed)
Does this mean that he does not have insurance or that he now has insurance. He will need to be referred to the liver clinic for treatment which is our infectious disease office.

## 2015-12-23 NOTE — Telephone Encounter (Signed)
Please advise 

## 2015-12-24 NOTE — Telephone Encounter (Signed)
LVM for pt to call back.

## 2015-12-26 ENCOUNTER — Telehealth: Payer: Self-pay

## 2015-12-26 NOTE — Telephone Encounter (Signed)
Spoke with atient's wife LouAnn informed of appointment with Dr Linus Salmons for treatment of Hepatitis C.  Per Dr Linus Salmons it is ok for patient to come without prior labs.    Laverle Patter, RN

## 2015-12-27 ENCOUNTER — Other Ambulatory Visit: Payer: Self-pay | Admitting: Family

## 2016-01-08 ENCOUNTER — Ambulatory Visit (INDEPENDENT_AMBULATORY_CARE_PROVIDER_SITE_OTHER): Payer: BLUE CROSS/BLUE SHIELD | Admitting: Internal Medicine

## 2016-01-08 ENCOUNTER — Encounter: Payer: Self-pay | Admitting: Internal Medicine

## 2016-01-08 VITALS — BP 122/64 | HR 61 | Temp 97.6°F | Ht 65.0 in | Wt 155.0 lb

## 2016-01-08 DIAGNOSIS — B182 Chronic viral hepatitis C: Secondary | ICD-10-CM | POA: Diagnosis not present

## 2016-01-08 DIAGNOSIS — Z8619 Personal history of other infectious and parasitic diseases: Secondary | ICD-10-CM | POA: Insufficient documentation

## 2016-01-08 DIAGNOSIS — F418 Other specified anxiety disorders: Secondary | ICD-10-CM

## 2016-01-08 DIAGNOSIS — F329 Major depressive disorder, single episode, unspecified: Secondary | ICD-10-CM

## 2016-01-08 DIAGNOSIS — F32A Depression, unspecified: Secondary | ICD-10-CM

## 2016-01-08 HISTORY — DX: Other specified anxiety disorders: F41.8

## 2016-01-08 NOTE — Patient Instructions (Signed)
Date 01/08/2016  Dear Ray Williams, As discussed in the [facility], your hepatitis C therapy will include the following medications:          Harvoni 90mg /400mg  tablet or Epclusa:           Take 1 tablet by mouth once daily   Please note that ALL MEDICATIONS WILL START ON THE SAME DATE for a total of 12 weeks. ---------------------------------------------------------------- Your HCV Treatment Start Date: TBA   Your HCV genotype:  TBD    Liver Fibrosis: TBD    ---------------------------------------------------------------- YOUR PHARMACY CONTACT:   [place] [facility] [place] Phone: [phone] Hours: Monday to Friday 7:30 am to 6:00 pm   Please always contact your pharmacy at least 3-4 business days before you run out of medications to ensure your next month's medication is ready or 1 week prior to running out if you receive it by mail.  Remember, each prescription is for 28 days. ---------------------------------------------------------------- GENERAL NOTES REGARDING YOUR HEPATITIS C MEDICATION:  SOFOSBUVIR/LEDIPASVIR (HARVONI): - Harvoni tablet is taken daily with OR without food. - The tablets are orange. - The tablets should be stored at room temperature.  - Acid reducing agents such as H2 blockers (ie. Pepcid (famotidine), Zantac (ranitidine), Tagamet (cimetidine), Axid (nizatidine) and proton pump inhibitors (ie. Prilosec (omeprazole), Protonix (pantoprazole), Nexium (esomeprazole), or Aciphex (rabeprazole)) can decrease effectiveness of Harvoni. Do not take until you have discussed with a health care provider.    -Antacids that contain magnesium and/or aluminum hydroxide (ie. Milk of Magensia, Rolaids, Gaviscon, Maalox, Mylanta, an dArthritis Pain Formula)can reduce absorption of Harvoni, so take them at least 4 hours before or after Harvoni.  -Calcium carbonate (calcium supplements or antacids such as Tums,  Caltrate, Os-Cal)needs to be taken at least 4 hours hours before or after Harvoni.  -St. John's wort or any products that contain St. John's wort like some herbal supplements  Please inform the office prior to starting any of these medications.  - The common side effects associated with Harvoni include:      1. Fatigue      2. Headache      3. Nausea      4. Diarrhea      5. Insomnia  Please note that this only lists the most common side effects and is NOT a comprehensive list of the potential side effects of these medications. For more information, please review the drug information sheets that come with your medication package from the pharmacy.  ---------------------------------------------------------------- GENERAL HELPFUL HINTS ON HCV THERAPY: 1. Stay well-hydrated. 2. Notify the [facility] of any changes in your other over-the-counter/herbal or prescription medications. 3. If you miss a dose of your medication, take the missed dose as soon as you remember. Return to your regular time/dose schedule the next day.  4.  Do not stop taking your medications without first talking with your healthcare provider. 5.  You may take Tylenol (acetaminophen), as long as the dose is less than 2000 mg (OR no more than 4 tablets of the Tylenol Extra Strengths 500mg  tablet) in 24 hours. 6.  You will see our pharmacist-specialist within the first 2 weeks of starting your medication. 7.  You will need to obtain routine labs around week 4 and12 weeks after starting and then 3 to 6 months after finishing Harvoni.    Ray Williams, [facility]  [place], [place]  [address] [phone]

## 2016-01-08 NOTE — Progress Notes (Signed)
[facility]ious [facility]   CC: consideration for treatment for chronic hepatitis C  HPI:  +Ray Williams is a 66 y.o. male who presents for initial evaluation and management of chronic hepatitis C.  Patient tested positive earlier this year. Hepatitis C-associated risk factors present are: none. Patient denies history of blood transfusion, intranasal drug use, IV drug abuse, renal dialysis, sexual contact with person with liver disease, tattoos. Patient has not had other studies performed. Results: hepatitis C RNA by PCR, result: positive. Patient has not had prior treatment for Hepatitis C. Patient does not have a past history of liver disease. Patient does not have a family history of liver disease. Patient does not  have associated signs or symptoms related to liver disease.  Labs reviewed and confirm chronic hepatitis C with a positive viral load.   Records reviewed from PCP.  Recent stroke and patient with depression.  No alcohol.       Patient does not have documented immunity to Hepatitis A. Patient does not have documented immunity to Hepatitis B.    Review of Systems:   Constitutional: positive for fatigue and malaise or negative for anorexia Gastrointestinal: negative for diarrhea Musculoskeletal: negative for myalgias and arthralgias All other systems reviewed and are negative      Past Medical History  Diagnosis Date  . Hypertension   . Kidney stones   . Stroke ([place])   . Liver disease     hepatitis c    Prior to Admission medications   Medication Sig Start Date End Date Taking? Authorizing Provider  atorvastatin (LIPITOR) 40 MG tablet TAKE ONE TABLET BY MOUTH ONCE DAILY AT  6  PM 09/04/15  Yes Golden Circle, FNP  citalopram (CELEXA) 10 MG tablet Take 1 tablet (10 mg total) by mouth daily. 10/07/15  Yes Garvin Fila, MD  clopidogrel (PLAVIX) 75 MG tablet Take 1 tablet (75 mg total) by mouth once. 12/27/15  Yes Golden Circle, FNP    hydrochlorothiazide (HYDRODIURIL) 25 MG tablet TAKE ONE TABLET BY MOUTH ONCE DAILY 09/04/15  Yes Golden Circle, FNP  lisinopril (PRINIVIL,ZESTRIL) 20 MG tablet TAKE ONE TABLET BY MOUTH IN THE EVENING 09/04/15  Yes Golden Circle, FNP    No Known Allergies  Social History  Substance Use Topics  . Smoking status: Former Smoker    Quit date: 01/26/2015  . Smokeless tobacco: Never Used  . Alcohol Use: 0.6 oz/week    1 Cans of beer, 0 Standard drinks or equivalent per week     Comment: rarely    Family History  Problem Relation Age of Onset  . Heart disease Mother   . Heart attack Mother   . Healthy Father   . Healthy Maternal Grandmother   . Healthy Maternal Grandfather   . Healthy Paternal Grandmother   . Healthy Paternal Grandfather   no cirrhosis, no liver cancer   Objective:  Constitutional: in no apparent distress and alert,  Filed Vitals:   01/08/16 0856  BP: 122/64  Pulse: 61  Temp: 97.6 F (36.4 C)   Eyes: anicteric Cardiovascular: Cor RRR and No murmurs Respiratory: CTA B; normal respiratory effort Gastrointestinal: Bowel sounds are normal, liver is not enlarged, spleen is not enlarged Musculoskeletal: no pedal edema noted Skin: negative for - jaundice, spider hemangioma, telangiectasia, palmar erythema, ecchymosis and atrophy; no porphyria cutanea tarda Lymphatic: no cervical lymphadenopathy   Laboratory Genotype: No results found for: HCVGENOTYPE HCV viral load:  Lab Results  Component Value Date   HCVQUANT [id number]* 06/21/2015   Lab Results  Component Value Date   WBC 9.0 04/22/2015   HGB 14.1 04/22/2015   HCT 41.2 04/22/2015   MCV 89.0 04/22/2015   PLT 307.0 04/22/2015    Lab Results  Component Value Date   CREATININE 1.35 06/21/2015   BUN 21 06/21/2015   NA 135 06/21/2015   K 4.5 06/21/2015   CL 97 06/21/2015   CO2 29 06/21/2015    Lab Results  Component Value Date   ALT 39 06/21/2015   AST 14 06/21/2015   ALKPHOS 60 06/21/2015      Labs and history reviewed and show CHILD-PUGH A  5-6 points: Child class A 7-9 points: Child class B 10-15 points: Child class C  Lab Results  Component Value Date   INR 0.97 04/05/2015   BILITOT 0.7 06/21/2015   ALBUMIN 4.7 06/21/2015     Assessment: New Patient with Chronic Hepatitis C genotype unknown, untreated.  I discussed with the patient the lab findings that confirm chronic hepatitis C as well as the natural history and progression of disease including about 30% of people who develop cirrhosis of the liver if left untreated and once cirrhosis is established there is a 2-7% risk per year of liver cancer and liver failure.  I discussed the importance of treatment and benefits in reducing the risk, even if significant liver fibrosis exists.  Also with depression from recent stroke and seen by our counselor.  Plan: 1) Patient counseled extensively on limiting acetaminophen to no more than 2 grams daily, avoidance of alcohol. 2) Transmission discussed with patient including sexual transmission, sharing razors and toothbrush.   3) Will need referral to gastroenterology if concern for cirrhosis 4) Will need referral for substance abuse counseling: No.; Further work up to include urine drug screen  No. 5) Will prescribe Harvoni for 12 weeks 6) Hepatitis A vaccine No. 7) Hepatitis B vaccine No.will check labs today 8) Pneumovax vaccine if concern for cirrhosis 9) Further work up to include liver staging with elastography 10) will follow up after starting medication 11) depression counseling. 12) 40 mg of atorvastatin with a history of a stroke.  I will have him continue with 40 mg and discussed letting us know of any muscle aches.

## 2016-01-09 LAB — COMPLETE METABOLIC PANEL WITH GFR
ALT: 60 U/L — AB (ref 9–46)
AST: 20 U/L (ref 10–35)
Albumin: 3.8 g/dL (ref 3.6–5.1)
Alkaline Phosphatase: 79 U/L (ref 40–115)
BUN: 25 mg/dL (ref 7–25)
CHLORIDE: 105 mmol/L (ref 98–110)
CO2: 27 mmol/L (ref 20–31)
CREATININE: 1.07 mg/dL (ref 0.70–1.25)
Calcium: 9.4 mg/dL (ref 8.6–10.3)
GFR, EST AFRICAN AMERICAN: 85 mL/min (ref >=60)
GFR, EST NON AFRICAN AMERICAN: 73 mL/min (ref >=60)
Glucose, Bld: 76 mg/dL (ref 65–99)
POTASSIUM: 5.4 mmol/L — AB (ref 3.5–5.3)
Sodium: 139 mmol/L (ref 135–146)
Total Bilirubin: 0.4 mg/dL (ref 0.2–1.2)
Total Protein: 6.7 g/dL (ref 6.1–8.1)

## 2016-01-09 LAB — CBC WITH DIFFERENTIAL/PLATELET
BASOS PCT: 0 %
Basophils Absolute: 0 cells/uL (ref 0–200)
EOS PCT: 1 %
Eosinophils Absolute: 92 cells/uL (ref 15–500)
HCT: 40.1 % (ref 38.5–50.0)
HEMOGLOBIN: 13.1 g/dL — AB (ref 13.2–17.1)
LYMPHS ABS: 1564 {cells}/uL (ref 850–3900)
Lymphocytes Relative: 17 %
MCH: 30.5 pg (ref 27.0–33.0)
MCHC: 32.7 g/dL (ref 32.0–36.0)
MCV: 93.5 fL (ref 80.0–100.0)
MPV: 9 fL (ref 7.5–12.5)
Monocytes Absolute: 368 cells/uL (ref 200–950)
Monocytes Relative: 4 %
NEUTROS ABS: 7176 {cells}/uL (ref 1500–7800)
Neutrophils Relative %: 78 %
PLATELETS: 431 10*3/uL — AB (ref 140–400)
RBC: 4.29 MIL/uL (ref 4.20–5.80)
RDW: 13.4 % (ref 11.0–15.0)
WBC: 9.2 10*3/uL (ref 3.8–10.8)

## 2016-01-09 LAB — PROTIME-INR
INR: 0.96 (ref <1.50)
Prothrombin Time: 12.9 seconds (ref 11.6–15.2)

## 2016-01-09 LAB — HEPATITIS B SURFACE ANTIBODY,QUALITATIVE: HEP B S AB: NEGATIVE

## 2016-01-09 LAB — HEPATITIS B SURFACE ANTIGEN: Hepatitis B Surface Ag: NEGATIVE

## 2016-01-09 LAB — HIV ANTIBODY (ROUTINE TESTING W REFLEX): HIV 1&2 Ab, 4th Generation: NONREACTIVE

## 2016-01-09 LAB — HEPATITIS B CORE ANTIBODY, TOTAL: Hep B Core Total Ab: REACTIVE — AB

## 2016-01-09 LAB — CK: Total CK: 44 U/L (ref 7–232)

## 2016-01-09 LAB — HEPATITIS A ANTIBODY, TOTAL: HEP A TOTAL AB: NONREACTIVE

## 2016-01-10 LAB — LIVER FIBROSIS, FIBROTEST-ACTITEST
ALT: 58 U/L — ABNORMAL HIGH (ref 9–46)
Alpha-2-Macroglobulin: 229 mg/dL (ref 106–279)
Apolipoprotein A1: 124 mg/dL (ref 94–176)
Bilirubin: 0.4 mg/dL (ref 0.2–1.2)
Fibrosis Score: 0.37
GGT: 77 U/L — ABNORMAL HIGH (ref 3–70)
Haptoglobin: 279 mg/dL — ABNORMAL HIGH (ref 43–212)
NECROINFLAMMAT ACT SCORE: 0.37
REFERENCE ID: 1538219

## 2016-01-11 LAB — HCV RNA,LIPA RFLX NS5A DRUG RESIST

## 2016-01-17 ENCOUNTER — Ambulatory Visit: Payer: BLUE CROSS/BLUE SHIELD | Admitting: *Deleted

## 2016-01-20 ENCOUNTER — Other Ambulatory Visit: Payer: Self-pay | Admitting: Family

## 2016-01-29 ENCOUNTER — Ambulatory Visit (HOSPITAL_COMMUNITY)
Admission: RE | Admit: 2016-01-29 | Discharge: 2016-01-29 | Disposition: A | Discharge status: Home / Self Care | Payer: BLUE CROSS/BLUE SHIELD | Source: Ambulatory Visit | Attending: Internal Medicine | Admitting: Internal Medicine

## 2016-01-29 DIAGNOSIS — B182 Chronic viral hepatitis C: Secondary | ICD-10-CM | POA: Insufficient documentation

## 2016-01-29 DIAGNOSIS — N281 Cyst of kidney, acquired: Secondary | ICD-10-CM | POA: Diagnosis not present

## 2016-02-13 ENCOUNTER — Telehealth: Payer: Self-pay | Admitting: Neurology

## 2016-02-13 NOTE — Telephone Encounter (Signed)
Rn call August with Dr. Kyra Searles office and they were close for the day.

## 2016-02-13 NOTE — Telephone Encounter (Signed)
August with Dr. Lanny Hurst McDonald's office is calling to ask if the patient can go off medication  clopidogrel (PLAVIX) 75 MG tablet for 3 days for dental treatment. Please call and advise August at [phone].

## 2016-02-14 NOTE — Telephone Encounter (Signed)
Rn tried to call DR Kyra Searles office and they were close today. Hours are 0800 to 1200pm.

## 2016-02-17 NOTE — Telephone Encounter (Signed)
Rn call August at dental office and she was out of the office this week. Rn got [place] on the phone another Art therapist. Rn stated a clearance form will be fax today for patient. Letter was fax to [phone]. Fax was receive and confirmed.

## 2016-02-27 ENCOUNTER — Other Ambulatory Visit: Payer: Self-pay | Admitting: Internal Medicine

## 2016-02-27 ENCOUNTER — Telehealth: Payer: Self-pay

## 2016-02-27 MED ORDER — LEDIPASVIR-SOFOSBUVIR 90-400 MG PO TABS: 1.0000 | ORAL_TABLET | Freq: Every day | ORAL | Status: DC

## 2016-02-27 NOTE — Telephone Encounter (Signed)
Patient called wanting to know about the results of the elastography scan, and if they needed a follow up appointment. Notified pharmacy technician, Inez Catalina, that said patient was to see counseling as well. Patient's wife stated patient missed appointment due to her having a dentist appointment at the same time as counseling. Patient's wife thinks that he has depression from having hepatitis C.  Reschedule counseling appointment made with Leveda Anna 7/31. Follow up appointment made with Dr. Linus Salmons on 08/22. Patient wife would like for Dr. Linus Salmons to call her with results whenever he has time. Explained to patient and wife Dr. Linus Salmons is not in clinic this week, and LPN will send Dr. Linus Salmons a note and once a response is given, they will hear from the clinic. Patient stated understanding. Rodman Key, LPN

## 2016-02-27 NOTE — Telephone Encounter (Signed)
It does not show cirrhosis on the ultrasound but a signficant amount of scarring that can lead to cirrhosis soon with continued alcohol use, if he drinks.  I will check with Inez Catalina as well about the Hep C treatment. thanks

## 2016-02-28 NOTE — Telephone Encounter (Signed)
Called and spoke with patient's wife to read her Dr. Henreitta Leber response. Patient verbalized understanding response. Wife explained that patient never a heavy drinker and only had a beer on vacation and socially. Explained to wife that patient should stop drinking alcohol all together. Wife stated they are currently working on stopping all alcohol. Explained to wife that pharmacy will call her once medication is approved. Wife verbalized understanding. Rodman Key, LPN

## 2016-03-10 ENCOUNTER — Other Ambulatory Visit: Payer: Self-pay | Admitting: Pharmacist Clinician (PhC)/ Clinical Pharmacy Specialist

## 2016-03-10 MED ORDER — LEDIPASVIR-SOFOSBUVIR 90-400 MG PO TABS: 1.0000 | ORAL_TABLET | Freq: Every day | ORAL | 2 refills | Status: DC

## 2016-03-23 ENCOUNTER — Other Ambulatory Visit: Payer: Self-pay | Admitting: Neurology

## 2016-03-23 DIAGNOSIS — I63231 Cerebral infarction due to unspecified occlusion or stenosis of right carotid arteries: Secondary | ICD-10-CM

## 2016-03-23 DIAGNOSIS — F411 Generalized anxiety disorder: Secondary | ICD-10-CM

## 2016-03-24 ENCOUNTER — Other Ambulatory Visit: Payer: Self-pay

## 2016-03-24 ENCOUNTER — Encounter: Payer: Self-pay | Admitting: Pharmacy Technician

## 2016-03-24 DIAGNOSIS — I63231 Cerebral infarction due to unspecified occlusion or stenosis of right carotid arteries: Secondary | ICD-10-CM

## 2016-03-24 DIAGNOSIS — F411 Generalized anxiety disorder: Secondary | ICD-10-CM

## 2016-03-24 MED ORDER — CITALOPRAM HYDROBROMIDE 10 MG PO TABS: 10.0000 mg | ORAL_TABLET | Freq: Every day | ORAL | 0 refills | Status: DC

## 2016-03-31 ENCOUNTER — Encounter: Payer: Self-pay | Admitting: Internal Medicine

## 2016-03-31 ENCOUNTER — Ambulatory Visit (INDEPENDENT_AMBULATORY_CARE_PROVIDER_SITE_OTHER): Payer: BLUE CROSS/BLUE SHIELD | Admitting: Internal Medicine

## 2016-03-31 VITALS — BP 93/56 | HR 61 | Temp 97.9°F | Wt 156.0 lb

## 2016-03-31 DIAGNOSIS — Z23 Encounter for immunization: Secondary | ICD-10-CM | POA: Diagnosis not present

## 2016-03-31 DIAGNOSIS — B182 Chronic viral hepatitis C: Secondary | ICD-10-CM | POA: Diagnosis not present

## 2016-03-31 DIAGNOSIS — I63311 Cerebral infarction due to thrombosis of right middle cerebral artery: Secondary | ICD-10-CM | POA: Diagnosis not present

## 2016-03-31 DIAGNOSIS — K7460 Unspecified cirrhosis of liver: Secondary | ICD-10-CM

## 2016-04-01 NOTE — Progress Notes (Signed)
   Subjective:    Patient ID: Ray Williams, male    DOB: 1950-11-06, 65 y.o.   MRN: [id number]  HPI Here for follow up of HCV.   Has genotype 1b, viral load 1.2 million, elastography with F3/4, no cirrhosis on ultrasound though now on Harvoni for 2 weeks.  Will get 12 weeks.  Occasional alcohol but now going to quit.  On atorvastatin 40 mg and not reduced due to recent CVA.  No myalgias.  Hepatitis B core positive, hepatitis A non immune.  Some mild fatigue.  No weight loss.    Review of Systems  Constitutional: Positive for fatigue.  Gastrointestinal: Negative for diarrhea.  Skin: Negative for rash.  Neurological: Positive for headaches.       Objective:   Physical Exam  Constitutional: He appears well-developed and well-nourished. No distress.  Eyes: No scleral icterus.  Cardiovascular: Normal rate, regular rhythm and normal heart sounds.   No murmur heard. Skin: No rash noted.    Social History   Social History  . Marital status: Married    Spouse name: N/A  . Number of children: 2  . Years of education: 12   Occupational History  . Grading Contractor    Social History Main Topics  . Smoking status: Former Smoker    Quit date: 01/26/2015  . Smokeless tobacco: Never Used  . Alcohol use No     Comment: rarely  . Drug use: No  . Sexual activity: Not Currently   Other Topics Concern  . Not on file   Social History Narrative   Fun: Fish, collectable cars/classic cars   Denies religious beliefs effecting health care.        Assessment & Plan:

## 2016-04-01 NOTE — Assessment & Plan Note (Signed)
With history of stroke, I will keep him on 40 mg atorvastatin and he knows to call if he develops myalgias.

## 2016-04-01 NOTE — Assessment & Plan Note (Signed)
I will refer to GI for ? EGD Pneumovax HCC screening every 6 months, next due in December

## 2016-04-01 NOTE — Assessment & Plan Note (Signed)
Will schedule for labs in 2 weeks and after treatment.

## 2016-04-07 ENCOUNTER — Telehealth: Payer: Self-pay | Admitting: Neurology

## 2016-04-07 ENCOUNTER — Ambulatory Visit (INDEPENDENT_AMBULATORY_CARE_PROVIDER_SITE_OTHER): Payer: BLUE CROSS/BLUE SHIELD | Admitting: Neurology

## 2016-04-07 ENCOUNTER — Encounter: Payer: Self-pay | Admitting: Neurology

## 2016-04-07 VITALS — BP 117/56 | HR 59 | Ht 65.0 in | Wt 158.0 lb

## 2016-04-07 DIAGNOSIS — R0989 Other specified symptoms and signs involving the circulatory and respiratory systems: Secondary | ICD-10-CM

## 2016-04-07 DIAGNOSIS — E785 Hyperlipidemia, unspecified: Secondary | ICD-10-CM

## 2016-04-07 NOTE — Patient Instructions (Addendum)
I had a long d/w patient and his wife about his remote stroke,carotid occlusion and stenosis, risk for recurrent stroke/TIAs, personally independently reviewed imaging studies and stroke evaluation results and answered questions.Continue Plavixfor secondary stroke prevention and maintain strict control of hypertension with blood pressure goal below 130/90, diabetes with hemoglobin A1c goal below 6.5% and lipids with LDL cholesterol goal below 70 mg/dL. I also advised the patient to eat a healthy diet with plenty of whole grains, cereals, fruits and vegetables, exercise regularly and maintain ideal body weight. I again counseled him to be careful with his driving particularly when changing lanes to the left hand and dissections due to his visual field deficit on the left. Plan to check follow-up carotid ultrasound and transcranial doppler studies as well as lipid profile and hemoglobin A1c. Followup in the future with my nurse practitioner in 6 months or call earlier if necessary

## 2016-04-07 NOTE — Progress Notes (Signed)
Ray Williams [address] [place]. [place] [address] [phone]       OFFICE FOLLOW-UP NOTE  [name] Date of Birth:  09/09/1950 Medical Record Number:  [id number]   HPI: Mr Ray Williams is a 52 year Caucasian male who seen today for first office follow-up visit following admission to [facility] on 01/26/15 for stroke. Ray Williams is an 65 y.o. male with a history of hypertension transferred to [facility] from [facility] for evaluation of speech difficulties, headache (resolved), AMS, and balance issues. Most of the history was obtained from the patient's family including his wife, daughter, and son-in-law. Apparently the patient had been having difficulties with severe hypertension recently. He was seen at the urgent care center and by his physician and prescribed Lotensin as well as hydrochlorothiazide.  He had to have a dental extraction earlier the week prior to admission however the procedure was postponed secondary to elevated blood pressures. He returned on Thursday of that week and had 3 teeth pulled and was placed on amoxicillin, ibuprofen, and hydrocodone. On Friday he did not feel well; however, the family attributed this to having had his teeth pulled. When he went to bed  night  Prior the essentially seemed normal. The morning of admission he awoke at 7 AM with a severe headache. It was located over his right eye. He described it as dull and a 7 on a 1-10 scale. It lasted approximately one hour. He took several ibuprofen without relief. He denies taking any hydrocodone today. The patient's daughter reports that he had cluster headaches 3-4 years ago although these resolved. Today at approximately 10:30 the patient started to act strange, lethargic ,and was felt to be confused. He also had some balance problems and fell into a cabinet and bruised his forehead. He was brought to the emergency department for further evaluation. A CT scan was  performed that was consistent with an acute infarct. An MRI/MRA is pending. The emergency room department nurse examined him prior to his MRI and gave him an NIH score of 1.Date last known well: Date: 01/25/2015 Time last known well: Unable to determine tPA Given: No: Late presentation. Minimal deficits On exam he was found to have partial left homonymous hemianopsia and mild diminished fine motor skills in the left hand and mild dragging of the left leg only. MRI scan of the brain showed an acute infarct in the right middle cerebral artery territory involving posterior left insular and right parietal cortex. MRA showed occlusion of the proximal right internal carotid artery which was confirmed subsequently on CT angiogram. Transthoracic echo showed normal ejection fraction without cardiac source of embolism. Carotid ultrasound confirmed proximal right ICA occlusion. LDL cholesterol was elevated at 171 and hemoglobin A1c was normal at 5.8. Patient was started on aspirin and Plavix and did well. He states his speech has recovered completely. He is has noted improvement in his peripheral vision as well. He has no subjective weakness. He does complain of decreased stamina and energy and in fact his lost about 20 pounds and is concerned. His primary care physician is made and appointment for him to see a gastroenterologist on September 12. He also had rectal pain as well as bleeding. 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 Update 10/07/15 : He returns for follow-up after last visit 6 months ago. He is accompanied by his daughter and wife. Patient continues to have  issues with anxiety and does not socialize a lot. He procrastinates a lot about things that he can no longer do. 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 Patient feels frustrated and anxious about not  having anything to do. He has quit smoking and drinking completely. He states his blood pressure is quite good and today it is 132/69 in office. He remains on Plavix which is tolerating well with only minor bruising but no bleeding. He is tolerating Lipitor well without muscle aches and pains. He has not had any recurrent stroke or TIA symptoms. He still has some minimum left-sided peripheral vision loss but has been able to drive a little bit and is very careful about it. Update 04/07/2016 ; He returns for follow-up after last visit 6 months ago. He continues to have left-sided peripheral vision defect which has not improved completely but is better. He is had no recurrent stroke or TIA symptoms. He had follow-up carotid ultrasound done on 3/9 for 17 which shows chronic right ICA occlusion and 50-69% left ICA stenosis. He states blood pressure is good and today it is 117/56. He has been driving carefully and has not had any accidents. He has been seeing Dr. Linus Salmons from infectious disease for hepatitis treatment. He feels the Celexa which was started at last visit seems to have helped his anxiety. He has no other new complaints today. ROS:   14 system review of systems is positive for   nervousness and anxiety and all other systems negative. PMH:  Past Medical History:  Diagnosis Date  . Hypertension   . Kidney stones   . Liver disease    hepatitis c  . Stroke [facility] Williams)     Social History:  Social History   Social History  . Marital status: Married    Spouse name: N/A  . Number of children: 2  . Years of education: 12   Occupational History  . Grading Contractor    Social History Main Topics  . Smoking status: Former Smoker    Quit date: 01/26/2015  . Smokeless tobacco: Never Used  . Alcohol use No     Comment: rarely  . Drug use: No  . Sexual activity: Not Currently   Other Topics Concern  . Not on file   Social History Narrative   Fun: Fish, collectable cars/classic cars   Denies  religious beliefs effecting health care.     Medications:   Current Outpatient Prescriptions on File Prior to Visit  Medication Sig Dispense Refill  . atorvastatin (LIPITOR) 40 MG tablet TAKE ONE TABLET BY MOUTH ONCE DAILY AT  6  PM 30 tablet 4  . citalopram (CELEXA) 10 MG tablet Take 1 tablet (10 mg total) by mouth daily. 30 tablet 0  . clopidogrel (PLAVIX) 75 MG tablet Take 1 tablet (75 mg total) by mouth once. 30 tablet 3  . hydrochlorothiazide (HYDRODIURIL) 25 MG tablet TAKE ONE TABLET BY MOUTH ONCE DAILY 30 tablet 4  . Ledipasvir-Sofosbuvir (HARVONI) 90-400 MG TABS Take 1 tablet by mouth daily. 28 tablet 2  . lisinopril (PRINIVIL,ZESTRIL) 20 MG tablet TAKE ONE TABLET BY MOUTH IN THE EVENING 30 tablet 4   No current facility-administered medications on file prior to visit.     Allergies:  No Known Allergies  Physical Exam General: well developed, well nourished middle aged Caucasian male, seated, in no evident distress Head: head normocephalic and atraumatic.  Neck:  supple withSoft left carotid   bruit Cardiovascular: regular rate and rhythm, no murmurs Musculoskeletal: no deformity Skin:  no rash/petichiae Vascular:  Normal pulses all extremities Vitals:   04/07/16 0941  BP: (!) 117/56  Pulse: (!) 59   Neurologic Exam Mental Status: Awake and fully alert. Oriented to place and time. Recent and remote memory intact. Attention span, concentration and fund of knowledge appropriate. Mood and affect appropriate.  Cranial Nerves: Fundoscopic exam not done  . Pupils equal, briskly reactive to light. Extraocular movements full without nystagmus. Visual fields show partial left homonymous hemianopsia to confrontation. Hearing intact. Facial sensation intact. Face, tongue, palate moves normally and symmetrically.  Motor: Normal bulk and tone. Normal strength in all tested extremity muscles. Sensory.: intact to touch ,pinprick .position and vibratory sensation.  Coordination: Rapid  alternating movements normal in all extremities. Finger-to-nose and heel-to-shin performed accurately bilaterally. Gait and Station: Arises from chair without difficulty. Stance is normal. Gait demonstrates normal stride length and balance . Able to heel, toe and tandem walk without difficulty.  Reflexes: 1+ and symmetric. Toes downgoing.   NIHSS 1 Modified Rankin  2   ASSESSMENT: 27 year Caucasian male with right MCA branch infarct in June 2016 secondary to thromboembolism from proximal right ICA occlusion was doing quite well with very mild left-sided vision deficits. Vascular risk factors of hypertension, hyperlipidemia and large vessel atherosclerosis.    PLAN: I had a long d/w patient and his wife about his remote stroke,carotid occlusion and stenosis, risk for recurrent stroke/TIAs, personally independently reviewed imaging studies and stroke evaluation results and answered questions.Continue Plavixfor secondary stroke prevention and maintain strict control of hypertension with blood pressure goal below 130/90, diabetes with hemoglobin A1c goal below 6.5% and lipids with LDL cholesterol goal below 70 mg/dL. I also advised the patient to eat a healthy diet with plenty of whole grains, cereals, fruits and vegetables, exercise regularly and maintain ideal body weight. I again counseled him to be careful with his driving particularly when changing lanes to the left hand and dissections due to his visual field deficit on the left. Plan to check follow-up carotid ultrasound and transcranial doppler studies as well as lipid profile and hemoglobin A1c. Greater than 50% time during this 25 minute visit was spent on counseling and coordination of care about carotid stenosis and stroke risk Followup in the future with my nurse practitioner in 6 months or call earlier if necessary. Antony Contras, MD Note: This document was prepared with digital dictation and possible smart phrase technology. Any  transcriptional errors that result from this process are unintentional

## 2016-04-07 NOTE — Telephone Encounter (Signed)
Patient is ready to be scheduled for Doppler no PA needed. Thanks Hinton Dyer

## 2016-04-14 ENCOUNTER — Other Ambulatory Visit: Payer: Self-pay | Admitting: Family

## 2016-04-14 ENCOUNTER — Other Ambulatory Visit: Payer: Self-pay | Admitting: Neurology

## 2016-04-14 DIAGNOSIS — F411 Generalized anxiety disorder: Secondary | ICD-10-CM

## 2016-04-14 DIAGNOSIS — I63231 Cerebral infarction due to unspecified occlusion or stenosis of right carotid arteries: Secondary | ICD-10-CM

## 2016-04-20 ENCOUNTER — Other Ambulatory Visit: Payer: Self-pay | Admitting: Neurology

## 2016-04-20 DIAGNOSIS — F411 Generalized anxiety disorder: Secondary | ICD-10-CM

## 2016-04-20 DIAGNOSIS — I63231 Cerebral infarction due to unspecified occlusion or stenosis of right carotid arteries: Secondary | ICD-10-CM

## 2016-04-22 ENCOUNTER — Ambulatory Visit (INDEPENDENT_AMBULATORY_CARE_PROVIDER_SITE_OTHER): Payer: BLUE CROSS/BLUE SHIELD

## 2016-04-22 DIAGNOSIS — Z0289 Encounter for other administrative examinations: Secondary | ICD-10-CM

## 2016-04-22 DIAGNOSIS — R0989 Other specified symptoms and signs involving the circulatory and respiratory systems: Secondary | ICD-10-CM | POA: Diagnosis not present

## 2016-04-23 LAB — LIPID PANEL
CHOL/HDL RATIO: 3.4 ratio (ref 0.0–5.0)
Cholesterol, Total: 147 mg/dL (ref 100–199)
HDL: 43 mg/dL (ref >39)
LDL Calculated: 78 mg/dL (ref 0–99)
TRIGLYCERIDES: 129 mg/dL (ref 0–149)
VLDL Cholesterol Cal: 26 mg/dL (ref 5–40)

## 2016-04-23 LAB — HEMOGLOBIN A1C
Est. average glucose Bld gHb Est-mCnc: 111 mg/dL
Hgb A1c MFr Bld: 5.5 % (ref 4.8–5.6)

## 2016-04-28 ENCOUNTER — Telehealth: Payer: Self-pay

## 2016-04-28 NOTE — Telephone Encounter (Signed)
Dr. Leonie Man reviewed the TCD test. The findings are low velocities on right carotid collateral from beyond proximal right ICA occulusion. Expecting finding nothing to worry about. Continue treatment plan.

## 2016-04-28 NOTE — Telephone Encounter (Signed)
Also per Dr. Leonie Man findings on the carotid doppler. The findings it shows chronic occlusion with some attempt at  no significant changes. No significant changes from previous carotid ultrasound in 10/2015.

## 2016-04-29 NOTE — Telephone Encounter (Deleted)
fff

## 2016-04-29 NOTE — Telephone Encounter (Signed)
Rn gave the patients wife the findings of the carotid doppler, and TCD findings. Rn gave the results dated 04/28/2016 on phone note. Pts wife verbalized understanding.

## 2016-04-30 ENCOUNTER — Telehealth: Payer: Self-pay

## 2016-04-30 NOTE — Telephone Encounter (Signed)
LM with results (per DPR).

## 2016-04-30 NOTE — Telephone Encounter (Signed)
-----   Message from Garvin Fila, MD sent at 04/30/2016  3:30 PM EDT ----- Ray Williams inform the patient had lipid profile and test for diabetes was satisfactory

## 2016-05-04 ENCOUNTER — Telehealth: Payer: Self-pay | Admitting: *Deleted

## 2016-05-04 NOTE — Telephone Encounter (Signed)
Spoke with patient and patient's wife. Per them, the dentist said he could not have any dental work until he is off the [employer].  Explained that there wasn't a contraindication with Harvoni and dental work and thought the dentist may be referring to his Plavix therapy.  Explained to patient and they understood and will call dentist office.   Cassie L. Donnajean Lopes, PharmD [facility] 05/04/2016, 11:58 AM

## 2016-05-04 NOTE — Telephone Encounter (Signed)
Wanting to know whether he can have dental work on treatment?  Dental work is scheduled for 05/13/16.  Dentist wondering whether dental work can happen while on treatment or whether it should wait.  Message sent to Pharmacist, Ray Williams for review.

## 2016-05-26 ENCOUNTER — Other Ambulatory Visit: Payer: Self-pay | Admitting: Neurology

## 2016-05-26 DIAGNOSIS — F411 Generalized anxiety disorder: Secondary | ICD-10-CM

## 2016-05-26 DIAGNOSIS — I63231 Cerebral infarction due to unspecified occlusion or stenosis of right carotid arteries: Secondary | ICD-10-CM

## 2016-05-27 ENCOUNTER — Other Ambulatory Visit: Payer: Self-pay

## 2016-05-27 DIAGNOSIS — F411 Generalized anxiety disorder: Secondary | ICD-10-CM

## 2016-05-27 DIAGNOSIS — I63231 Cerebral infarction due to unspecified occlusion or stenosis of right carotid arteries: Secondary | ICD-10-CM

## 2016-05-27 MED ORDER — CITALOPRAM HYDROBROMIDE 10 MG PO TABS: 10.0000 mg | ORAL_TABLET | Freq: Every day | ORAL | 0 refills | Status: DC

## 2016-06-01 IMAGING — CT CT ANGIO NECK
1 of 8 series · 1 of 33 positions shown · IV contrast (Iohexol (Omnipaque 350))
Comparison: MR and  MRA head 01/26/2015

CLINICAL DATA: Stroke

EXAM:
CT ANGIOGRAPHY NECK
TECHNIQUE: Multidetector CT imaging of the neck was performed using the
standard protocol during bolus administration of intravenous
contrast. Multiplanar CT image reconstructions and MIPs were
obtained to evaluate the vascular anatomy. Carotid stenosis
measurements (when applicable) are obtained utilizing NASCET
criteria, using the distal internal carotid diameter as the
denominator.
CONTRAST:  50mL OMNIPAQUE IOHEXOL 350 MG/ML SOLN

[Series 200: locator · axial · 0.38mm/px · 1 of 1 slices shown]
[im 1/1  soft-tissue]
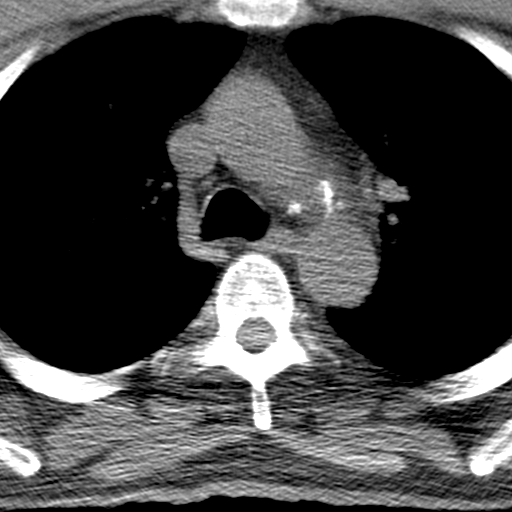

[1 of 33 positions shown; findings below may reference images not displayed]

FINDINGS: Aortic arch: Mild atherosclerotic disease in the aortic arch without
aneurysm or dissection. Proximal great vessels widely patent. Lung
apices are clear.

Right carotid system: Right common carotid artery shows diffuse
atherosclerotic thickening without flow limiting stenosis. Occlusion
of the right internal carotid artery at the origin. There is
irregular plaque in the area and this is likely an acute occlusion.
Severe stenosis right external carotid artery origin. The right
internal carotid artery reconstitutes at the skullbase via
collaterals.

Left carotid system: Atherosclerotic plaque throughout the left
common carotid artery with mild stenosis in the midportion and at
the origin. Atherosclerotic calcified plaque at the carotid
bifurcation without significant stenosis.

Vertebral arteries:Both vertebral arteries patent to the basilar
without significant stenosis or dissection. PICA patent bilaterally.

Skeleton: Negative

Other neck: Negative
IMPRESSION: Right internal carotid artery is occluded at the origin, probably
due to acute thrombus or plaque rupture. Right internal carotid
artery reconstitutes at the skullbase via collaterals. Severe
stenosis origin right external carotid artery.

Atherosclerotic plaque in the left carotid bifurcation without
significant left carotid stenosis. Both vertebral arteries are
patent to the basilar.

## 2016-06-08 ENCOUNTER — Other Ambulatory Visit: Payer: Self-pay | Admitting: Family

## 2016-06-08 ENCOUNTER — Other Ambulatory Visit: Payer: Self-pay | Admitting: Internal Medicine

## 2016-06-21 ENCOUNTER — Other Ambulatory Visit: Payer: Self-pay | Admitting: Family

## 2016-06-23 ENCOUNTER — Other Ambulatory Visit: Payer: BLUE CROSS/BLUE SHIELD

## 2016-06-23 DIAGNOSIS — B182 Chronic viral hepatitis C: Secondary | ICD-10-CM

## 2016-06-23 LAB — COMPLETE METABOLIC PANEL WITH GFR
ALT: 15 U/L (ref 9–46)
AST: 9 U/L — AB (ref 10–35)
Albumin: 4.1 g/dL (ref 3.6–5.1)
Alkaline Phosphatase: 74 U/L (ref 40–115)
BUN: 16 mg/dL (ref 7–25)
CALCIUM: 9.7 mg/dL (ref 8.6–10.3)
CHLORIDE: 101 mmol/L (ref 98–110)
CO2: 29 mmol/L (ref 20–31)
CREATININE: 1.21 mg/dL (ref 0.70–1.25)
GFR, Est African American: 73 mL/min (ref >=60)
GFR, Est Non African American: 63 mL/min (ref >=60)
Glucose, Bld: 122 mg/dL — ABNORMAL HIGH (ref 65–99)
Potassium: 4.9 mmol/L (ref 3.5–5.3)
Sodium: 137 mmol/L (ref 135–146)
TOTAL PROTEIN: 7 g/dL (ref 6.1–8.1)
Total Bilirubin: 0.5 mg/dL (ref 0.2–1.2)

## 2016-06-25 LAB — HEPATITIS C RNA QUANTITATIVE: HCV QUANT: NOT DETECTED [IU]/mL (ref <15)

## 2016-07-07 ENCOUNTER — Ambulatory Visit (INDEPENDENT_AMBULATORY_CARE_PROVIDER_SITE_OTHER): Payer: BLUE CROSS/BLUE SHIELD | Admitting: Internal Medicine

## 2016-07-07 ENCOUNTER — Encounter: Payer: Self-pay | Admitting: Internal Medicine

## 2016-07-07 VITALS — BP 143/68 | HR 67 | Temp 98.6°F | Wt 161.0 lb

## 2016-07-07 DIAGNOSIS — K7460 Unspecified cirrhosis of liver: Secondary | ICD-10-CM

## 2016-07-07 DIAGNOSIS — B182 Chronic viral hepatitis C: Secondary | ICD-10-CM

## 2016-07-07 NOTE — Assessment & Plan Note (Signed)
By elastography.  Will refer back to GI.

## 2016-07-07 NOTE — Progress Notes (Signed)
   Subjective:    Patient ID: Sier Ricklefs, male    DOB: [birth year], 65 y.o.   MRN: [id number]  HPI Here for follow up of HCV.   Has genotype 1b, viral load 1.2 million, elastography with F3/4, no cirrhosis on ultrasound.  Completed Harvoni.  Occasional alcohol but now going to quit.  Undetectable virus after treatment completion.  Has not been to GI.  Previously saw [facility]     Review of Systems  Constitutional: Negative for fatigue.  Gastrointestinal: Negative for diarrhea.  Skin: Negative for rash.       Objective:   Physical Exam  Constitutional: He appears well-developed and well-nourished. No distress.  Eyes: No scleral icterus.  Cardiovascular: Normal rate, regular rhythm and normal heart sounds.   No murmur heard. Skin: No rash noted.    Social History   Social History  . Marital status: Married    Spouse name: N/A  . Number of children: 2  . Years of education: 12   Occupational History  . Grading Contractor    Social History Main Topics  . Smoking status: Former Smoker    Quit date: 01/26/2015  . Smokeless tobacco: Never Used  . Alcohol use No     Comment: rarely  . Drug use: No  . Sexual activity: Not Currently   Other Topics Concern  . Not on file   Social History Narrative   Fun: Fish, collectable cars/classic cars   Denies religious beliefs effecting health care.        Assessment & Plan:

## 2016-07-07 NOTE — Assessment & Plan Note (Signed)
Completed.  Will need SVR 24 in 6 months to confirm cure.

## 2016-07-09 ENCOUNTER — Other Ambulatory Visit: Payer: Self-pay | Admitting: Family

## 2016-07-15 ENCOUNTER — Other Ambulatory Visit: Payer: Self-pay | Admitting: Family

## 2016-07-15 MED ORDER — CLOPIDOGREL BISULFATE 75 MG PO TABS: 75.0000 mg | ORAL_TABLET | Freq: Every day | ORAL | 0 refills | Status: DC

## 2016-07-15 NOTE — Telephone Encounter (Signed)
[facility]  Patient Name: Ray Williams  DOB: [birth year]    Initial Comment Caller states husband clopidogrel 75mg , he doesn't have one for today, and MD won't refill until seen. Wife wants to know will it hurt him.    Nurse Assessment  Nurse: Wayne Sever, RN, Tillie Rung Date/Time (Eastern Time): 07/15/2016 9:35:13 AM  Please select the assessment type ---Refill  Additional Documentation ---Wife states he needs a refill for his Clopidogrel 75mg . She states she forgot to make an appointment for his year follow up. She is concerned about him missing the dosage. She states he has appointment at 2pm tomorrow, but hates that he will miss one day.  Does the patient have enough medication to last until the office opens? ---No  Additional Documentation ---Wife is going to ask [facility] for loaner dosage for today until he gets there tomorrow. Vladimir [facility]aster, [phone] is the pharmacy. Advised her I would fax to office and make note. Caller has no symptoms. but really needs his blood thinner refilled.     Guidelines    Guideline Title Affirmed Question Affirmed Notes       Final Disposition User   Clinical Call [place], RN, Tillie Rung    Comments  Call has appointment tomorrow at 2pm, can his medication be refilled so he does not go a day without his blood thinner. Please send to [facility] at [place], [phone]. The medicaiton is Clopidogre

## 2016-07-15 NOTE — Telephone Encounter (Signed)
Notified pt wife can only send 2 tabs until appt tomorrow...Ray Williams

## 2016-07-16 ENCOUNTER — Encounter: Payer: Self-pay | Admitting: Family

## 2016-07-16 ENCOUNTER — Ambulatory Visit (INDEPENDENT_AMBULATORY_CARE_PROVIDER_SITE_OTHER): Payer: BLUE CROSS/BLUE SHIELD | Admitting: Family

## 2016-07-16 VITALS — BP 122/62 | HR 61 | Temp 97.6°F | Resp 16 | Ht 65.0 in | Wt 160.0 lb

## 2016-07-16 DIAGNOSIS — I6320 Cerebral infarction due to unspecified occlusion or stenosis of unspecified precerebral arteries: Secondary | ICD-10-CM

## 2016-07-16 DIAGNOSIS — F411 Generalized anxiety disorder: Secondary | ICD-10-CM

## 2016-07-16 DIAGNOSIS — I10 Essential (primary) hypertension: Secondary | ICD-10-CM

## 2016-07-16 MED ORDER — LISINOPRIL-HYDROCHLOROTHIAZIDE 20-25 MG PO TABS: 1.0000 | ORAL_TABLET | Freq: Every day | ORAL | 0 refills | Status: DC

## 2016-07-16 MED ORDER — ATORVASTATIN CALCIUM 40 MG PO TABS: ORAL_TABLET | ORAL | 0 refills | Status: DC

## 2016-07-16 MED ORDER — CITALOPRAM HYDROBROMIDE 10 MG PO TABS: 10.0000 mg | ORAL_TABLET | Freq: Every day | ORAL | 0 refills | Status: DC

## 2016-07-16 MED ORDER — CLOPIDOGREL BISULFATE 75 MG PO TABS: 75.0000 mg | ORAL_TABLET | Freq: Every day | ORAL | 0 refills | Status: DC

## 2016-07-16 NOTE — Progress Notes (Signed)
Subjective:    Patient ID: Ray Williams, male    DOB: September 04, 1950, 65 y.o.   MRN: [id number]  Chief Complaint  Patient presents with  . Follow-up    refill for plavix     HPI:  [name] is a 65 y.o. male who  has a past medical history of Hypertension; Kidney stones; Liver disease; and Stroke [facility]). and presents today for a follow up office visit.  1.) Hypertension - Currently maintained on hydrochlorothiazide.and lisinopril. Reports taking the medication as prescribed and denies adverse side effects. Blood pressures at home are below goal. Denies worst headache of life or symptoms of end organ damage. Continues to work on following a low sodium diet.  BP Readings from Last 3 Encounters:  07/16/16 122/62  07/07/16 (!) 143/68  04/07/16 (!) 117/56   2.) Anxiety/depression - Currently maintained on citalopram started by neurology and reports taking the medication as prescribed and denies adverse side effects. Symptoms are generally well controlled with the medication regimen.   3.) Stroke - Risk factors currently managed with medication and continues to be anticoagulated with clopidogrel. Reports taking the medication as prescribed and denies adverse side effects of nuisance bleeding.    No Known Allergies    Outpatient Medications Prior to Visit  Medication Sig Dispense Refill  . hydrochlorothiazide (HYDRODIURIL) 25 MG tablet TAKE ONE TABLET BY MOUTH ONCE DAILY 30 tablet 4  . lisinopril (PRINIVIL,ZESTRIL) 20 MG tablet TAKE ONE TABLET BY MOUTH ONCE DAILY IN THE EVENING 30 tablet 4  . atorvastatin (LIPITOR) 40 MG tablet TAKE ONE TABLET BY MOUTH ONCE DAILY AT  6PM 30 tablet 4  . citalopram (CELEXA) 10 MG tablet Take 1 tablet (10 mg total) by mouth daily. 90 tablet 0  . clopidogrel (PLAVIX) 75 MG tablet Take 1 tablet (75 mg total) by mouth daily. Must keep 07/16/16 appt for future refills 2 tablet 0   No facility-administered medications prior to visit.       No past  surgical history on file.    Past Medical History:  Diagnosis Date  . Hypertension   . Kidney stones   . Liver disease    hepatitis c  . Stroke [facility])      Review of Systems  Constitutional: Negative for chills and fever.  Eyes:       Negative for changes in vision  Respiratory: Negative for cough, chest tightness and wheezing.   Cardiovascular: Negative for chest pain, palpitations and leg swelling.  Neurological: Negative for dizziness, weakness and light-headedness.  Hematological: Does not bruise/bleed easily.  Psychiatric/Behavioral: Negative for agitation. The patient is not nervous/anxious.       Objective:    BP 122/62 (BP Location: Left Arm, Patient Position: Sitting, Cuff Size: Normal)   Pulse 61   Temp 97.6 F (36.4 C) (Oral)   Resp 16   Ht 5\' 5"  (1.651 m)   Wt 160 lb (72.6 kg)   SpO2 95%   BMI 26.63 kg/m  Nursing note and vital signs reviewed.  Physical Exam  Constitutional: He is oriented to person, place, and time. He appears well-developed and well-nourished. No distress.  Cardiovascular: Normal rate, regular rhythm, normal heart sounds and intact distal pulses.   Pulmonary/Chest: Effort normal and breath sounds normal.  Neurological: He is alert and oriented to person, place, and time.  Skin: Skin is warm and dry.  Psychiatric: He has a normal mood and affect. His behavior is normal. Judgment and thought content normal.  Assessment & Plan:   Problem List Items Addressed This Visit      Cardiovascular and Mediastinum   Stroke [facility])    Continue with risk factor management as described under hypertension. Continue current dosage of clopidogrel for anticoagulation pending neurology follow-up. Patient appears to be doing exceptionally well and reports feeling good. Continue to monitor.      Relevant Medications   lisinopril-hydrochlorothiazide (PRINZIDE,ZESTORETIC) 20-25 MG tablet   atorvastatin (LIPITOR) 40 MG tablet   clopidogrel (PLAVIX)  75 MG tablet   Essential hypertension - Primary    Hypertension adequately controlled current medication regimen and below goal 140/90. No adverse side effects, worse headache of life, or new symptoms of end organ damage noted on physical exam. Discontinue lisinopril and hydrochlorothiazide and start lisinopril-hydrochlorothiazide. Continue to monitor blood pressure at home and follow low-sodium diet.      Relevant Medications   lisinopril-hydrochlorothiazide (PRINZIDE,ZESTORETIC) 20-25 MG tablet   atorvastatin (LIPITOR) 40 MG tablet     Other   Generalized anxiety disorder    Anxiety appears adequately controlled with current medication regimen and no adverse effects. Continue current dosage of citalopram. Continue to monitor.      Relevant Medications   citalopram (CELEXA) 10 MG tablet       I am having Mr. Stanke start on lisinopril-hydrochlorothiazide. I am also having him maintain his hydrochlorothiazide, lisinopril, atorvastatin, citalopram, and clopidogrel.   Meds ordered this encounter  Medications  . lisinopril-hydrochlorothiazide (PRINZIDE,ZESTORETIC) 20-25 MG tablet    Sig: Take 1 tablet by mouth daily.    Dispense:  90 tablet    Refill:  0    Order Specific Question:   Supervising Provider    Answer:   Pricilla Holm A [id number]  . DISCONTD: clopidogrel (PLAVIX) 75 MG tablet    Sig: Take 1 tablet (75 mg total) by mouth daily.    Dispense:  90 tablet    Refill:  0    Order Specific Question:   Supervising Provider    Answer:   Pricilla Holm A [id number]  . DISCONTD: citalopram (CELEXA) 10 MG tablet    Sig: Take 1 tablet (10 mg total) by mouth daily.    Dispense:  90 tablet    Refill:  0    Order Specific Question:   Supervising Provider    Answer:   Pricilla Holm A [id number]  . DISCONTD: atorvastatin (LIPITOR) 40 MG tablet    Sig: TAKE ONE TABLET BY MOUTH ONCE DAILY AT  6PM    Dispense:  90 tablet    Refill:  0    Order Specific Question:   Supervising  Provider    Answer:   Pricilla Holm A [id number]  . atorvastatin (LIPITOR) 40 MG tablet    Sig: TAKE ONE TABLET BY MOUTH ONCE DAILY AT  6PM    Dispense:  90 tablet    Refill:  0    Order Specific Question:   Supervising Provider    Answer:   Pricilla Holm A [id number]  . citalopram (CELEXA) 10 MG tablet    Sig: Take 1 tablet (10 mg total) by mouth daily.    Dispense:  90 tablet    Refill:  0    Order Specific Question:   Supervising Provider    Answer:   Pricilla Holm A [id number]  . clopidogrel (PLAVIX) 75 MG tablet    Sig: Take 1 tablet (75 mg total) by mouth daily.    Dispense:  90 tablet    Refill:  0    Order Specific Question:   Supervising Provider    Answer:   Hoyt Koch [id number]     Follow-up: Return in about 6 months (around 01/14/2017), or if symptoms worsen or fail to improve.  Mauricio Po, FNP

## 2016-07-16 NOTE — Assessment & Plan Note (Signed)
Continue with risk factor management as described under hypertension. Continue current dosage of clopidogrel for anticoagulation pending neurology follow-up. Patient appears to be doing exceptionally well and reports feeling good. Continue to monitor.

## 2016-07-16 NOTE — Patient Instructions (Signed)
Thank you for choosing [facility].  SUMMARY AND INSTRUCTIONS:  Follow up for a physical at your convenience.  Medication:  Please continue to take your medications.   Your prescription(s) have been submitted to your pharmacy or been printed and provided for you. Please take as directed and contact our office if you believe you are having problem(s) with the medication(s) or have any questions.  Follow up:  If your symptoms worsen or fail to improve, please contact our office for further instruction, or in case of emergency go directly to the emergency room at the closest medical facility.

## 2016-07-16 NOTE — Assessment & Plan Note (Signed)
Anxiety appears adequately controlled with current medication regimen and no adverse effects. Continue current dosage of citalopram. Continue to monitor.

## 2016-07-16 NOTE — Assessment & Plan Note (Signed)
Hypertension adequately controlled current medication regimen and below goal 140/90. No adverse side effects, worse headache of life, or new symptoms of end organ damage noted on physical exam. Discontinue lisinopril and hydrochlorothiazide and start lisinopril-hydrochlorothiazide. Continue to monitor blood pressure at home and follow low-sodium diet.

## 2016-07-21 ENCOUNTER — Ambulatory Visit: Payer: BLUE CROSS/BLUE SHIELD | Admitting: Gastroenterology

## 2016-07-21 ENCOUNTER — Other Ambulatory Visit: Payer: Self-pay | Admitting: General Practice

## 2016-07-21 DIAGNOSIS — I6320 Cerebral infarction due to unspecified occlusion or stenosis of unspecified precerebral arteries: Secondary | ICD-10-CM

## 2016-07-21 MED ORDER — CLOPIDOGREL BISULFATE 75 MG PO TABS: 75.0000 mg | ORAL_TABLET | Freq: Every day | ORAL | 0 refills | Status: DC

## 2016-07-21 MED ORDER — ATORVASTATIN CALCIUM 40 MG PO TABS
ORAL_TABLET | ORAL | 0 refills | Status: DC
Start: 2016-07-21 — End: 2016-07-23

## 2016-07-23 ENCOUNTER — Other Ambulatory Visit: Payer: Self-pay | Admitting: General Practice

## 2016-07-23 DIAGNOSIS — I6320 Cerebral infarction due to unspecified occlusion or stenosis of unspecified precerebral arteries: Secondary | ICD-10-CM

## 2016-07-23 DIAGNOSIS — F411 Generalized anxiety disorder: Secondary | ICD-10-CM

## 2016-07-23 MED ORDER — ATORVASTATIN CALCIUM 40 MG PO TABS
ORAL_TABLET | ORAL | 0 refills | Status: DC
Start: 2016-07-23 — End: 2016-08-31

## 2016-07-23 MED ORDER — CLOPIDOGREL BISULFATE 75 MG PO TABS: 75.0000 mg | ORAL_TABLET | Freq: Every day | ORAL | 0 refills | Status: DC

## 2016-07-23 MED ORDER — CITALOPRAM HYDROBROMIDE 10 MG PO TABS: 10.0000 mg | ORAL_TABLET | Freq: Every day | ORAL | 0 refills | Status: DC

## 2016-07-27 ENCOUNTER — Telehealth: Payer: Self-pay | Admitting: *Deleted

## 2016-07-27 NOTE — Telephone Encounter (Signed)
Rec'd call from [facility] service stating they received scripts for Atorvastatin, citalopram, and plavix. Needing to verify scripts. Inform Kim of all 3 meds. Updated correct pharmacy...Johny Chess

## 2016-08-31 ENCOUNTER — Ambulatory Visit (INDEPENDENT_AMBULATORY_CARE_PROVIDER_SITE_OTHER): Payer: BLUE CROSS/BLUE SHIELD | Admitting: Family

## 2016-08-31 ENCOUNTER — Other Ambulatory Visit (INDEPENDENT_AMBULATORY_CARE_PROVIDER_SITE_OTHER): Payer: BLUE CROSS/BLUE SHIELD

## 2016-08-31 ENCOUNTER — Encounter: Payer: Self-pay | Admitting: Family

## 2016-08-31 ENCOUNTER — Other Ambulatory Visit: Payer: Self-pay | Admitting: Family

## 2016-08-31 VITALS — BP 146/74 | HR 61 | Temp 97.6°F | Resp 16 | Ht 65.0 in | Wt 160.0 lb

## 2016-08-31 DIAGNOSIS — Z0000 Encounter for general adult medical examination without abnormal findings: Secondary | ICD-10-CM

## 2016-08-31 DIAGNOSIS — B182 Chronic viral hepatitis C: Secondary | ICD-10-CM

## 2016-08-31 DIAGNOSIS — E782 Mixed hyperlipidemia: Secondary | ICD-10-CM

## 2016-08-31 DIAGNOSIS — I6320 Cerebral infarction due to unspecified occlusion or stenosis of unspecified precerebral arteries: Secondary | ICD-10-CM | POA: Diagnosis not present

## 2016-08-31 DIAGNOSIS — I10 Essential (primary) hypertension: Secondary | ICD-10-CM

## 2016-08-31 HISTORY — DX: Encounter for general adult medical examination without abnormal findings: Z00.00

## 2016-08-31 LAB — COMPREHENSIVE METABOLIC PANEL
ALBUMIN: 4.3 g/dL (ref 3.5–5.2)
ALK PHOS: 63 U/L (ref 39–117)
ALT: 14 U/L (ref 0–53)
AST: 8 U/L (ref 0–37)
BUN: 22 mg/dL (ref 6–23)
CHLORIDE: 104 meq/L (ref 96–112)
CO2: 28 mEq/L (ref 19–32)
CREATININE: 1.16 mg/dL (ref 0.40–1.50)
Calcium: 9.2 mg/dL (ref 8.4–10.5)
GFR: 67.28 mL/min (ref >60.00)
Glucose, Bld: 108 mg/dL — ABNORMAL HIGH (ref 70–99)
Potassium: 4.1 mEq/L (ref 3.5–5.1)
SODIUM: 138 meq/L (ref 135–145)
TOTAL PROTEIN: 7.3 g/dL (ref 6.0–8.3)
Total Bilirubin: 0.5 mg/dL (ref 0.2–1.2)

## 2016-08-31 LAB — CBC
HCT: 39.8 % (ref 39.0–52.0)
Hemoglobin: 13.7 g/dL (ref 13.0–17.0)
MCHC: 34.4 g/dL (ref 30.0–36.0)
MCV: 88.8 fl (ref 78.0–100.0)
Platelets: 289 10*3/uL (ref 150.0–400.0)
RBC: 4.49 Mil/uL (ref 4.22–5.81)
RDW: 13.3 % (ref 11.5–15.5)
WBC: 9.3 10*3/uL (ref 4.0–10.5)

## 2016-08-31 LAB — PSA: PSA: 0.59 ng/mL (ref 0.10–4.00)

## 2016-08-31 LAB — LIPID PANEL
Cholesterol: 144 mg/dL (ref 0–200)
HDL: 36.4 mg/dL — ABNORMAL LOW (ref >39.00)
LDL Cholesterol: 84 mg/dL (ref 0–99)
NONHDL: 107.54
Total CHOL/HDL Ratio: 4
Triglycerides: 119 mg/dL (ref 0.0–149.0)
VLDL: 23.8 mg/dL (ref 0.0–40.0)

## 2016-08-31 MED ORDER — ATORVASTATIN CALCIUM 80 MG PO TABS: 80.0000 mg | ORAL_TABLET | Freq: Every day | ORAL | 0 refills | Status: DC

## 2016-08-31 NOTE — Progress Notes (Signed)
Subjective:    Patient ID: Ray Williams, male    DOB: February 12, 1951, 66 y.o.   MRN: [id number]  Chief Complaint  Patient presents with  . CPE    fasting    HPI:  Ray Williams is a 66 y.o. male who presents today for an annual wellness visit.   1) Health Maintenance -   Diet - Averaging about 2-3 meals per day consisting of a heart healthy diet; Caffeine intake of about 1-2 cups daily.   Exercise - No structured exercise but works on wood and cutting wood.    2) Preventative Exams / Immunizations:  Dental -- Up to date  Vision --  Up to date   Health Maintenance  Topic Date Due  . COLONOSCOPY  12/22/2016 (Originally 03/26/2002)  . ZOSTAVAX  08/10/2017 (Originally 03/26/2012)  . TETANUS/TDAP  06/10/2026  . INFLUENZA VACCINE  Completed  . Hepatitis C Screening  Completed  . HIV Screening  Completed     Immunization History  Administered Date(s) Administered  . Hepatitis A, Adult 03/31/2016  . Pneumococcal Polysaccharide-23 03/31/2016     No Known Allergies   Outpatient Medications Prior to Visit  Medication Sig Dispense Refill  . atorvastatin (LIPITOR) 40 MG tablet TAKE ONE TABLET BY MOUTH ONCE DAILY AT  6PM 90 tablet 0  . citalopram (CELEXA) 10 MG tablet Take 1 tablet (10 mg total) by mouth daily. 90 tablet 0  . clopidogrel (PLAVIX) 75 MG tablet Take 1 tablet (75 mg total) by mouth daily. 90 tablet 0  . lisinopril-hydrochlorothiazide (PRINZIDE,ZESTORETIC) 20-25 MG tablet Take 1 tablet by mouth daily. 90 tablet 0  . hydrochlorothiazide (HYDRODIURIL) 25 MG tablet TAKE ONE TABLET BY MOUTH ONCE DAILY 30 tablet 4  . lisinopril (PRINIVIL,ZESTRIL) 20 MG tablet TAKE ONE TABLET BY MOUTH ONCE DAILY IN THE EVENING 30 tablet 4   No facility-administered medications prior to visit.      Past Medical History:  Diagnosis Date  . Hypertension   . Kidney stones   . Liver disease    hepatitis c  . Stroke [facility])      History reviewed. No pertinent surgical  history.   Family History  Problem Relation Age of Onset  . Heart disease Mother   . Heart attack Mother   . Healthy Father   . Healthy Maternal Grandmother   . Healthy Maternal Grandfather   . Healthy Paternal Grandmother   . Healthy Paternal Grandfather      Social History   Social History  . Marital status: Married    Spouse name: N/A  . Number of children: 2  . Years of education: 12   Occupational History  . Grading Contractor    Social History Main Topics  . Smoking status: Former Smoker    Quit date: 01/26/2015  . Smokeless tobacco: Never Used  . Alcohol use No  . Drug use: No  . Sexual activity: Not Currently   Other Topics Concern  . Not on file   Social History Narrative   Fun: Fish, collectable cars/classic cars   Denies religious beliefs effecting health care.     Review of Systems  Constitutional: Denies fever, chills, fatigue, or significant weight gain/loss. HENT: Head: Denies headache or neck pain Ears: Denies changes in hearing, ringing in ears, earache, drainage Nose: Denies discharge, stuffiness, itching, nosebleed, sinus pain Throat: Denies sore throat, hoarseness, dry mouth, sores, thrush Eyes: Denies loss/changes in vision, pain, redness, blurry/double vision, flashing lights Cardiovascular: Denies chest pain/discomfort, tightness, palpitations,  shortness of breath with activity, difficulty lying down, swelling, sudden awakening with shortness of breath Respiratory: Denies shortness of breath, cough, sputum production, wheezing Gastrointestinal: Denies dysphasia, heartburn, change in appetite, nausea, change in bowel habits, rectal bleeding, constipation, diarrhea, yellow skin or eyes Genitourinary: Denies frequency, urgency, burning/pain, blood in urine, incontinence, change in urinary strength. Musculoskeletal: Denies muscle/joint pain, stiffness, back pain, redness or swelling of joints, trauma Skin: Denies rashes, lumps, itching,  dryness, color changes, or hair/nail changes Neurological: Denies dizziness, fainting, seizures, weakness, numbness, tingling, tremor Psychiatric - Denies nervousness, stress, depression or memory loss Endocrine: Denies heat or cold intolerance, sweating, frequent urination, excessive thirst, changes in appetite Hematologic: Denies ease of bruising or bleeding     Objective:     BP (!) 146/74 (BP Location: Left Arm, Patient Position: Sitting, Cuff Size: Normal)   Pulse 61   Temp 97.6 F (36.4 C) (Oral)   Resp 16   Ht 5\' 5"  (1.651 m)   Wt 160 lb (72.6 kg)   SpO2 96%   BMI 26.63 kg/m  Nursing note and vital signs reviewed.   Physical Exam  Constitutional: He is oriented to person, place, and time. He appears well-developed and well-nourished.  HENT:  Head: Normocephalic.  Right Ear: Hearing, tympanic membrane, external ear and ear canal normal.  Left Ear: Hearing, tympanic membrane, external ear and ear canal normal.  Nose: Nose normal.  Mouth/Throat: Uvula is midline, oropharynx is clear and moist and mucous membranes are normal.  Eyes: Conjunctivae and EOM are normal. Pupils are equal, round, and reactive to light.  Neck: Neck supple. No JVD present. No tracheal deviation present. No thyromegaly present.  Cardiovascular: Normal rate, regular rhythm, normal heart sounds and intact distal pulses.   Pulmonary/Chest: Effort normal and breath sounds normal.  Abdominal: Soft. Bowel sounds are normal. He exhibits no distension and no mass. There is no tenderness. There is no rebound and no guarding.  Musculoskeletal: Normal range of motion. He exhibits no edema or tenderness.  Lymphadenopathy:    He has no cervical adenopathy.  Neurological: He is alert and oriented to person, place, and time. He has normal reflexes. No cranial nerve deficit. He exhibits normal muscle tone. Coordination normal.  Skin: Skin is warm and dry.  Psychiatric: He has a normal mood and affect. His behavior  is normal. Judgment and thought content normal.       Assessment & Plan:   Problem List Items Addressed This Visit      Cardiovascular and Mediastinum   Stroke ([place])    Appears stable and managed and monitored through risk factor adjustment including hypertension and hyperlipidemia. Currently anticoagulated with clopidogrel. Recommend increasing physical activity to 30 minutes of moderate level activity daily. Chronic conditions are fairly controlled with current medication regimen. Continue to monitor.      Essential hypertension - Primary    Blood pressure slightly elevated today above goal 140/90 with current regimen and no adverse side effects. Denies worst headache of life with no symptoms of end organ damage noted on physical exam. Encouraged to continue to monitor blood pressure at home and if average remains above 140/90 consider additional medication. Recommend increasing physical activity and following low-sodium diet. Continue to monitor.        Digestive   Chronic hepatitis C without hepatic coma ([place])    Stable and completed treatment. Continue follow-up and management per infectious disease.        Other   HLD (hyperlipidemia)  Currently maintained on atorvastatin with no adverse side effects or myalgias. Previous lipid profile slightly above LDL goal less than 70. Continue current dosage of atorvastatin pending lipid profile results.      Routine general medical examination at a health care facility    1) Anticipatory Guidance: Discussed importance of wearing a seatbelt while driving and not texting while driving; changing batteries in smoke detector at least once annually; wearing suntan lotion when outside; eating a balanced and moderate diet; getting physical activity at least 30 minutes per day.  2) Immunizations / Screenings / Labs:  Declines Zostavax. All other immunizations are up-to-date per recommendations. Obtain PSA for prostate cancer screening.  Currently working on colon cancer screening with the assistance of gastroenterology and infectious disease. All other screenings are up-to-date per recommendations. Obtain CBC, CMET, and lipid profile.    Overall well exam with risk factors for cardiovascular disease include previous stroke, hyperlipidemia, and hypertension. Chronic conditions appear fairly well controlled with current medication regimen and no adverse side effects. Goal is to reduce risk of stroke in the future. His hobby at this time is woodworking but she enjoys tremendously. Recommend increasing other physical activity to 30 minutes of moderate level activity daily. Continue other healthy lifestyle behaviors and choices. Follow-up prevention exam in 1 year. Follow-up office visit for chronic conditions pending blood work.       Relevant Orders   CBC   Comprehensive metabolic panel   PSA   Lipid panel       I have discontinued Mr. Maravilla's hydrochlorothiazide and lisinopril. I am also having him maintain his lisinopril-hydrochlorothiazide, clopidogrel, atorvastatin, and citalopram.   Follow-up: Return if symptoms worsen or fail to improve.   Mauricio Po, FNP

## 2016-08-31 NOTE — Assessment & Plan Note (Signed)
Appears stable and managed and monitored through risk factor adjustment including hypertension and hyperlipidemia. Currently anticoagulated with clopidogrel. Recommend increasing physical activity to 30 minutes of moderate level activity daily. Chronic conditions are fairly controlled with current medication regimen. Continue to monitor.

## 2016-08-31 NOTE — Assessment & Plan Note (Signed)
Blood pressure slightly elevated today above goal 140/90 with current regimen and no adverse side effects. Denies worst headache of life with no symptoms of end organ damage noted on physical exam. Encouraged to continue to monitor blood pressure at home and if average remains above 140/90 consider additional medication. Recommend increasing physical activity and following low-sodium diet. Continue to monitor.

## 2016-08-31 NOTE — Patient Instructions (Signed)
Thank you for choosing [facility].  SUMMARY AND INSTRUCTIONS:  Labs:  Please stop by the lab on the lower level of the building for your blood work. Your results will be released to [place] (or called to you) after review, usually within 72 hours after test completion. If any changes need to be made, you will be notified at that same time.  1.) The lab is open from 7:30am to 5:30 pm Monday-Friday 2.) No appointment is necessary 3.) Fasting (if needed) is 6-8 hours after food and drink; black coffee and water are okay   Follow up:  If your symptoms worsen or fail to improve, please contact our office for further instruction, or in case of emergency go directly to the emergency room at the closest medical facility.   Health Maintenance, Male A healthy lifestyle and preventative care can promote health and wellness.  Maintain regular health, dental, and eye exams.  Eat a healthy diet. Foods like vegetables, fruits, whole grains, low-fat dairy products, and lean protein foods contain the nutrients you need and are low in calories. Decrease your intake of foods high in solid fats, added sugars, and salt. Get information about a proper diet from your health care provider, if necessary.  Regular physical exercise is one of the most important things you can do for your health. Most adults should get at least 150 minutes of moderate-intensity exercise (any activity that increases your heart rate and causes you to sweat) each week. In addition, most adults need muscle-strengthening exercises on 2 or more days a week.   Maintain a healthy weight. The body mass index (BMI) is a screening tool to identify possible weight problems. It provides an estimate of body fat based on height and weight. Your health care provider can find your BMI and can help you achieve or maintain a healthy weight. For males 20 years and older:  A BMI below 18.5 is considered underweight.  A BMI of 18.5 to 24.9 is  normal.  A BMI of 25 to 29.9 is considered overweight.  A BMI of 30 and above is considered obese.  Maintain normal blood lipids and cholesterol by exercising and minimizing your intake of saturated fat. Eat a balanced diet with plenty of fruits and vegetables. Blood tests for lipids and cholesterol should begin at age 25 and be repeated every 5 years. If your lipid or cholesterol levels are high, you are over age 81, or you are at high risk for heart disease, you may need your cholesterol levels checked more frequently.Ongoing high lipid and cholesterol levels should be treated with medicines if diet and exercise are not working.  If you smoke, find out from your health care provider how to quit. If you do not use tobacco, do not start.  Lung cancer screening is recommended for adults aged 69-80 years who are at high risk for developing lung cancer because of a history of smoking. A yearly low-dose CT scan of the lungs is recommended for people who have at least a 30-pack-year history of smoking and are current smokers or have quit within the past 15 years. A pack year of smoking is smoking an average of 1 pack of cigarettes a day for 1 year (for example, a 30-pack-year history of smoking could mean smoking 1 pack a day for 30 years or 2 packs a day for 15 years). Yearly screening should continue until the smoker has stopped smoking for at least 15 years. Yearly screening should be stopped for  people who develop a health problem that would prevent them from having lung cancer treatment.  If you choose to drink alcohol, do not have more than 2 drinks per day. One drink is considered to be 12 oz (360 mL) of beer, 5 oz (150 mL) of wine, or 1.5 oz (45 mL) of liquor.  Avoid the use of street drugs. Do not share needles with anyone. Ask for help if you need support or instructions about stopping the use of drugs.  High blood pressure causes heart disease and increases the risk of stroke. High blood  pressure is more likely to develop in:  People who have blood pressure in the end of the normal range (100-139/85-89 mm Hg).  People who are overweight or obese.  People who are African American.  If you are 53-51 years of age, have your blood pressure checked every 3-5 years. If you are 56 years of age or older, have your blood pressure checked every year. You should have your blood pressure measured twice-once when you are at a hospital or clinic, and once when you are not at a hospital or clinic. Record the average of the two measurements. To check your blood pressure when you are not at a hospital or clinic, you can use:  An automated blood pressure machine at a pharmacy.  A home blood pressure monitor.  If you are 14-20 years old, ask your health care provider if you should take aspirin to prevent heart disease.  Diabetes screening involves taking a blood sample to check your fasting blood sugar level. This should be done once every 3 years after age 41 if you are at a normal weight and without risk factors for diabetes. Testing should be considered at a younger age or be carried out more frequently if you are overweight and have at least 1 risk factor for diabetes.  Colorectal cancer can be detected and often prevented. Most routine colorectal cancer screening begins at the age of 47 and continues through age 63. However, your health care provider may recommend screening at an earlier age if you have risk factors for colon cancer. On a yearly basis, your health care provider may provide home test kits to check for hidden blood in the stool. A small camera at the end of a tube may be used to directly examine the colon (sigmoidoscopy or colonoscopy) to detect the earliest forms of colorectal cancer. Talk to your health care provider about this at age 70 when routine screening begins. A direct exam of the colon should be repeated every 5-10 years through age 64, unless early forms of  precancerous polyps or small growths are found.  People who are at an increased risk for hepatitis B should be screened for this virus. You are considered at high risk for hepatitis B if:  You were born in a country where hepatitis B occurs often. Talk with your health care provider about which countries are considered high risk.  Your parents were born in a high-risk country and you have not received a shot to protect against hepatitis B (hepatitis B vaccine).  You have HIV or AIDS.  You use needles to inject street drugs.  You live with, or have sex with, someone who has hepatitis B.  You are a man who has sex with other men (MSM).  You get hemodialysis treatment.  You take certain medicines for conditions like cancer, organ transplantation, and autoimmune conditions.  Hepatitis C blood testing is recommended for all  people born from [birth year] through 1964 and any individual with known risk factors for hepatitis C.  Healthy men should no longer receive prostate-specific antigen (PSA) blood tests as part of routine cancer screening. Talk to your health care provider about prostate cancer screening.  Testicular cancer screening is not recommended for adolescents or adult males who have no symptoms. Screening includes self-exam, a health care provider exam, and other screening tests. Consult with your health care provider about any symptoms you have or any concerns you have about testicular cancer.  Practice safe sex. Use condoms and avoid high-risk sexual practices to reduce the spread of sexually transmitted infections (STIs).  You should be screened for STIs, including gonorrhea and chlamydia if:  You are sexually active and are younger than 24 years.  You are older than 24 years, and your health care provider tells you that you are at risk for this type of infection.  Your sexual activity has changed since you were last screened, and you are at an increased risk for chlamydia or  gonorrhea. Ask your health care provider if you are at risk.  If you are at risk of being infected with HIV, it is recommended that you take a prescription medicine daily to prevent HIV infection. This is called pre-exposure prophylaxis (PrEP). You are considered at risk if:  You are a man who has sex with other men (MSM).  You are a heterosexual man who is sexually active with multiple partners.  You take drugs by injection.  You are sexually active with a partner who has HIV.  Talk with your health care provider about whether you are at high risk of being infected with HIV. If you choose to begin PrEP, you should first be tested for HIV. You should then be tested every 3 months for as long as you are taking PrEP.  Use sunscreen. Apply sunscreen liberally and repeatedly throughout the day. You should seek shade when your shadow is shorter than you. Protect yourself by wearing long sleeves, pants, a wide-brimmed hat, and sunglasses year round whenever you are outdoors.  Tell your health care provider of new moles or changes in moles, especially if there is a change in shape or color. Also, tell your health care provider if a mole is larger than the size of a pencil eraser.  A one-time screening for abdominal aortic aneurysm (AAA) and surgical repair of large AAAs by ultrasound is recommended for men aged 73-75 years who are current or former smokers.  Stay current with your vaccines (immunizations). This information is not intended to replace advice given to you by your health care provider. Make sure you discuss any questions you have with your health care provider. Document Released: 01/23/2008 Document Revised: 08/17/2014 Document Reviewed: 04/30/2015 Elsevier Interactive Patient Education  2017 [employer].

## 2016-08-31 NOTE — Assessment & Plan Note (Signed)
1) Anticipatory Guidance: Discussed importance of wearing a seatbelt while driving and not texting while driving; changing batteries in smoke detector at least once annually; wearing suntan lotion when outside; eating a balanced and moderate diet; getting physical activity at least 30 minutes per day.  2) Immunizations / Screenings / Labs:  Declines Zostavax. All other immunizations are up-to-date per recommendations. Obtain PSA for prostate cancer screening. Currently working on colon cancer screening with the assistance of gastroenterology and infectious disease. All other screenings are up-to-date per recommendations. Obtain CBC, CMET, and lipid profile.    Overall well exam with risk factors for cardiovascular disease include previous stroke, hyperlipidemia, and hypertension. Chronic conditions appear fairly well controlled with current medication regimen and no adverse side effects. Goal is to reduce risk of stroke in the future. His hobby at this time is woodworking but she enjoys tremendously. Recommend increasing other physical activity to 30 minutes of moderate level activity daily. Continue other healthy lifestyle behaviors and choices. Follow-up prevention exam in 1 year. Follow-up office visit for chronic conditions pending blood work.

## 2016-08-31 NOTE — Assessment & Plan Note (Signed)
Currently maintained on atorvastatin with no adverse side effects or myalgias. Previous lipid profile slightly above LDL goal less than 70. Continue current dosage of atorvastatin pending lipid profile results.

## 2016-08-31 NOTE — Assessment & Plan Note (Signed)
Stable and completed treatment. Continue follow-up and management per infectious disease.

## 2016-09-30 ENCOUNTER — Ambulatory Visit: Payer: BLUE CROSS/BLUE SHIELD | Admitting: Gastroenterology

## 2016-10-07 ENCOUNTER — Ambulatory Visit: Payer: BLUE CROSS/BLUE SHIELD | Admitting: Nurse Practitioner

## 2016-10-08 ENCOUNTER — Encounter: Payer: Self-pay | Admitting: Nurse Practitioner

## 2016-11-03 ENCOUNTER — Other Ambulatory Visit: Payer: Self-pay | Admitting: *Deleted

## 2016-11-03 DIAGNOSIS — I6320 Cerebral infarction due to unspecified occlusion or stenosis of unspecified precerebral arteries: Secondary | ICD-10-CM

## 2016-11-03 MED ORDER — CLOPIDOGREL BISULFATE 75 MG PO TABS: 75.0000 mg | ORAL_TABLET | Freq: Every day | ORAL | 2 refills | Status: DC

## 2016-11-03 MED ORDER — ATORVASTATIN CALCIUM 80 MG PO TABS: 80.0000 mg | ORAL_TABLET | Freq: Every day | ORAL | 2 refills | Status: DC

## 2016-11-04 ENCOUNTER — Other Ambulatory Visit: Payer: Self-pay

## 2016-11-04 DIAGNOSIS — F411 Generalized anxiety disorder: Secondary | ICD-10-CM

## 2016-11-04 MED ORDER — CITALOPRAM HYDROBROMIDE 10 MG PO TABS: 10.0000 mg | ORAL_TABLET | Freq: Every day | ORAL | 1 refills | Status: DC

## 2016-11-09 ENCOUNTER — Other Ambulatory Visit: Payer: Self-pay | Admitting: *Deleted

## 2016-11-09 DIAGNOSIS — I6320 Cerebral infarction due to unspecified occlusion or stenosis of unspecified precerebral arteries: Secondary | ICD-10-CM

## 2016-11-09 MED ORDER — ATORVASTATIN CALCIUM 80 MG PO TABS
80.0000 mg | ORAL_TABLET | Freq: Every day | ORAL | 2 refills | Status: DC
Start: 2016-11-09 — End: 2017-05-11

## 2016-11-09 MED ORDER — CLOPIDOGREL BISULFATE 75 MG PO TABS
75.0000 mg | ORAL_TABLET | Freq: Every day | ORAL | 2 refills | Status: DC
Start: 2016-11-09 — End: 2017-05-11

## 2016-11-12 ENCOUNTER — Other Ambulatory Visit: Payer: Self-pay | Admitting: *Deleted

## 2016-11-12 ENCOUNTER — Ambulatory Visit (INDEPENDENT_AMBULATORY_CARE_PROVIDER_SITE_OTHER): Payer: BLUE CROSS/BLUE SHIELD | Admitting: Nurse Practitioner

## 2016-11-12 ENCOUNTER — Other Ambulatory Visit: Payer: Self-pay

## 2016-11-12 ENCOUNTER — Encounter: Payer: Self-pay | Admitting: Nurse Practitioner

## 2016-11-12 VITALS — BP 160/80 | HR 89 | Wt 164.6 lb

## 2016-11-12 DIAGNOSIS — I10 Essential (primary) hypertension: Secondary | ICD-10-CM

## 2016-11-12 DIAGNOSIS — I63511 Cerebral infarction due to unspecified occlusion or stenosis of right middle cerebral artery: Secondary | ICD-10-CM

## 2016-11-12 DIAGNOSIS — I6320 Cerebral infarction due to unspecified occlusion or stenosis of unspecified precerebral arteries: Secondary | ICD-10-CM

## 2016-11-12 MED ORDER — LISINOPRIL-HYDROCHLOROTHIAZIDE 20-25 MG PO TABS: 1.0000 | ORAL_TABLET | Freq: Every day | ORAL | 0 refills | Status: DC

## 2016-11-12 NOTE — Progress Notes (Signed)
I agree with the assessment and plan as directed by NP .The patient is not known to me .   Natori Gudino, MD  

## 2016-11-12 NOTE — Progress Notes (Signed)
[facility]  PATIENT: Ray Williams DOB: [birth year]   REASON FOR VISIT: Follow-up for history of stroke HISTORY FROM: Patient   HISTORY OF PRESENT ILLNESS:Ray Williams is a 70 year Caucasian male who seen today for first office follow-up visit following admission to [facility] on 01/26/15 for stroke. Ray Williams is an 66 y.o. male with a history of hypertension transferred to [facility] from [facility] for evaluation of speech difficulties, headache (resolved), AMS, and balance issues. Most of the history was obtained from the patient's family including his wife, daughter, and son-in-law. Apparently the patient had been having difficulties with severe hypertension recently. He was seen at the urgent care center and by his physician and prescribed Lotensin as well as hydrochlorothiazide.  He had to have a dental extraction earlier the week prior to admission however the procedure was postponed secondary to elevated blood pressures. He returned on Thursday of that week and had 3 teeth pulled and was placed on amoxicillin, ibuprofen, and hydrocodone. On Friday he did not feel well; however, the family attributed this to having had his teeth pulled. When he went to bed  night  Prior the essentially seemed normal. The morning of admission he awoke at 7 AM with a severe headache. It was located over his right eye. He described it as dull and a 7 on a 1-10 scale. It lasted approximately one hour. He took several ibuprofen without relief. He denies taking any hydrocodone today. The patient's daughter reports that he had cluster headaches 3-4 years ago although these resolved. Today at approximately 10:30 the patient started to act strange, lethargic ,and was felt to be confused. He also had some balance problems and fell into a cabinet and bruised his forehead. He was brought to the emergency department for further evaluation. A CT scan was performed that was  consistent with an acute infarct. An MRI/MRA is pending. The emergency room department nurse examined him prior to his MRI and gave him an NIH score of 1.Date last known well: Date: 01/25/2015 Time last known well: Unable to determine tPA Given: No: Late presentation. Minimal deficits On exam he was found to have partial left homonymous hemianopsia and mild diminished fine motor skills in the left hand and mild dragging of the left leg only. MRI scan of the brain showed an acute infarct in the right middle cerebral artery territory involving posterior left insular and right parietal cortex. MRA showed occlusion of the proximal right internal carotid artery which was confirmed subsequently on CT angiogram. Transthoracic echo showed normal ejection fraction without cardiac source of embolism. Carotid ultrasound confirmed proximal right ICA occlusion. LDL cholesterol was elevated at 171 and hemoglobin A1c was normal at 5.8. Patient was started on aspirin and Plavix and did well. He states his speech has recovered completely. He is has noted improvement in his peripheral vision as well. He has no subjective weakness. He does complain of decreased stamina and energy and in fact his lost about 20 pounds and is concerned. His primary care physician is made and appointment for him to see a gastroenterologist on September 12. He also had rectal pain as well as bleeding. 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 Update 10/07/15 : He returns for follow-up after last visit 6 months ago. He is accompanied by his daughter and wife. Patient continues to have issues with anxiety and does not socialize a lot.  He procrastinates a lot about things that he can no longer do. 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 Patient feels frustrated and anxious about not having anything to  do. He has quit smoking and drinking completely. He states his blood pressure is quite good and today it is 132/69 in office. He remains on Plavix which is tolerating well with only minor bruising but no bleeding. He is tolerating Lipitor well without muscle aches and pains. He has not had any recurrent stroke or TIA symptoms. He still has some minimum left-sided peripheral vision loss but has been able to drive a little bit and is very careful about it. Update 04/07/2016 ; He returns for follow-up after last visit 6 months ago. He continues to have left-sided peripheral vision defect which has not improved completely but is better. He is had no recurrent stroke or TIA symptoms. He had follow-up carotid ultrasound done on 3/9 for 17 which shows chronic right ICA occlusion and 50-69% left ICA stenosis. He states blood pressure is good and today it is 117/56. He has been driving carefully and has not had any accidents. He has been seeing Dr. Linus Salmons from infectious disease for hepatitis treatment. He feels the Celexa which was started at last visit seems to have helped his anxiety. He has no other new complaints today. UPDATE 04/05/2018CM  Ray Williams,67 year -old male  Returns for follow-up today with history of stroke event  In June 2016.He is currently on Plavix for secondary stroke prevention without further stroke or TIA symptoms.  He has minimal bruising and .no bleeding.  He is also on Lipitor 80 mg for hyperlipidemia without complaints of myalgias. He continues to have some mild left-sided peripheral vision deficit but is driving without difficulty .  Blood pressure mildly elevated today,  He has had problems getting RX from mail order.He claims he is back to baseline he returns for reevaluation.   REVIEW OF SYSTEMS: Full 14 system review of systems performed and notable only for those listed, all others are neg:  Constitutional: neg  Cardiovascular: neg Ear/Nose/Throat: neg  Skin: neg Eyes:  neg Respiratory: neg Gastroitestinal: neg  Hematology/Lymphatic: neg  Endocrine: neg Musculoskeletal:neg Allergy/Immunology: neg Neurological: neg Psychiatric: neg Sleep : neg   ALLERGIES: No Known Allergies  HOME MEDICATIONS: Outpatient Medications Prior to Visit  Medication Sig Dispense Refill  . atorvastatin (LIPITOR) 80 MG tablet Take 1 tablet (80 mg total) by mouth daily. 90 tablet 2  . citalopram (CELEXA) 10 MG tablet Take 1 tablet (10 mg total) by mouth daily. 90 tablet 1  . clopidogrel (PLAVIX) 75 MG tablet Take 1 tablet (75 mg total) by mouth daily. 90 tablet 2  . lisinopril-hydrochlorothiazide (PRINZIDE,ZESTORETIC) 20-25 MG tablet Take 1 tablet by mouth daily. 90 tablet 0   No facility-administered medications prior to visit.     PAST MEDICAL HISTORY: Past Medical History:  Diagnosis Date  . Hypertension   . Kidney stones   . Liver disease    hepatitis c  . Stroke [facility])     PAST SURGICAL HISTORY: History reviewed. No pertinent surgical history.  FAMILY HISTORY: Family History  Problem Relation Age of Onset  . Heart disease Mother   . Heart attack Mother   . Healthy Father   . Healthy Maternal Grandmother   . Healthy Maternal Grandfather   . Healthy Paternal Grandmother   . Healthy Paternal Grandfather  SOCIAL HISTORY: Social History   Social History  . Marital status: Married    Spouse name: N/A  . Number of children: 2  . Years of education: 12   Occupational History  . Grading Contractor    Social History Main Topics  . Smoking status: Former Smoker    Quit date: 01/26/2015  . Smokeless tobacco: Never Used  . Alcohol use No  . Drug use: No  . Sexual activity: Not Currently   Other Topics Concern  . Not on file   Social History Narrative   Fun: Fish, collectable cars/classic cars   Denies religious beliefs effecting health care.      PHYSICAL EXAM  Vitals:   11/12/16 1043  BP: 160/80  Pulse: 89  Weight: 164 lb 9.6 oz  (74.7 kg)   Body mass index is 27.39 kg/m.  Generalized: Well developed, in no acute distress  Head: normocephalic and atraumatic,. Oropharynx benign  Neck: Supple,soft left bruit  Cardiac: Regular rate rhythm, no murmur  Musculoskeletal: No deformity   Neurological examination   Mentation: Alert oriented to time, place, history taking. Attention span and concentration appropriate. Recent and remote memory intact.  Follows all commands speech and language fluent.   Cranial nerve II-XII: Pupils were equal round reactive to light extraocular movements were full, visual field show partial left homonymous hemianopsia to  on confrontational test. Facial sensation and strength were normal. hearing was intact to finger rubbing bilaterally. Uvula tongue midline. head turning and shoulder shrug were normal and symmetric.Tongue protrusion into cheek strength was normal. Motor: normal bulk and tone, full strength in the BUE, BLE, fine finger movements normal, no pronator drift. No focal weakness Sensory: normal and symmetric to light touch, pinprick, and  Vibration,  In the upper and lower extremities Coordination: finger-nose-finger, heel-to-shin bilaterally, no dysmetria Reflexes:  1+ upper lower and symmetric , plantar responses were flexor bilaterally. Gait and Station: Rising up from seated position without assistance, normal stance,  moderate stride, good arm swing, smooth turning, able to perform tiptoe, and heel walking without difficulty. Tandem gait is steady  DIAGNOSTIC DATA (LABS, IMAGING, TESTING) - I reviewed patient records, labs, notes, testing and imaging myself where available.  Lab Results  Component Value Date   WBC 9.3 08/31/2016   HGB 13.7 08/31/2016   HCT 39.8 08/31/2016   MCV 88.8 08/31/2016   PLT 289.0 08/31/2016      Component Value Date/Time   NA 138 08/31/2016 0919   K 4.1 08/31/2016 0919   CL 104 08/31/2016 0919   CO2 28 08/31/2016 0919   GLUCOSE 108 (H)  08/31/2016 0919   BUN 22 08/31/2016 0919   CREATININE 1.16 08/31/2016 0919   CREATININE 1.21 06/23/2016 1136   CALCIUM 9.2 08/31/2016 0919   PROT 7.3 08/31/2016 0919   ALBUMIN 4.3 08/31/2016 0919   AST 8 08/31/2016 0919   ALT 14 08/31/2016 0919   ALT 58 (H) 01/08/2016 1055   ALKPHOS 63 08/31/2016 0919   BILITOT 0.5 08/31/2016 0919   GFRNONAA 63 06/23/2016 1136   GFRAA 73 06/23/2016 1136   Lab Results  Component Value Date   CHOL 144 08/31/2016   HDL 36.40 (L) 08/31/2016   LDLCALC 84 08/31/2016   LDLDIRECT 60 04/05/2015   TRIG 119.0 08/31/2016   CHOLHDL 4 08/31/2016   Lab Results  Component Value Date   HGBA1C 5.5 04/22/2016   No results found for: [id number] Lab Results  Component Value Date   TSH 0.91 06/21/2015  ASSESSMENT AND PLAN 26 year Caucasian male with right MCA branch infarct in June 2016 secondary to thromboembolism from proximal right ICA occlusion was doing quite well with very mild left-sided vision deficits. Vascular risk factors of hypertension, hyperlipidemia and large vessel atherosclerosis.  The patient is a current patient of Dr. Leonie Man  who is out of the office today . This note is sent to the work in doctor.      Keep systolic blood pressure less than 130, today's reading  160/80 Will refill B/P med for 1 month has been out a week  Lipids are followed by PCP continue Lipitor Continue Plavix for  secondary stroke prevention No further stroke or TIA symptoms since June 2016 Will   Need  Yearly carotid Doppler  Which can be arranged by primary care If recurrent stroke symptoms occur, call 911 and proceed to the hospital Discharge from neurologic services at this time REMEMBER Pneumonic FAST which stands for  F stands  for face drooping and weakness etc. A stands for arms, weakness S stands for speech slurred  T stands for time to call [place], [facility], [facility], [facility] [address] [place], [place] [address] [phone]

## 2016-11-12 NOTE — Patient Instructions (Signed)
Keep systolic blood pressure less than 130, today's reading  177/89 Will refill B/P med for 1 month has been out a week  Lipids are followed by PCP continue Lipitor Continue Plavix for  secondary stroke prevention No further stroke or TIA symptoms since June 2016 If recurrent stroke symptoms occur, call 911 and proceed to the hospital Discharge from neurologic services at this time REMEMBER Pneumonic FAST which stands for  F stands  for face drooping and weakness etc. A stands for arms, weakness S stands for speech slurred  T stands for time to call 911

## 2016-11-16 ENCOUNTER — Telehealth: Payer: Self-pay | Admitting: Nurse Practitioner

## 2016-11-16 DIAGNOSIS — I6521 Occlusion and stenosis of right carotid artery: Secondary | ICD-10-CM

## 2016-11-16 DIAGNOSIS — I63511 Cerebral infarction due to unspecified occlusion or stenosis of right middle cerebral artery: Secondary | ICD-10-CM

## 2016-11-16 DIAGNOSIS — I6320 Cerebral infarction due to unspecified occlusion or stenosis of unspecified precerebral arteries: Secondary | ICD-10-CM

## 2016-11-16 NOTE — Telephone Encounter (Signed)
Spoke with patient's wife regarding follow-up carotid Doppler. She would like to have this done at [facility] aware this needs to be followed yearly. They may decide to go to vascular surgery next year to be followed. In the meantime we will order this for [facility]

## 2016-11-17 ENCOUNTER — Telehealth: Payer: Self-pay | Admitting: Nurse Practitioner

## 2016-11-17 NOTE — Telephone Encounter (Signed)
Called and spoke to Patient's wife and Relayed apt.  His apt at [facility] for Doppler is Thursday april 12 th  Arrive at 9:30 am for 10:00 .   Oval Linsey telephone [phone] - fax [phone].

## 2016-11-24 ENCOUNTER — Telehealth: Payer: Self-pay | Admitting: Nurse Practitioner

## 2016-11-24 NOTE — Telephone Encounter (Signed)
Please let patient know carotid doppler stable since last year. Needs to be repeated yearly. Please talk to your PCP about referral to vascular surgery in your area  in the next year, they can follow your studies and intervene when surgery is indicated.

## 2016-11-24 NOTE — Telephone Encounter (Signed)
Per Daun Peacock, NP, spoke with wife Lanora Manis and informed her that his carotid doppler study is unchanged from last year. Advised her that Dr Erlinda Hong also reviewed results wit Daun Peacock. Advised that his PCP can refer him to a vascular surgeon to follow up on study yearly. Advised her this RN will fax results to his PCP, Toniann Ket FNP. She verbalized understanding, appreciation.

## 2016-11-26 NOTE — Telephone Encounter (Signed)
Made multiple attempts to fax doppler results to Toniann Ket, FNP. Fax failed. Attempted to call office; number not in working order. Mailed copy to patient with instructions to take to next appointment with PCP.

## 2016-12-14 ENCOUNTER — Other Ambulatory Visit: Payer: Self-pay | Admitting: Nurse Practitioner

## 2016-12-14 ENCOUNTER — Telehealth: Payer: Self-pay | Admitting: Family

## 2016-12-14 DIAGNOSIS — I10 Essential (primary) hypertension: Secondary | ICD-10-CM

## 2016-12-14 MED ORDER — LISINOPRIL-HYDROCHLOROTHIAZIDE 20-25 MG PO TABS: 1.0000 | ORAL_TABLET | Freq: Every day | ORAL | 0 refills | Status: DC

## 2016-12-14 NOTE — Telephone Encounter (Signed)
Pt calling not clear as to what is going on with his lisinopril-hydrochlorothiazide (PRINZIDE,ZESTORETIC) 20-25 MG tablet  Pt said that [place] had one before wrote a perscription  For the medication and advised him not to go without it. Pt would prefer to get medication from  [place], [place] [phone] (Phone)    Please call

## 2016-12-14 NOTE — Telephone Encounter (Addendum)
I spoke to [facility] at [facility], [phone].  I relayed that pt received refill 30 day (grace) 11-12-16 when in to see [place],  until could get in touch with pcp about getting refills on his Bp med (lisinopril-HCTZ 20-25 mg tab) one tablet daily.  I confirmed that pt is using [facility] in [place], [place].   She would relay message as per below to Terri Piedra, [place].  I relayed this to pt via VM on his home #.

## 2016-12-14 NOTE — Addendum Note (Signed)
Addended by: Mauricio Po D on: 12/14/2016 04:30 PM   Modules accepted: Orders

## 2016-12-14 NOTE — Telephone Encounter (Signed)
Error

## 2016-12-14 NOTE — Telephone Encounter (Signed)
New prescription sent to pharmacy 

## 2016-12-14 NOTE — Addendum Note (Signed)
Addended byOliver Hum on: 12/14/2016 04:19 PM   Modules accepted: Orders

## 2016-12-14 NOTE — Telephone Encounter (Signed)
Ray Williams, Can you please send refill for patient to pharmacy.  Thanks!

## 2016-12-16 ENCOUNTER — Other Ambulatory Visit: Payer: Self-pay | Admitting: *Deleted

## 2016-12-16 DIAGNOSIS — I10 Essential (primary) hypertension: Secondary | ICD-10-CM

## 2016-12-16 MED ORDER — LISINOPRIL-HYDROCHLOROTHIAZIDE 20-25 MG PO TABS: 1.0000 | ORAL_TABLET | Freq: Every day | ORAL | 1 refills | Status: DC

## 2017-01-05 ENCOUNTER — Ambulatory Visit: Payer: BLUE CROSS/BLUE SHIELD | Admitting: Internal Medicine

## 2017-04-22 DIAGNOSIS — Z0271 Encounter for disability determination: Secondary | ICD-10-CM

## 2017-05-05 ENCOUNTER — Telehealth: Payer: Self-pay | Admitting: Family Medicine

## 2017-05-05 NOTE — Telephone Encounter (Signed)
Pt spouse Ray Williams called states Ray Williams PCP is changing to a specialty disease practice and is no long able to see pt. Would Ray Williams consider taking Ray Williams as a new pt to be his PCP.

## 2017-05-05 NOTE — Telephone Encounter (Signed)
sure

## 2017-05-06 NOTE — Telephone Encounter (Signed)
Called pt lmg for ret call to schedule new pt appt per [place] prior approval. See previous note. °

## 2017-05-10 ENCOUNTER — Telehealth: Payer: Self-pay

## 2017-05-10 NOTE — Telephone Encounter (Signed)
Pre visit call completed 

## 2017-05-11 ENCOUNTER — Ambulatory Visit (INDEPENDENT_AMBULATORY_CARE_PROVIDER_SITE_OTHER): Payer: Medicare Other | Admitting: Family Medicine

## 2017-05-11 ENCOUNTER — Encounter: Payer: Self-pay | Admitting: Family Medicine

## 2017-05-11 VITALS — BP 117/80 | HR 64 | Temp 98.2°F | Ht 66.6 in | Wt 160.0 lb

## 2017-05-11 DIAGNOSIS — M25522 Pain in left elbow: Secondary | ICD-10-CM | POA: Diagnosis not present

## 2017-05-11 DIAGNOSIS — I10 Essential (primary) hypertension: Secondary | ICD-10-CM | POA: Diagnosis not present

## 2017-05-11 DIAGNOSIS — B182 Chronic viral hepatitis C: Secondary | ICD-10-CM

## 2017-05-11 DIAGNOSIS — Z0000 Encounter for general adult medical examination without abnormal findings: Secondary | ICD-10-CM

## 2017-05-11 DIAGNOSIS — Z720 Tobacco use: Secondary | ICD-10-CM

## 2017-05-11 DIAGNOSIS — M25511 Pain in right shoulder: Secondary | ICD-10-CM | POA: Diagnosis not present

## 2017-05-11 DIAGNOSIS — Z8619 Personal history of other infectious and parasitic diseases: Secondary | ICD-10-CM

## 2017-05-11 DIAGNOSIS — I63511 Cerebral infarction due to unspecified occlusion or stenosis of right middle cerebral artery: Secondary | ICD-10-CM | POA: Diagnosis not present

## 2017-05-11 DIAGNOSIS — F411 Generalized anxiety disorder: Secondary | ICD-10-CM

## 2017-05-11 DIAGNOSIS — F418 Other specified anxiety disorders: Secondary | ICD-10-CM

## 2017-05-11 DIAGNOSIS — I6320 Cerebral infarction due to unspecified occlusion or stenosis of unspecified precerebral arteries: Secondary | ICD-10-CM | POA: Diagnosis not present

## 2017-05-11 DIAGNOSIS — E782 Mixed hyperlipidemia: Secondary | ICD-10-CM

## 2017-05-11 DIAGNOSIS — R739 Hyperglycemia, unspecified: Secondary | ICD-10-CM | POA: Diagnosis not present

## 2017-05-11 HISTORY — DX: Pain in right shoulder: M25.511

## 2017-05-11 HISTORY — DX: Personal history of other infectious and parasitic diseases: Z86.19

## 2017-05-11 LAB — COMPREHENSIVE METABOLIC PANEL
ALT: 12 U/L (ref 0–53)
AST: 9 U/L (ref 0–37)
Albumin: 4.1 g/dL (ref 3.5–5.2)
Alkaline Phosphatase: 53 U/L (ref 39–117)
BUN: 20 mg/dL (ref 6–23)
CHLORIDE: 102 meq/L (ref 96–112)
CO2: 31 meq/L (ref 19–32)
CREATININE: 1.23 mg/dL (ref 0.40–1.50)
Calcium: 9.5 mg/dL (ref 8.4–10.5)
GFR: 62.74 mL/min (ref >60.00)
GLUCOSE: 103 mg/dL — AB (ref 70–99)
Potassium: 4.1 mEq/L (ref 3.5–5.1)
SODIUM: 137 meq/L (ref 135–145)
Total Bilirubin: 0.4 mg/dL (ref 0.2–1.2)
Total Protein: 6.7 g/dL (ref 6.0–8.3)

## 2017-05-11 LAB — LIPID PANEL
CHOL/HDL RATIO: 5
Cholesterol: 165 mg/dL (ref 0–200)
HDL: 34.8 mg/dL — ABNORMAL LOW (ref >39.00)
LDL CALC: 101 mg/dL — AB (ref 0–99)
NONHDL: 130.16
Triglycerides: 145 mg/dL (ref 0.0–149.0)
VLDL: 29 mg/dL (ref 0.0–40.0)

## 2017-05-11 LAB — CBC
HCT: 38.7 % — ABNORMAL LOW (ref 39.0–52.0)
Hemoglobin: 12.9 g/dL — ABNORMAL LOW (ref 13.0–17.0)
MCHC: 33.3 g/dL (ref 30.0–36.0)
MCV: 91.4 fl (ref 78.0–100.0)
Platelets: 265 10*3/uL (ref 150.0–400.0)
RBC: 4.24 Mil/uL (ref 4.22–5.81)
RDW: 13.9 % (ref 11.5–15.5)
WBC: 8.2 10*3/uL (ref 4.0–10.5)

## 2017-05-11 LAB — TSH: TSH: 1.47 u[IU]/mL (ref 0.35–4.50)

## 2017-05-11 LAB — HEMOGLOBIN A1C: Hgb A1c MFr Bld: 6.1 % (ref 4.6–6.5)

## 2017-05-11 MED ORDER — CITALOPRAM HYDROBROMIDE 10 MG PO TABS: 10.0000 mg | ORAL_TABLET | Freq: Every day | ORAL | 2 refills | Status: DC

## 2017-05-11 MED ORDER — LISINOPRIL-HYDROCHLOROTHIAZIDE 20-25 MG PO TABS: 1.0000 | ORAL_TABLET | Freq: Every day | ORAL | 1 refills | Status: DC

## 2017-05-11 MED ORDER — CLOPIDOGREL BISULFATE 75 MG PO TABS: 75.0000 mg | ORAL_TABLET | Freq: Every day | ORAL | 2 refills | Status: DC

## 2017-05-11 MED ORDER — ATORVASTATIN CALCIUM 80 MG PO TABS: 80.0000 mg | ORAL_TABLET | Freq: Every day | ORAL | 2 refills | Status: DC

## 2017-05-11 NOTE — Patient Instructions (Addendum)
Tylenol/Acetaminophen ES 500 mg, 1-2 tabs twice daily and Lidocaine topically as needed  Shingrix shot Preventive Care 40-64 Years, Male Preventive care refers to lifestyle choices and visits with your health care provider that can promote health and wellness. What does preventive care include?  A yearly physical exam. This is also called an annual well check.  Dental exams once or twice a year.  Routine eye exams. Ask your health care provider how often you should have your eyes checked.  Personal lifestyle choices, including: ? Daily care of your teeth and gums. ? Regular physical activity. ? Eating a healthy diet. ? Avoiding tobacco and drug use. ? Limiting alcohol use. ? Practicing safe sex. ? Taking low-dose aspirin every day starting at age 73. What happens during an annual well check? The services and screenings done by your health care provider during your annual well check will depend on your age, overall health, lifestyle risk factors, and family history of disease. Counseling Your health care provider may ask you questions about your:  Alcohol use.  Tobacco use.  Drug use.  Emotional well-being.  Home and relationship well-being.  Sexual activity.  Eating habits.  Work and work Statistician.  Screening You may have the following tests or measurements:  Height, weight, and BMI.  Blood pressure.  Lipid and cholesterol levels. These may be checked every 5 years, or more frequently if you are over 65 years old.  Skin check.  Lung cancer screening. You may have this screening every year starting at age 24 if you have a 30-pack-year history of smoking and currently smoke or have quit within the past 15 years.  Fecal occult blood test (FOBT) of the stool. You may have this test every year starting at age 21.  Flexible sigmoidoscopy or colonoscopy. You may have a sigmoidoscopy every 5 years or a colonoscopy every 10 years starting at age 60.  Prostate  cancer screening. Recommendations will vary depending on your family history and other risks.  Hepatitis C blood test.  Hepatitis B blood test.  Sexually transmitted disease (STD) testing.  Diabetes screening. This is done by checking your blood sugar (glucose) after you have not eaten for a while (fasting). You may have this done every 1-3 years.  Discuss your test results, treatment options, and if necessary, the need for more tests with your health care provider. Vaccines Your health care provider may recommend certain vaccines, such as:  Influenza vaccine. This is recommended every year.  Tetanus, diphtheria, and acellular pertussis (Tdap, Td) vaccine. You may need a Td booster every 10 years.  Varicella vaccine. You may need this if you have not been vaccinated.  Zoster vaccine. You may need this after age 48.  Measles, mumps, and rubella (MMR) vaccine. You may need at least one dose of MMR if you were born in 1957 or later. You may also need a second dose.  Pneumococcal 13-valent conjugate (PCV13) vaccine. You may need this if you have certain conditions and have not been vaccinated.  Pneumococcal polysaccharide (PPSV23) vaccine. You may need one or two doses if you smoke cigarettes or if you have certain conditions.  Meningococcal vaccine. You may need this if you have certain conditions.  Hepatitis A vaccine. You may need this if you have certain conditions or if you travel or work in places where you may be exposed to hepatitis A.  Hepatitis B vaccine. You may need this if you have certain conditions or if you travel or work in  places where you may be exposed to hepatitis B.  Haemophilus influenzae type b (Hib) vaccine. You may need this if you have certain risk factors.  Talk to your health care provider about which screenings and vaccines you need and how often you need them. This information is not intended to replace advice given to you by your health care provider.  Make sure you discuss any questions you have with your health care provider. Document Released: 08/23/2015 Document Revised: 04/15/2016 Document Reviewed: 05/28/2015 Elsevier Interactive Patient Education  2017 [employer].

## 2017-05-11 NOTE — Assessment & Plan Note (Addendum)
Encouraged complete cessation. Discussed need to quit as relates to risk of numerous cancers, cardiac and pulmonary disease as well as neurologic complications. Counseled for greater than 3 minutes. Smokes about a 1/2 ppd he has the 7 mg patch.

## 2017-05-11 NOTE — Assessment & Plan Note (Signed)
S/p a fall in February of 2018, will xray and refer to sports med. Encouraged topical lidocaine and Tylenol/Acetaminophen.

## 2017-05-11 NOTE — Assessment & Plan Note (Signed)
Well controlled, no changes to meds. Encouraged heart healthy diet such as the DASH diet and exercise as tolerated.  °

## 2017-05-11 NOTE — Assessment & Plan Note (Signed)
Encouraged to proceed with colonoscopy but he is worried about insurance payment. Proceed with Cologuard

## 2017-05-11 NOTE — Assessment & Plan Note (Signed)
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

## 2017-05-11 NOTE — Assessment & Plan Note (Signed)
Encouraged Shingrix shot

## 2017-05-11 NOTE — Assessment & Plan Note (Signed)
Right sided follows with Dr Leonie Man and [facility] Neurology annually event was roughly 2 years ago.

## 2017-05-11 NOTE — Assessment & Plan Note (Addendum)
encouraged heart healthy diet, avoid trans fats, minimize simple carbs and saturated fats. Increase exercise as tolerated 

## 2017-05-11 NOTE — Progress Notes (Signed)
Subjective:  I acted as a Education administrator for Dr. Rogue Jury, CMA   Patient ID: Ray Williams, male    DOB: March 27, 1951, 66 y.o.   MRN: [id number]  Chief Complaint  Patient presents with  . Establish Care    Was seeing Dr.Colone in the [facility]. Needs to establish with new provider  . Generalized Body Aches    States he's having pain in his right shoulder and left elbow.    HPI  Patient is in today to establish care. He has a past medical history that includes, hyperlipidemia, tobacco use, hyperglycemia, chronic hepatitis c and cerebrovascular disease. No fevers, chills or recent hospitalization. He suffered a fall last February onto his outstretched hands and has had left elbow pain and right shoulder pain since that time. He denies any fractures. Tries to Google a heart healthy diet and stay active. Denies CP/palp/SOB/HA/congestion/fevers/GI or GU c/o. Taking meds as prescribed  Patient Care Team: Mosie Lukes, MD as PCP - General (Family Medicine)   Past Medical History:  Diagnosis Date  . Anxiety   . Depression   . Depression with anxiety 01/08/2016  . H/O ischemic right MCA stroke   . History of chicken pox 05/11/2017  . Hyperlipidemia   . Hypertension   . Liver disease    hepatitis c  . Right shoulder pain 05/11/2017  . Stroke [facility])     No past surgical history on file.  Family History  Problem Relation Age of Onset  . Heart disease Mother        MI  . Heart attack Mother   . Asthma Mother   . Hyperlipidemia Mother   . Hypertension Mother   . Diabetes Father   . Healthy Maternal Grandmother   . Healthy Maternal Grandfather   . Healthy Paternal Grandmother   . Healthy Paternal Grandfather   . Hyperlipidemia Sister     Social History   Social History  . Marital status: Married    Spouse name: N/A  . Number of children: 2  . Years of education: 12   Occupational History  . Grading Contractor    Social History Main Topics  . Smoking status: Former Smoker     Quit date: 01/26/2015  . Smokeless tobacco: Never Used  . Alcohol use No  . Drug use: No  . Sexual activity: Not Currently   Other Topics Concern  . Not on file   Social History Narrative   Fun: Fish, collectable cars/classic cars   Denies religious beliefs effecting health care   Retired from Therapist, occupational   Lives with wife   No dietary restrictions, wears seat belt.     Outpatient Medications Prior to Visit  Medication Sig Dispense Refill  . atorvastatin (LIPITOR) 80 MG tablet Take 1 tablet (80 mg total) by mouth daily. 90 tablet 2  . citalopram (CELEXA) 10 MG tablet Take 1 tablet (10 mg total) by mouth daily. 90 tablet 1  . clopidogrel (PLAVIX) 75 MG tablet Take 1 tablet (75 mg total) by mouth daily. 90 tablet 2  . lisinopril-hydrochlorothiazide (PRINZIDE,ZESTORETIC) 20-25 MG tablet Take 1 tablet by mouth daily. 90 tablet 1   No facility-administered medications prior to visit.     No Known Allergies  Review of Systems  Constitutional: Negative for chills, fever and malaise/fatigue.  HENT: Negative for congestion and hearing loss.   Eyes: Negative for discharge.  Respiratory: Negative for cough, sputum production and shortness of breath.   Cardiovascular: Negative for  chest pain, palpitations and leg swelling.  Gastrointestinal: Negative for abdominal pain, blood in stool, constipation, diarrhea, heartburn, nausea and vomiting.  Genitourinary: Negative for dysuria, frequency, hematuria and urgency.  Musculoskeletal: Positive for joint pain. Negative for back pain, falls and myalgias.  Skin: Negative for rash.  Neurological: Negative for dizziness, sensory change, loss of consciousness, weakness and headaches.  Endo/Heme/Allergies: Negative for environmental allergies. Does not bruise/bleed easily.  Psychiatric/Behavioral: Negative for depression and suicidal ideas. The patient is not nervous/anxious and does not have insomnia.        Objective:      Physical Exam  Constitutional: He is oriented to person, place, and time. He appears well-developed and well-nourished. No distress.  HENT:  Head: Normocephalic and atraumatic.  Eyes: Conjunctivae are normal.  Neck: Neck supple. No thyromegaly present.  Cardiovascular: Normal rate, regular rhythm and normal heart sounds.   No murmur heard. Pulmonary/Chest: Effort normal and breath sounds normal. No respiratory distress. He has no wheezes.  Abdominal: Soft. Bowel sounds are normal. He exhibits no mass. There is no tenderness.  Musculoskeletal: He exhibits no edema.  Lymphadenopathy:    He has no cervical adenopathy.  Neurological: He is alert and oriented to person, place, and time.  Skin: Skin is warm and dry.  Psychiatric: He has a normal mood and affect. His behavior is normal.    BP 117/80   Pulse 64   Temp 98.2 F (36.8 C) (Oral)   Ht 5' 6.6" (1.692 m)   Wt 160 lb (72.6 kg)   SpO2 98%   BMI 25.36 kg/m  Wt Readings from Last 3 Encounters:  05/11/17 160 lb (72.6 kg)  11/12/16 164 lb 9.6 oz (74.7 kg)  08/31/16 160 lb (72.6 kg)   BP Readings from Last 3 Encounters:  05/11/17 117/80  11/12/16 (!) 160/80  08/31/16 (!) 146/74     Immunization History  Administered Date(s) Administered  . Hepatitis A, Adult 03/31/2016  . Pneumococcal Polysaccharide-23 03/31/2016    Health Maintenance  Topic Date Due  . COLONOSCOPY  03/26/2002  . INFLUENZA VACCINE  03/10/2017  . PNA vac Low Risk Adult (1 of 2 - PCV13) 03/31/2017  . TETANUS/TDAP  06/10/2026  . Hepatitis C Screening  Completed  . HIV Screening  Completed    Lab Results  Component Value Date   WBC 8.2 05/11/2017   HGB 12.9 (L) 05/11/2017   HCT 38.7 (L) 05/11/2017   PLT 265.0 05/11/2017   GLUCOSE 103 (H) 05/11/2017   CHOL 165 05/11/2017   TRIG 145.0 05/11/2017   HDL 34.80 (L) 05/11/2017   LDLDIRECT 60 04/05/2015   LDLCALC 101 (H) 05/11/2017   ALT 12 05/11/2017   AST 9 05/11/2017   NA 137 05/11/2017   K 4.1  05/11/2017   CL 102 05/11/2017   CREATININE 1.23 05/11/2017   BUN 20 05/11/2017   CO2 31 05/11/2017   TSH 1.47 05/11/2017   PSA 0.59 08/31/2016   INR 0.96 01/08/2016   HGBA1C 6.1 05/11/2017    Lab Results  Component Value Date   TSH 1.47 05/11/2017   Lab Results  Component Value Date   WBC 8.2 05/11/2017   HGB 12.9 (L) 05/11/2017   HCT 38.7 (L) 05/11/2017   MCV 91.4 05/11/2017   PLT 265.0 05/11/2017   Lab Results  Component Value Date   NA 137 05/11/2017   K 4.1 05/11/2017   CO2 31 05/11/2017   GLUCOSE 103 (H) 05/11/2017   BUN 20 05/11/2017  CREATININE 1.23 05/11/2017   BILITOT 0.4 05/11/2017   ALKPHOS 53 05/11/2017   AST 9 05/11/2017   ALT 12 05/11/2017   PROT 6.7 05/11/2017   ALBUMIN 4.1 05/11/2017   CALCIUM 9.5 05/11/2017   ANIONGAP 11 04/05/2015   GFR 62.74 05/11/2017   Lab Results  Component Value Date   CHOL 165 05/11/2017   Lab Results  Component Value Date   HDL 34.80 (L) 05/11/2017   Lab Results  Component Value Date   LDLCALC 101 (H) 05/11/2017   Lab Results  Component Value Date   TRIG 145.0 05/11/2017   Lab Results  Component Value Date   CHOLHDL 5 05/11/2017   Lab Results  Component Value Date   HGBA1C 6.1 05/11/2017         Assessment & Plan:   Problem List Items Addressed This Visit    Essential hypertension    Well controlled, no changes to meds. Encouraged heart healthy diet such as the DASH diet and exercise as tolerated.       Relevant Medications   atorvastatin (LIPITOR) 80 MG tablet   lisinopril-hydrochlorothiazide (PRINZIDE,ZESTORETIC) 20-25 MG tablet   Other Relevant Orders   CBC (Completed)   Comprehensive metabolic panel (Completed)   TSH (Completed)   Cerebral infarction [facility])    Right sided follows with Dr Leonie Man and [facility] Neurology annually event was roughly 2 years ago.      Tobacco abuse    Encouraged complete cessation. Discussed need to quit as relates to risk of numerous cancers, cardiac and  pulmonary disease as well as neurologic complications. Counseled for greater than 3 minutes. Smokes about a 1/2 ppd he has the 7 mg patch.      HLD (hyperlipidemia)    encouraged heart healthy diet, avoid trans fats, minimize simple carbs and saturated fats. Increase exercise as tolerated      Relevant Medications   atorvastatin (LIPITOR) 80 MG tablet   lisinopril-hydrochlorothiazide (PRINZIDE,ZESTORETIC) 20-25 MG tablet   Other Relevant Orders   Lipid panel (Completed)   Chronic hepatitis C without hepatic coma ([place])    Underwent treatment with Dr Linus Salmons and has been fully treated.      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 Proceed with Cologuard      Left elbow pain    S/p a fall in February of 2018, will xray and refer to sports med. Encouraged topical lidocaine and Tylenol/Acetaminophen.      Relevant Orders   Ambulatory referral to Sports Medicine   DG Elbow 2 Views Left   Right shoulder pain   Relevant Orders   Ambulatory referral to Sports Medicine   DG Shoulder Right   History of chicken pox    Encouraged Shingrix shot      Hyperglycemia - Primary    hgba1c acceptable, minimize simple carbs. Increase exercise as tolerated      Relevant Orders   Hemoglobin A1C (Completed)    Other Visit Diagnoses    Generalized anxiety disorder       Relevant Medications   citalopram (CELEXA) 10 MG tablet   Cerebrovascular accident (CVA)  due to occlusion of precerebral artery (HCC)       Relevant Medications   atorvastatin (LIPITOR) 80 MG tablet   clopidogrel (PLAVIX) 75 MG tablet   lisinopril-hydrochlorothiazide  (PRINZIDE,ZESTORETIC) 20-25 MG tablet      I am having Mr. Strayer maintain his atorvastatin, citalopram, clopidogrel, and lisinopril-hydrochlorothiazide.  Meds ordered this encounter  Medications  . atorvastatin (LIPITOR) 80 MG tablet    Sig: Take 1 tablet (80 mg total) by mouth daily.    Dispense:  90 tablet    Refill:  2  . citalopram (CELEXA) 10 MG tablet    Sig: Take 1 tablet (10 mg total) by mouth daily.    Dispense:  90 tablet    Refill:  2  . clopidogrel (PLAVIX) 75 MG tablet    Sig: Take 1 tablet (75 mg total) by mouth daily.    Dispense:  90 tablet    Refill:  2  . lisinopril-hydrochlorothiazide (PRINZIDE,ZESTORETIC) 20-25 MG tablet    Sig: Take 1 tablet by mouth daily.    Dispense:  90 tablet    Refill:  1    CMA served as Education administrator during this visit. History, Physical and Plan performed by medical provider. Documentation and orders reviewed and attested to.  Penni Homans, MD

## 2017-05-16 DIAGNOSIS — R739 Hyperglycemia, unspecified: Secondary | ICD-10-CM | POA: Insufficient documentation

## 2017-05-16 NOTE — Assessment & Plan Note (Signed)
hgba1c acceptable, minimize simple carbs. Increase exercise as tolerated.  

## 2017-05-16 NOTE — Assessment & Plan Note (Signed)
Underwent treatment with Dr Linus Salmons and has been fully treated.

## 2017-06-04 ENCOUNTER — Ambulatory Visit (HOSPITAL_BASED_OUTPATIENT_CLINIC_OR_DEPARTMENT_OTHER)
Admission: RE | Admit: 2017-06-04 | Discharge: 2017-06-04 | Disposition: A | Discharge status: Home / Self Care | Payer: Medicare Other | Source: Ambulatory Visit | Attending: Family Medicine | Admitting: Family Medicine

## 2017-06-04 ENCOUNTER — Ambulatory Visit (INDEPENDENT_AMBULATORY_CARE_PROVIDER_SITE_OTHER): Payer: Medicare Other | Admitting: Family Medicine

## 2017-06-04 ENCOUNTER — Encounter: Payer: Self-pay | Admitting: Family Medicine

## 2017-06-04 VITALS — BP 155/84 | HR 76 | Ht 67.0 in | Wt 150.0 lb

## 2017-06-04 DIAGNOSIS — M25522 Pain in left elbow: Secondary | ICD-10-CM | POA: Diagnosis not present

## 2017-06-04 DIAGNOSIS — M25512 Pain in left shoulder: Secondary | ICD-10-CM

## 2017-06-04 DIAGNOSIS — I63511 Cerebral infarction due to unspecified occlusion or stenosis of right middle cerebral artery: Secondary | ICD-10-CM

## 2017-06-04 DIAGNOSIS — S4991XA Unspecified injury of right shoulder and upper arm, initial encounter: Secondary | ICD-10-CM | POA: Diagnosis not present

## 2017-06-04 DIAGNOSIS — M19012 Primary osteoarthritis, left shoulder: Secondary | ICD-10-CM | POA: Diagnosis not present

## 2017-06-04 NOTE — Patient Instructions (Signed)
I would encourage you to get the x-rays of your right shoulder and left elbow - it's possible you fractured these and they're healing but we won't know without the x-rays. Do the strengthening exercises for your right shoulder 3 sets of 10 once a day for next 6 weeks.  You have a frozen shoulder (adhesive capsulitis) of your left shoulder, a buildup of scar tissue that limits motion of the shoulder joint. Limit lifting and overhead activities as much as possible. Heat 15 minutes at a time 3-4 times a day may help with movement and stiffness. Tylenol as needed for pain. Steroid injections in a series have been shown to help with pain and motion. Codman exercises (pendulum, wall walking or table slides, arm circles) - do 3 sets of 10 once or twice a day. Consider physical therapy for this, your right shoulder, and your left elbow. Follow up in 6 weeks

## 2017-06-06 DIAGNOSIS — M25512 Pain in left shoulder: Secondary | ICD-10-CM | POA: Insufficient documentation

## 2017-06-06 DIAGNOSIS — S4991XA Unspecified injury of right shoulder and upper arm, initial encounter: Secondary | ICD-10-CM | POA: Insufficient documentation

## 2017-06-06 NOTE — Assessment & Plan Note (Signed)
normal exam despite reporting pain is bad at nighttime, wakes him up.  Encouraged to get the x-rays for this also.

## 2017-06-06 NOTE — Assessment & Plan Note (Signed)
2/2 fall about 2-3 months ago (though he reported this was February to PCP).  I encouraged him to get x-rays but he told imaging downstairs he did not want them and only wanted left shoulder radiographs - citing if pain gets worse he will get these.  His exam is reassuring with only evidence of mild impingement but given mechanism, persistent pain, I encouraged him to reconsider and get the x-rays.  Shown basic strengthening exercises.  Tylenol, heat.

## 2017-06-06 NOTE — Progress Notes (Signed)
PCP: Mosie Lukes, MD  Subjective:   HPI: Patient is a 66 y.o. male here for left elbow, right shoulder pain.  Patient reports about 2-3 months ago he fell forward injuring left elbow and right shoulder. He's continued to have problems with both areas though they have improved from the initial injuries. Pain level up to 2-3/10 in both though now 0/10. Can be sharp and wake hip up at nighttime. Shoulder pain is anterior, elbow pain is posterior. Right handed. Tried tylenol. No skin changes, numbness.  Past Medical History:  Diagnosis Date  . Anxiety   . Depression   . Depression with anxiety 01/08/2016  . H/O ischemic right MCA stroke   . History of chicken pox 05/11/2017  . Hyperlipidemia   . Hypertension   . Liver disease    hepatitis c  . Right shoulder pain 05/11/2017  . Stroke [facility])     Current Outpatient Prescriptions on File Prior to Visit  Medication Sig Dispense Refill  . atorvastatin (LIPITOR) 80 MG tablet Take 1 tablet (80 mg total) by mouth daily. 90 tablet 2  . citalopram (CELEXA) 10 MG tablet Take 1 tablet (10 mg total) by mouth daily. 90 tablet 2  . clopidogrel (PLAVIX) 75 MG tablet Take 1 tablet (75 mg total) by mouth daily. 90 tablet 2  . lisinopril-hydrochlorothiazide (PRINZIDE,ZESTORETIC) 20-25 MG tablet Take 1 tablet by mouth daily. 90 tablet 1   No current facility-administered medications on file prior to visit.     No past surgical history on file.  No Known Allergies  Social History   Social History  . Marital status: Married    Spouse name: N/A  . Number of children: 2  . Years of education: 12   Occupational History  . Grading Contractor    Social History Main Topics  . Smoking status: Former Smoker    Quit date: 01/26/2015  . Smokeless tobacco: Never Used  . Alcohol use No  . Drug use: No  . Sexual activity: Not Currently   Other Topics Concern  . Not on file   Social History Narrative   Fun: Fish, collectable cars/classic  cars   Denies religious beliefs effecting health care   Retired from Therapist, occupational   Lives with wife   No dietary restrictions, wears seat belt.     Family History  Problem Relation Age of Onset  . Heart disease Mother        MI  . Heart attack Mother   . Asthma Mother   . Hyperlipidemia Mother   . Hypertension Mother   . Diabetes Father   . Healthy Maternal Grandmother   . Healthy Maternal Grandfather   . Healthy Paternal Grandmother   . Healthy Paternal Grandfather   . Hyperlipidemia Sister     BP (!) 155/84   Pulse 76   Ht 5\' 7"  (1.702 m)   Wt 150 lb (68 kg)   BMI 23.49 kg/m   Review of Systems: See HPI above.     Objective:  Physical Exam:  Gen: NAD, comfortable in exam room  Right shoulder: No swelling, ecchymoses.  No gross deformity. No TTP. FROM with painful arc. Negative Hawkins, Neers. Negative Yergasons. Strength 5/5 with empty can and resisted internal/external rotation. Negative apprehension. NV intact distally.  Left shoulder: No swelling, ecchymoses.  No gross deformity. No TTP. ROM limited to 10 degrees ER, 90 degrees abduction and flexion with active and passive motion. Negative Hawkins, Neers. Negative Yergasons. Strength 5/5  with empty can and resisted internal/external rotation. NV intact distally.  Left elbow: No gross deformity, swelling, bruising. No TTP currently. FROM without pain. 5/5 strength with flexion and extension, with wrist motions also. Collateral ligaments intact. NVI distally.   Assessment & Plan:  1. Right shoulder injury - 2/2 fall about 2-3 months ago (though he reported this was February to PCP).  I encouraged him to get x-rays but he told imaging downstairs he did not want them and only wanted left shoulder radiographs - citing if pain gets worse he will get these.  His exam is reassuring with only evidence of mild impingement but given mechanism, persistent pain, I encouraged him to reconsider and  get the x-rays.  Shown basic strengthening exercises.  Tylenol, heat.  2. Left elbow pain - normal exam despite reporting pain is bad at nighttime, wakes him up.  Encouraged to get the x-rays for this also.  3. Left shoulder pain - more a limitation of motion than pain.  Independently reviewed radiographs and no evidence fracture, arthritis bad enough to cause motion to be this poor.  Consistent with adhesive capsulitis.  Heat,, tylenol,  Consider injection.  Shown home exercises to do daily.  F/u in 6 weeks.

## 2017-06-06 NOTE — Assessment & Plan Note (Signed)
more a limitation of motion than pain.  Independently reviewed radiographs and no evidence fracture, arthritis bad enough to cause motion to be this poor.  Consistent with adhesive capsulitis.  Heat,, tylenol,  Consider injection.  Shown home exercises to do daily.  F/u in 6 weeks.

## 2017-07-16 ENCOUNTER — Ambulatory Visit: Payer: Medicare Other | Admitting: Family Medicine

## 2017-11-09 ENCOUNTER — Encounter: Payer: BLUE CROSS/BLUE SHIELD | Admitting: Family Medicine

## 2018-01-24 ENCOUNTER — Ambulatory Visit (INDEPENDENT_AMBULATORY_CARE_PROVIDER_SITE_OTHER): Payer: Medicare HMO | Admitting: Family Medicine

## 2018-01-24 VITALS — BP 116/62 | HR 62 | Temp 98.2°F | Resp 18 | Ht 66.0 in | Wt 160.0 lb

## 2018-01-24 DIAGNOSIS — I10 Essential (primary) hypertension: Secondary | ICD-10-CM | POA: Diagnosis not present

## 2018-01-24 DIAGNOSIS — K625 Hemorrhage of anus and rectum: Secondary | ICD-10-CM

## 2018-01-24 DIAGNOSIS — Z720 Tobacco use: Secondary | ICD-10-CM

## 2018-01-24 DIAGNOSIS — Z23 Encounter for immunization: Secondary | ICD-10-CM

## 2018-01-24 DIAGNOSIS — Z0000 Encounter for general adult medical examination without abnormal findings: Secondary | ICD-10-CM

## 2018-01-24 DIAGNOSIS — R739 Hyperglycemia, unspecified: Secondary | ICD-10-CM

## 2018-01-24 DIAGNOSIS — D649 Anemia, unspecified: Secondary | ICD-10-CM

## 2018-01-24 DIAGNOSIS — Z0001 Encounter for general adult medical examination with abnormal findings: Secondary | ICD-10-CM

## 2018-01-24 DIAGNOSIS — E782 Mixed hyperlipidemia: Secondary | ICD-10-CM

## 2018-01-24 MED ORDER — BUPROPION HCL ER (XL) 300 MG PO TB24: 300.0000 mg | ORAL_TABLET | Freq: Every day | ORAL | 5 refills | Status: DC

## 2018-01-24 MED ORDER — BUPROPION HCL ER (XL) 150 MG PO TB24: 150.0000 mg | ORAL_TABLET | Freq: Every day | ORAL | 0 refills | Status: DC

## 2018-01-24 NOTE — Patient Instructions (Signed)
shingrix is the new shingles shot. 2 shots over 2-6 months. Preventive Care 40-64 Years, Male Preventive care refers to lifestyle choices and visits with your health care provider that can promote health and wellness. What does preventive care include?  A yearly physical exam. This is also called an annual well check.  Dental exams once or twice a year.  Routine eye exams. Ask your health care provider how often you should have your eyes checked.  Personal lifestyle choices, including: ? Daily care of your teeth and gums. ? Regular physical activity. ? Eating a healthy diet. ? Avoiding tobacco and drug use. ? Limiting alcohol use. ? Practicing safe sex. ? Taking low-dose aspirin every day starting at age 38. What happens during an annual well check? The services and screenings done by your health care provider during your annual well check will depend on your age, overall health, lifestyle risk factors, and family history of disease. Counseling Your health care provider may ask you questions about your:  Alcohol use.  Tobacco use.  Drug use.  Emotional well-being.  Home and relationship well-being.  Sexual activity.  Eating habits.  Work and work Statistician.  Screening You may have the following tests or measurements:  Height, weight, and BMI.  Blood pressure.  Lipid and cholesterol levels. These may be checked every 5 years, or more frequently if you are over 6 years old.  Skin check.  Lung cancer screening. You may have this screening every year starting at age 15 if you have a 30-pack-year history of smoking and currently smoke or have quit within the past 15 years.  Fecal occult blood test (FOBT) of the stool. You may have this test every year starting at age 26.  Flexible sigmoidoscopy or colonoscopy. You may have a sigmoidoscopy every 5 years or a colonoscopy every 10 years starting at age 44.  Prostate cancer screening. Recommendations will vary  depending on your family history and other risks.  Hepatitis C blood test.  Hepatitis B blood test.  Sexually transmitted disease (STD) testing.  Diabetes screening. This is done by checking your blood sugar (glucose) after you have not eaten for a while (fasting). You may have this done every 1-3 years.  Discuss your test results, treatment options, and if necessary, the need for more tests with your health care provider. Vaccines Your health care provider may recommend certain vaccines, such as:  Influenza vaccine. This is recommended every year.  Tetanus, diphtheria, and acellular pertussis (Tdap, Td) vaccine. You may need a Td booster every 10 years.  Varicella vaccine. You may need this if you have not been vaccinated.  Zoster vaccine. You may need this after age 26.  Measles, mumps, and rubella (MMR) vaccine. You may need at least one dose of MMR if you were born in 1957 or later. You may also need a second dose.  Pneumococcal 13-valent conjugate (PCV13) vaccine. You may need this if you have certain conditions and have not been vaccinated.  Pneumococcal polysaccharide (PPSV23) vaccine. You may need one or two doses if you smoke cigarettes or if you have certain conditions.  Meningococcal vaccine. You may need this if you have certain conditions.  Hepatitis A vaccine. You may need this if you have certain conditions or if you travel or work in places where you may be exposed to hepatitis A.  Hepatitis B vaccine. You may need this if you have certain conditions or if you travel or work in places where you may be  exposed to hepatitis B.  Haemophilus influenzae type b (Hib) vaccine. You may need this if you have certain risk factors.  Talk to your health care provider about which screenings and vaccines you need and how often you need them. This information is not intended to replace advice given to you by your health care provider. Make sure you discuss any questions you have  with your health care provider. Document Released: 08/23/2015 Document Revised: 04/15/2016 Document Reviewed: 05/28/2015 Elsevier Interactive Patient Education  [employer].

## 2018-01-24 NOTE — Progress Notes (Signed)
Subjective:  I acted as a Education administrator for Dr. Charlett Blake. [place], [place]  Patient ID: Ray Williams, male    DOB: 04/04/1951, 67 y.o.   MRN: [id number]  Chief Complaint  Patient presents with  . Annual Exam    HPI  Patient is in today for an annual exam and follow up on chronic medical concerns including hyperglycemia, hyperlipidemia, depression and more. He reports he is feeling well. No recent febrile illness or hospitalizations. Is not smoking. No polyuria or polydipsia. Notes some persistent right shoulder pain but is following with sports med and it is improving. Has some scant rectal bleeding at times on tissue. Does well with activities of daily living. Is trying to maintain a heart healthy diet and stay active. Denies CP/palp/SOB/HA/congestion/fevers/GI or GU c/o. Taking meds as prescribed  Patient Care Team: Mosie Lukes, MD as PCP - General (Family Medicine)   Past Medical History:  Diagnosis Date  . Anemia 01/26/2018  . Anxiety   . Depression   . Depression with anxiety 01/08/2016  . H/O ischemic right MCA stroke   . History of chicken pox 05/11/2017  . Hyperlipidemia   . Hypertension   . Liver disease    hepatitis c  . Right shoulder pain 05/11/2017  . Stroke [facility])     No past surgical history on file.  Family History  Problem Relation Age of Onset  . Heart disease Mother        MI  . Heart attack Mother   . Asthma Mother   . Hyperlipidemia Mother   . Hypertension Mother   . Diabetes Father   . Healthy Maternal Grandmother   . Healthy Maternal Grandfather   . Healthy Paternal Grandmother   . Healthy Paternal Grandfather   . Hyperlipidemia Sister     Social History   Socioeconomic History  . Marital status: Married    Spouse name: Not on file  . Number of children: 2  . Years of education: 5  . Highest education level: Not on file  Occupational History  . Occupation: Teaching laboratory technician  Social Needs  . Financial resource strain: Not on file  . Food  insecurity:    Worry: Not on file    Inability: Not on file  . Transportation needs:    Medical: Not on file    Non-medical: Not on file  Tobacco Use  . Smoking status: Former Smoker    Last attempt to quit: 01/26/2015    Years since quitting: 3.0  . Smokeless tobacco: Never Used  Substance and Sexual Activity  . Alcohol use: No    Alcohol/week: 0.0 oz  . Drug use: No  . Sexual activity: Not Currently  Lifestyle  . Physical activity:    Days per week: Not on file    Minutes per session: Not on file  . Stress: Not on file  Relationships  . Social connections:    Talks on phone: Not on file    Gets together: Not on file    Attends religious service: Not on file    Active member of club or organization: Not on file    Attends meetings of clubs or organizations: Not on file    Relationship status: Not on file  . Intimate partner violence:    Fear of current or ex partner: Not on file    Emotionally abused: Not on file    Physically abused: Not on file    Forced sexual activity: Not on file  Other  Topics Concern  . Not on file  Social History Narrative   Fun: Fish, collectable cars/classic cars   Denies religious beliefs effecting health care   Retired from Therapist, occupational   Lives with wife   No dietary restrictions, wears seat belt.     Outpatient Medications Prior to Visit  Medication Sig Dispense Refill  . atorvastatin (LIPITOR) 80 MG tablet Take 1 tablet (80 mg total) by mouth daily. 90 tablet 2  . citalopram (CELEXA) 10 MG tablet Take 1 tablet (10 mg total) by mouth daily. 90 tablet 2  . clopidogrel (PLAVIX) 75 MG tablet Take 1 tablet (75 mg total) by mouth daily. 90 tablet 2  . lisinopril-hydrochlorothiazide (PRINZIDE,ZESTORETIC) 20-25 MG tablet Take 1 tablet by mouth daily. 90 tablet 1   No facility-administered medications prior to visit.     No Known Allergies  Review of Systems  Constitutional: Negative for chills, fever and malaise/fatigue.    HENT: Negative for congestion and hearing loss.   Eyes: Negative for discharge.  Respiratory: Negative for cough, sputum production and shortness of breath.   Cardiovascular: Negative for chest pain, palpitations and leg swelling.  Gastrointestinal: Positive for blood in stool. Negative for abdominal pain, constipation, diarrhea, heartburn, melena, nausea and vomiting.  Genitourinary: Negative for dysuria, frequency, hematuria and urgency.  Musculoskeletal: Positive for joint pain. Negative for back pain, falls and myalgias.  Skin: Negative for rash.  Neurological: Negative for dizziness, sensory change, loss of consciousness, weakness and headaches.  Endo/Heme/Allergies: Negative for environmental allergies. Does not bruise/bleed easily.  Psychiatric/Behavioral: Negative for depression and suicidal ideas. The patient is not nervous/anxious and does not have insomnia.        Objective:    Physical Exam  Constitutional: He is oriented to person, place, and time. He appears well-developed and well-nourished. No distress.  HENT:  Head: Normocephalic and atraumatic.  Eyes: Conjunctivae are normal.  Neck: Neck supple. No thyromegaly present.  Cardiovascular: Normal rate, regular rhythm and normal heart sounds.  No murmur heard. Pulmonary/Chest: Effort normal and breath sounds normal. No respiratory distress. He has no wheezes.  Abdominal: Soft. Bowel sounds are normal. He exhibits no mass. There is no tenderness.  Musculoskeletal: He exhibits no edema.  Lymphadenopathy:    He has no cervical adenopathy.  Neurological: He is alert and oriented to person, place, and time.  Skin: Skin is warm and dry.  Psychiatric: He has a normal mood and affect. His behavior is normal.    BP 116/62 (BP Location: Right Arm, Patient Position: Sitting, Cuff Size: Normal)   Pulse 62   Temp 98.2 F (36.8 C) (Oral)   Resp 18   Ht 5\' 6"  (1.676 m)   Wt 160 lb (72.6 kg)   SpO2 97%   BMI 25.82 kg/m  Wt  Readings from Last 3 Encounters:  01/24/18 160 lb (72.6 kg)  06/04/17 150 lb (68 kg)  05/11/17 160 lb (72.6 kg)   BP Readings from Last 3 Encounters:  01/24/18 116/62  06/04/17 (!) 155/84  05/11/17 117/80     Immunization History  Administered Date(s) Administered  . Hepatitis A, Adult 03/31/2016  . Pneumococcal Conjugate-13 01/24/2018  . Pneumococcal Polysaccharide-23 03/31/2016    Health Maintenance  Topic Date Due  . COLONOSCOPY  03/26/2002  . INFLUENZA VACCINE  03/10/2018  . PNA vac Low Risk Adult (2 of 2 - PPSV23) 03/31/2021  . TETANUS/TDAP  06/10/2026  . Hepatitis C Screening  Completed  . HIV Screening  Completed  Lab Results  Component Value Date   WBC 8.3 01/24/2018   HGB 12.4 (L) 01/24/2018   HCT 35.8 (L) 01/24/2018   PLT 247.0 01/24/2018   GLUCOSE 89 01/24/2018   CHOL 117 01/24/2018   TRIG 246.0 (H) 01/24/2018   HDL 27.80 (L) 01/24/2018   LDLDIRECT 60.0 01/24/2018   LDLCALC 101 (H) 05/11/2017   ALT 15 01/24/2018   AST 8 01/24/2018   NA 139 01/24/2018   K 4.5 01/24/2018   CL 104 01/24/2018   CREATININE 1.42 01/24/2018   BUN 23 01/24/2018   CO2 29 01/24/2018   TSH 1.18 01/24/2018   PSA 0.59 08/31/2016   INR 0.96 01/08/2016   HGBA1C 5.9 01/24/2018    Lab Results  Component Value Date   TSH 1.18 01/24/2018   Lab Results  Component Value Date   WBC 8.3 01/24/2018   HGB 12.4 (L) 01/24/2018   HCT 35.8 (L) 01/24/2018   MCV 89.8 01/24/2018   PLT 247.0 01/24/2018   Lab Results  Component Value Date   NA 139 01/24/2018   K 4.5 01/24/2018   CO2 29 01/24/2018   GLUCOSE 89 01/24/2018   BUN 23 01/24/2018   CREATININE 1.42 01/24/2018   BILITOT 0.3 01/24/2018   ALKPHOS 60 01/24/2018   AST 8 01/24/2018   ALT 15 01/24/2018   PROT 6.6 01/24/2018   ALBUMIN 4.2 01/24/2018   CALCIUM 9.4 01/24/2018   ANIONGAP 11 04/05/2015   GFR 53.04 (L) 01/24/2018   Lab Results  Component Value Date   CHOL 117 01/24/2018   Lab Results  Component Value  Date   HDL 27.80 (L) 01/24/2018   Lab Results  Component Value Date   LDLCALC 101 (H) 05/11/2017   Lab Results  Component Value Date   TRIG 246.0 (H) 01/24/2018   Lab Results  Component Value Date   CHOLHDL 4 01/24/2018   Lab Results  Component Value Date   HGBA1C 5.9 01/24/2018         Assessment & Plan:   Problem List Items Addressed This Visit    Essential hypertension    Well controlled, no changes to meds. Encouraged heart healthy diet such as the DASH diet and exercise as tolerated.       Relevant Orders   CBC (Completed)   Comprehensive metabolic panel (Completed)   TSH (Completed)   Tobacco abuse    Not currently smoking.       HLD (hyperlipidemia) - Primary   Relevant Orders   Lipid panel (Completed)   Routine general medical examination at a health care facility    Patient encouraged to maintain heart healthy diet, regular exercise, adequate sleep. Consider daily probiotics. Take medications as prescribed. Labs revuewed,       Hyperglycemia    hgba1c acceptable, minimize simple carbs. Increase exercise as tolerated.       Relevant Orders   Hemoglobin A1c (Completed)   Rectal bleeding    Infrequent small amount on tissue at times. Check IFOB and report worsening symptoms      Relevant Orders   Fecal occult blood, imunochemical([facility])   Anemia    Check Ifob, monitor. Increase leafy greens, consider increased lean red meat and using cast iron cookware. Continue to monitor, report any concerns         I am having [name] start on buPROPion and buPROPion. I am also having him maintain his atorvastatin, citalopram, clopidogrel, and lisinopril-hydrochlorothiazide.  Meds ordered this encounter  Medications  . buPROPion (  WELLBUTRIN XL) 150 MG 24 hr tablet    Sig: Take 1 tablet (150 mg total) by mouth daily.    Dispense:  7 tablet    Refill:  0  . buPROPion (WELLBUTRIN XL) 300 MG 24 hr tablet    Sig: Take 1 tablet (300 mg  total) by mouth daily.    Dispense:  30 tablet    Refill:  5    CMA served as scribe during this visit. History, Physical and Plan performed by medical provider. Documentation and orders reviewed and attested to.  Penni Homans, MD

## 2018-01-25 LAB — CBC
HEMATOCRIT: 35.8 % — AB (ref 39.0–52.0)
Hemoglobin: 12.4 g/dL — ABNORMAL LOW (ref 13.0–17.0)
MCHC: 34.6 g/dL (ref 30.0–36.0)
MCV: 89.8 fl (ref 78.0–100.0)
Platelets: 247 10*3/uL (ref 150.0–400.0)
RBC: 3.99 Mil/uL — AB (ref 4.22–5.81)
RDW: 14.1 % (ref 11.5–15.5)
WBC: 8.3 10*3/uL (ref 4.0–10.5)

## 2018-01-25 LAB — COMPREHENSIVE METABOLIC PANEL
ALBUMIN: 4.2 g/dL (ref 3.5–5.2)
ALK PHOS: 60 U/L (ref 39–117)
ALT: 15 U/L (ref 0–53)
AST: 8 U/L (ref 0–37)
BUN: 23 mg/dL (ref 6–23)
CALCIUM: 9.4 mg/dL (ref 8.4–10.5)
CO2: 29 mEq/L (ref 19–32)
Chloride: 104 mEq/L (ref 96–112)
Creatinine, Ser: 1.42 mg/dL (ref 0.40–1.50)
GFR: 53.04 mL/min — AB (ref >60.00)
Glucose, Bld: 89 mg/dL (ref 70–99)
POTASSIUM: 4.5 meq/L (ref 3.5–5.1)
Sodium: 139 mEq/L (ref 135–145)
TOTAL PROTEIN: 6.6 g/dL (ref 6.0–8.3)
Total Bilirubin: 0.3 mg/dL (ref 0.2–1.2)

## 2018-01-25 LAB — LIPID PANEL
CHOLESTEROL: 117 mg/dL (ref 0–200)
HDL: 27.8 mg/dL — AB (ref >39.00)
NonHDL: 89.12
TRIGLYCERIDES: 246 mg/dL — AB (ref 0.0–149.0)
Total CHOL/HDL Ratio: 4
VLDL: 49.2 mg/dL — AB (ref 0.0–40.0)

## 2018-01-25 LAB — TSH: TSH: 1.18 u[IU]/mL (ref 0.35–4.50)

## 2018-01-25 LAB — HEMOGLOBIN A1C: Hgb A1c MFr Bld: 5.9 % (ref 4.6–6.5)

## 2018-01-25 LAB — LDL CHOLESTEROL, DIRECT: LDL DIRECT: 60 mg/dL

## 2018-01-26 ENCOUNTER — Encounter: Payer: Self-pay | Admitting: Family Medicine

## 2018-01-26 DIAGNOSIS — K625 Hemorrhage of anus and rectum: Secondary | ICD-10-CM | POA: Insufficient documentation

## 2018-01-26 DIAGNOSIS — D649 Anemia, unspecified: Secondary | ICD-10-CM

## 2018-01-26 HISTORY — DX: Anemia, unspecified: D64.9

## 2018-01-26 NOTE — Assessment & Plan Note (Signed)
Patient encouraged to maintain heart healthy diet, regular exercise, adequate sleep. Consider daily probiotics. Take medications as prescribed. Labs revuewed,

## 2018-01-26 NOTE — Assessment & Plan Note (Signed)
Well controlled, no changes to meds. Encouraged heart healthy diet such as the DASH diet and exercise as tolerated.  °

## 2018-01-26 NOTE — Assessment & Plan Note (Signed)
hgba1c acceptable, minimize simple carbs. Increase exercise as tolerated.  

## 2018-01-26 NOTE — Assessment & Plan Note (Addendum)
Not currently smoking 

## 2018-01-26 NOTE — Assessment & Plan Note (Signed)
Check Ifob, monitor. Increase leafy greens, consider increased lean red meat and using cast iron cookware. Continue to monitor, report any concerns

## 2018-01-26 NOTE — Assessment & Plan Note (Addendum)
Infrequent small amount on tissue at times. Check IFOB and report worsening symptoms. He declines colonoscopy

## 2018-02-23 ENCOUNTER — Other Ambulatory Visit: Payer: Self-pay | Admitting: Family Medicine

## 2018-02-23 DIAGNOSIS — F411 Generalized anxiety disorder: Secondary | ICD-10-CM

## 2018-02-23 NOTE — Telephone Encounter (Signed)
Patient doesn't have anymore of his citalopram (CELEXA) 10 MG tablet left.

## 2018-03-16 ENCOUNTER — Other Ambulatory Visit: Payer: Self-pay | Admitting: Family Medicine

## 2018-03-16 DIAGNOSIS — I10 Essential (primary) hypertension: Secondary | ICD-10-CM

## 2018-03-23 DIAGNOSIS — Z1212 Encounter for screening for malignant neoplasm of rectum: Secondary | ICD-10-CM | POA: Diagnosis not present

## 2018-03-23 DIAGNOSIS — Z1211 Encounter for screening for malignant neoplasm of colon: Secondary | ICD-10-CM | POA: Diagnosis not present

## 2018-03-23 LAB — COLOGUARD: Cologuard: NEGATIVE

## 2018-03-24 LAB — COLOGUARD: Cologuard: NEGATIVE

## 2018-04-01 ENCOUNTER — Telehealth: Payer: Self-pay | Admitting: *Deleted

## 2018-04-01 NOTE — Telephone Encounter (Signed)
Received Cologuard Results; forwarded to provider/SLS 08/23

## 2018-04-12 ENCOUNTER — Encounter: Payer: Self-pay | Admitting: Family Medicine

## 2018-04-27 ENCOUNTER — Other Ambulatory Visit: Payer: Self-pay | Admitting: Family Medicine

## 2018-04-27 DIAGNOSIS — I6320 Cerebral infarction due to unspecified occlusion or stenosis of unspecified precerebral arteries: Secondary | ICD-10-CM

## 2018-05-17 ENCOUNTER — Telehealth: Payer: Self-pay

## 2018-05-17 NOTE — Telephone Encounter (Signed)
Copied from [place] [phone]. Topic: General - Other >> May 17, 2018 12:41 PM Yvette Rack wrote: Reason for CRM: Pt wife LuAnn requests pt cologuard results.    Found form from 01/24/18 that was faxed over to [employer]. Will call [phone] to see where his results are.

## 2018-05-19 ENCOUNTER — Other Ambulatory Visit: Payer: Self-pay | Admitting: Family Medicine

## 2018-05-20 ENCOUNTER — Telehealth: Payer: Self-pay | Admitting: Family Medicine

## 2018-05-20 MED ORDER — ATORVASTATIN CALCIUM 80 MG PO TABS: 80.0000 mg | ORAL_TABLET | Freq: Every day | ORAL | 2 refills | Status: DC

## 2018-05-20 NOTE — Telephone Encounter (Signed)
Rx resent.

## 2018-05-20 NOTE — Telephone Encounter (Signed)
Spoke with Linus Orn at [facility] [employer] she stated that patients cologuard was negative and she will send the results to [place] via fax   Patient made aware

## 2018-05-20 NOTE — Telephone Encounter (Signed)
Copied from [place] [phone]. Topic: Quick Communication - See Telephone Encounter >> May 20, 2018  9:17 AM Bea Graff, NT wrote: CRM for notification. See Telephone encounter for: 05/20/18. Pts wife states that the pharmacy stated they did not receive the atorvastatin (LIPITOR) 80 MG tablet on 05/11/18 and would like for the medication to be resent.

## 2018-05-23 ENCOUNTER — Telehealth: Payer: Self-pay | Admitting: *Deleted

## 2018-05-23 NOTE — Telephone Encounter (Signed)
Received Cologuard Results; forwarded to provider/SLS 10/14

## 2018-06-03 ENCOUNTER — Encounter: Payer: Self-pay | Admitting: Family Medicine

## 2018-07-26 ENCOUNTER — Ambulatory Visit (INDEPENDENT_AMBULATORY_CARE_PROVIDER_SITE_OTHER): Payer: Medicare HMO | Admitting: Family Medicine

## 2018-07-26 ENCOUNTER — Encounter: Payer: Self-pay | Admitting: Family Medicine

## 2018-07-26 DIAGNOSIS — R739 Hyperglycemia, unspecified: Secondary | ICD-10-CM

## 2018-07-26 DIAGNOSIS — I10 Essential (primary) hypertension: Secondary | ICD-10-CM | POA: Diagnosis not present

## 2018-07-26 DIAGNOSIS — F418 Other specified anxiety disorders: Secondary | ICD-10-CM

## 2018-07-26 DIAGNOSIS — Z720 Tobacco use: Secondary | ICD-10-CM | POA: Diagnosis not present

## 2018-07-26 DIAGNOSIS — D649 Anemia, unspecified: Secondary | ICD-10-CM | POA: Diagnosis not present

## 2018-07-26 DIAGNOSIS — E782 Mixed hyperlipidemia: Secondary | ICD-10-CM | POA: Diagnosis not present

## 2018-07-26 DIAGNOSIS — R69 Illness, unspecified: Secondary | ICD-10-CM | POA: Diagnosis not present

## 2018-07-26 LAB — COMPREHENSIVE METABOLIC PANEL
ALT: 13 U/L (ref 0–53)
AST: 6 U/L (ref 0–37)
Albumin: 4.2 g/dL (ref 3.5–5.2)
Alkaline Phosphatase: 74 U/L (ref 39–117)
BILIRUBIN TOTAL: 0.4 mg/dL (ref 0.2–1.2)
BUN: 19 mg/dL (ref 6–23)
CO2: 28 meq/L (ref 19–32)
Calcium: 9.1 mg/dL (ref 8.4–10.5)
Chloride: 104 mEq/L (ref 96–112)
Creatinine, Ser: 1.21 mg/dL (ref 0.40–1.50)
GFR: 63.7 mL/min (ref >60.00)
Glucose, Bld: 128 mg/dL — ABNORMAL HIGH (ref 70–99)
Potassium: 4.3 mEq/L (ref 3.5–5.1)
Sodium: 138 mEq/L (ref 135–145)
Total Protein: 6.6 g/dL (ref 6.0–8.3)

## 2018-07-26 LAB — CBC
HCT: 38.2 % — ABNORMAL LOW (ref 39.0–52.0)
Hemoglobin: 12.8 g/dL — ABNORMAL LOW (ref 13.0–17.0)
MCHC: 33.4 g/dL (ref 30.0–36.0)
MCV: 90.8 fl (ref 78.0–100.0)
Platelets: 279 10*3/uL (ref 150.0–400.0)
RBC: 4.21 Mil/uL — ABNORMAL LOW (ref 4.22–5.81)
RDW: 13.8 % (ref 11.5–15.5)
WBC: 7.8 10*3/uL (ref 4.0–10.5)

## 2018-07-26 LAB — LIPID PANEL
Cholesterol: 123 mg/dL (ref 0–200)
HDL: 34.6 mg/dL — ABNORMAL LOW (ref >39.00)
LDL Cholesterol: 65 mg/dL (ref 0–99)
NonHDL: 88.89
Total CHOL/HDL Ratio: 4
Triglycerides: 120 mg/dL (ref 0.0–149.0)
VLDL: 24 mg/dL (ref 0.0–40.0)

## 2018-07-26 LAB — TSH: TSH: 0.95 u[IU]/mL (ref 0.35–4.50)

## 2018-07-26 LAB — HEMOGLOBIN A1C: Hgb A1c MFr Bld: 5.7 % (ref 4.6–6.5)

## 2018-07-26 NOTE — Assessment & Plan Note (Signed)
Increase leafy greens, consider increased lean red meat and using cast iron cookware. Continue to monitor, report any concerns 

## 2018-07-26 NOTE — Patient Instructions (Signed)

## 2018-07-26 NOTE — Assessment & Plan Note (Signed)
hgba1c acceptable, minimize simple carbs. Increase exercise as tolerated.  

## 2018-07-26 NOTE — Assessment & Plan Note (Signed)
Encouraged heart healthy diet, increase exercise, avoid trans fats, consider a krill oil cap daily 

## 2018-07-26 NOTE — Assessment & Plan Note (Signed)
Did not tolerate the Wellbutrin but is doing well on the Citalopram 10 mg daily.

## 2018-07-26 NOTE — Assessment & Plan Note (Signed)
Well controlled, no changes to meds. Encouraged heart healthy diet such as the DASH diet and exercise as tolerated.  °

## 2018-07-28 NOTE — Assessment & Plan Note (Signed)
Still smoking 1 ppd. Encouraged complete cessation. Discussed need to quit as relates to risk of numerous cancers, cardiac and pulmonary disease as well as neurologic complications. Counseled for greater than 3 minutes. He declines assistance presently

## 2018-07-28 NOTE — Progress Notes (Signed)
Subjective:    Patient ID: Ray Williams, male    DOB: 1950-11-18, 67 y.o.   MRN: [id number]  No chief complaint on file.   HPI Patient is in today for follow up. No recent febrile illness or hospitalizations. He is accompanied by his wife who agrees. Unfortunately he continues to smoke a pack per day. No acute concerns. No polyuria or polydipsia. Denies CP/palp/SOB/HA/congestion/fevers/GI or GU c/o. Taking meds as prescribed  Past Medical History:  Diagnosis Date  . Anemia 01/26/2018  . Anxiety   . Depression   . Depression with anxiety 01/08/2016  . H/O ischemic right MCA stroke   . History of chicken pox 05/11/2017  . Hyperlipidemia   . Hypertension   . Liver disease    hepatitis c  . Right shoulder pain 05/11/2017  . Stroke [facility])     No past surgical history on file.  Family History  Problem Relation Age of Onset  . Heart disease Mother        MI  . Heart attack Mother   . Asthma Mother   . Hyperlipidemia Mother   . Hypertension Mother   . Diabetes Father   . Healthy Maternal Grandmother   . Healthy Maternal Grandfather   . Healthy Paternal Grandmother   . Healthy Paternal Grandfather   . Hyperlipidemia Sister     Social History   Socioeconomic History  . Marital status: Married    Spouse name: Not on file  . Number of children: 2  . Years of education: 71  . Highest education level: Not on file  Occupational History  . Occupation: Teaching laboratory technician  Social Needs  . Financial resource strain: Not on file  . Food insecurity:    Worry: Not on file    Inability: Not on file  . Transportation needs:    Medical: Not on file    Non-medical: Not on file  Tobacco Use  . Smoking status: Former Smoker    Last attempt to quit: 01/26/2015    Years since quitting: 3.5  . Smokeless tobacco: Never Used  Substance and Sexual Activity  . Alcohol use: No    Alcohol/week: 0.0 standard drinks  . Drug use: No  . Sexual activity: Not Currently  Lifestyle  .  Physical activity:    Days per week: Not on file    Minutes per session: Not on file  . Stress: Not on file  Relationships  . Social connections:    Talks on phone: Not on file    Gets together: Not on file    Attends religious service: Not on file    Active member of club or organization: Not on file    Attends meetings of clubs or organizations: Not on file    Relationship status: Not on file  . Intimate partner violence:    Fear of current or ex partner: Not on file    Emotionally abused: Not on file    Physically abused: Not on file    Forced sexual activity: Not on file  Other Topics Concern  . Not on file  Social History Narrative   Fun: Fish, collectable cars/classic cars   Denies religious beliefs effecting health care   Retired from Therapist, occupational   Lives with wife   No dietary restrictions, wears seat belt.     Outpatient Medications Prior to Visit  Medication Sig Dispense Refill  . atorvastatin (LIPITOR) 80 MG tablet Take 1 tablet (80 mg total) by mouth daily.  90 tablet 2  . citalopram (CELEXA) 10 MG tablet TAKE 1 TABLET BY MOUTH ONCE DAILY 90 tablet 2  . clopidogrel (PLAVIX) 75 MG tablet TAKE 1 TABLET BY MOUTH ONCE DAILY 90 tablet 2  . lisinopril-hydrochlorothiazide (PRINZIDE,ZESTORETIC) 20-25 MG tablet TAKE 1 TABLET BY MOUTH ONCE DAILY 90 tablet 1  . buPROPion (WELLBUTRIN XL) 150 MG 24 hr tablet Take 1 tablet (150 mg total) by mouth daily. 7 tablet 0  . buPROPion (WELLBUTRIN XL) 300 MG 24 hr tablet Take 1 tablet (300 mg total) by mouth daily. 30 tablet 5   No facility-administered medications prior to visit.     No Known Allergies  Review of Systems  Constitutional: Negative for fever and malaise/fatigue.  HENT: Negative for congestion.   Eyes: Negative for blurred vision.  Respiratory: Negative for shortness of breath.   Cardiovascular: Negative for chest pain, palpitations and leg swelling.  Gastrointestinal: Negative for abdominal pain, blood  in stool and nausea.  Genitourinary: Negative for dysuria and frequency.  Musculoskeletal: Negative for falls.  Skin: Negative for rash.  Neurological: Negative for dizziness, loss of consciousness and headaches.  Endo/Heme/Allergies: Negative for environmental allergies.  Psychiatric/Behavioral: Negative for depression. The patient is not nervous/anxious.        Objective:    Physical Exam Vitals signs and nursing note reviewed.  Constitutional:      General: He is not in acute distress.    Appearance: He is well-developed.  HENT:     Head: Normocephalic and atraumatic.     Nose: Nose normal.  Eyes:     General:        Right eye: No discharge.        Left eye: No discharge.  Neck:     Musculoskeletal: Normal range of motion and neck supple.  Cardiovascular:     Rate and Rhythm: Normal rate and regular rhythm.     Heart sounds: No murmur.  Pulmonary:     Effort: Pulmonary effort is normal.     Breath sounds: Normal breath sounds.  Abdominal:     General: Bowel sounds are normal.     Palpations: Abdomen is soft.     Tenderness: There is no abdominal tenderness.  Skin:    General: Skin is warm and dry.  Neurological:     Mental Status: He is alert and oriented to person, place, and time.     BP (!) 116/49 (BP Location: Left Arm, Patient Position: Sitting, Cuff Size: Normal)   Pulse 63   Temp 98.1 F (36.7 C) (Oral)   Resp 18   Wt 161 lb 3.2 oz (73.1 kg)   SpO2 99%   BMI 26.02 kg/m  Wt Readings from Last 3 Encounters:  07/26/18 161 lb 3.2 oz (73.1 kg)  01/24/18 160 lb (72.6 kg)  06/04/17 150 lb (68 kg)     Lab Results  Component Value Date   WBC 7.8 07/26/2018   HGB 12.8 (L) 07/26/2018   HCT 38.2 (L) 07/26/2018   PLT 279.0 07/26/2018   GLUCOSE 128 (H) 07/26/2018   CHOL 123 07/26/2018   TRIG 120.0 07/26/2018   HDL 34.60 (L) 07/26/2018   LDLDIRECT 60.0 01/24/2018   LDLCALC 65 07/26/2018   ALT 13 07/26/2018   AST 6 07/26/2018   NA 138 07/26/2018   K  4.3 07/26/2018   CL 104 07/26/2018   CREATININE 1.21 07/26/2018   BUN 19 07/26/2018   CO2 28 07/26/2018   TSH 0.95 07/26/2018   PSA  0.59 08/31/2016   INR 0.96 01/08/2016   HGBA1C 5.7 07/26/2018    Lab Results  Component Value Date   TSH 0.95 07/26/2018   Lab Results  Component Value Date   WBC 7.8 07/26/2018   HGB 12.8 (L) 07/26/2018   HCT 38.2 (L) 07/26/2018   MCV 90.8 07/26/2018   PLT 279.0 07/26/2018   Lab Results  Component Value Date   NA 138 07/26/2018   K 4.3 07/26/2018   CO2 28 07/26/2018   GLUCOSE 128 (H) 07/26/2018   BUN 19 07/26/2018   CREATININE 1.21 07/26/2018   BILITOT 0.4 07/26/2018   ALKPHOS 74 07/26/2018   AST 6 07/26/2018   ALT 13 07/26/2018   PROT 6.6 07/26/2018   ALBUMIN 4.2 07/26/2018   CALCIUM 9.1 07/26/2018   ANIONGAP 11 04/05/2015   GFR 63.70 07/26/2018   Lab Results  Component Value Date   CHOL 123 07/26/2018   Lab Results  Component Value Date   HDL 34.60 (L) 07/26/2018   Lab Results  Component Value Date   LDLCALC 65 07/26/2018   Lab Results  Component Value Date   TRIG 120.0 07/26/2018   Lab Results  Component Value Date   CHOLHDL 4 07/26/2018   Lab Results  Component Value Date   HGBA1C 5.7 07/26/2018       Assessment & Plan:   Problem List Items Addressed This Visit    Essential hypertension    Well controlled, no changes to meds. Encouraged heart healthy diet such as the DASH diet and exercise as tolerated.       Relevant Orders   CBC (Completed)   Comprehensive metabolic panel (Completed)   TSH (Completed)   Tobacco abuse    Still smoking 1 ppd. Encouraged complete cessation. Discussed need to quit as relates to risk of numerous cancers, cardiac and pulmonary disease as well as neurologic complications. Counseled for greater than 3 minutes. He declines assistance presently      HLD (hyperlipidemia)    Encouraged heart healthy diet, increase exercise, avoid trans fats, consider a krill oil cap daily       Relevant Orders   Lipid panel (Completed)   Depression with anxiety    Did not tolerate the Wellbutrin but is doing well on the Citalopram 10 mg daily.      Hyperglycemia    hgba1c acceptable, minimize simple carbs. Increase exercise as tolerated.      Relevant Orders   Hemoglobin A1c (Completed)   Anemia    Increase leafy greens, consider increased lean red meat and using cast iron cookware. Continue to monitor, report any concerns         I have discontinued Juanda Crumble Convey's buPROPion and buPROPion. I am also having him maintain his citalopram, lisinopril-hydrochlorothiazide, clopidogrel, and atorvastatin.  No orders of the defined types were placed in this encounter.    Penni Homans, MD

## 2018-08-30 ENCOUNTER — Ambulatory Visit: Payer: Medicare HMO | Admitting: *Deleted

## 2018-09-26 ENCOUNTER — Other Ambulatory Visit: Payer: Self-pay | Admitting: Family Medicine

## 2018-09-26 DIAGNOSIS — I10 Essential (primary) hypertension: Secondary | ICD-10-CM

## 2018-09-26 NOTE — Telephone Encounter (Signed)
Pt stated they are completely out of lisinopril

## 2018-11-14 ENCOUNTER — Other Ambulatory Visit: Payer: Self-pay | Admitting: Family Medicine

## 2018-11-14 DIAGNOSIS — F411 Generalized anxiety disorder: Secondary | ICD-10-CM

## 2018-11-14 NOTE — Telephone Encounter (Signed)
Requested Prescriptions  Pending Prescriptions Disp Refills  . citalopram (CELEXA) 10 MG tablet [Pharmacy Med Name: Citalopram Hydrobromide 10 MG Oral Tablet] 90 tablet 0    Sig: Take 1 tablet by mouth once daily     Psychiatry:  Antidepressants - SSRI Passed - 11/14/2018  2:16 PM      Passed - Completed PHQ-2 or PHQ-9 in the last 360 days.      Passed - Valid encounter within last 6 months    Recent Outpatient Visits          3 months ago Hyperglycemia   [facility] [place], [place]   9 months ago Routine general medical examination at a health care facility   [facility] [place], [place]   1 year ago Hyperglycemia   [facility] [place], MD   2 years ago Essential hypertension   [place], [place]   2 years ago Essential hypertension   [place], FNP      Future Appointments            In 2 months Mosie Lukes, MD [facility] [place]

## 2019-01-23 ENCOUNTER — Other Ambulatory Visit: Payer: Self-pay | Admitting: Family Medicine

## 2019-01-23 DIAGNOSIS — I6320 Cerebral infarction due to unspecified occlusion or stenosis of unspecified precerebral arteries: Secondary | ICD-10-CM

## 2019-01-26 ENCOUNTER — Encounter: Payer: Medicare Other | Admitting: Family Medicine

## 2019-01-26 ENCOUNTER — Telehealth: Payer: Self-pay | Admitting: Family Medicine

## 2019-01-26 NOTE — Telephone Encounter (Signed)
Patients medication was sent in on 01/24/19 to the pharmacy provided

## 2019-01-26 NOTE — Telephone Encounter (Signed)
Patient came into the office saying that he needs a refill of Plavix sent to [facility] on [address] in [place]. Patient has an appointment scheduled for 02/03/19. Please advise if we can send a refill in since he has an appointment already scheduled.

## 2019-02-03 ENCOUNTER — Ambulatory Visit (INDEPENDENT_AMBULATORY_CARE_PROVIDER_SITE_OTHER): Payer: Medicare HMO | Admitting: Family Medicine

## 2019-02-03 ENCOUNTER — Other Ambulatory Visit: Payer: Self-pay

## 2019-02-03 DIAGNOSIS — R739 Hyperglycemia, unspecified: Secondary | ICD-10-CM

## 2019-02-03 DIAGNOSIS — Z0000 Encounter for general adult medical examination without abnormal findings: Secondary | ICD-10-CM | POA: Diagnosis not present

## 2019-02-03 DIAGNOSIS — I10 Essential (primary) hypertension: Secondary | ICD-10-CM

## 2019-02-03 NOTE — Assessment & Plan Note (Signed)
hgba1c acceptable, minimize simple carbs. Increase exercise as tolerated. Continue current meds 

## 2019-02-05 NOTE — Assessment & Plan Note (Signed)
Encouraged to check vitals weekly and report any concerns.  no changes to meds. Encouraged heart healthy diet such as the DASH diet and exercise as tolerated.  

## 2019-02-05 NOTE — Progress Notes (Signed)
Virtual Visit via phone Note  I connected with [name] on 02/03/19 at  9:20 AM EDT by a phone enabled telemedicine application and verified that I am speaking with the correct person using two identifiers.  Location: Patient: home Provider: home   I discussed the limitations of evaluation and management by telemedicine and the availability of in person appointments. The patient expressed understanding and agreed to proceed. Magdalene Molly, CMA was able to get the patient set up on phone after attempting to set up a video visit and being unsuccessful    Subjective:    Patient ID: Ray Williams, male    DOB: 25-Sep-1950, 68 y.o.   MRN: [id number]  No chief complaint on file.   HPI Patient is in today for annual preventative exam. He feels well. He denies any recent febrile illness or hospitalizations. [place] and has been quarantining well with his family. He stays active and eats a heart healthy diet. He denies any vision or hearing concerns. Denies CP/palp/SOB/HA/congestion/fevers/GI or GU c/o. Taking meds as prescribed. No polyuria or polydipsia.   Past Medical History:  Diagnosis Date  . Anemia 01/26/2018  . Anxiety   . Depression   . Depression with anxiety 01/08/2016  . H/O ischemic right MCA stroke   . History of chicken pox 05/11/2017  . Hyperlipidemia   . Hypertension   . Liver disease    hepatitis c  . Right shoulder pain 05/11/2017  . Stroke [facility])     No past surgical history on file.  Family History  Problem Relation Age of Onset  . Heart disease Mother        MI  . Heart attack Mother   . Asthma Mother   . Hyperlipidemia Mother   . Hypertension Mother   . Diabetes Father   . Healthy Maternal Grandmother   . Healthy Maternal Grandfather   . Healthy Paternal Grandmother   . Healthy Paternal Grandfather   . Hyperlipidemia Sister     Social History   Socioeconomic History  . Marital status: Married    Spouse name: Not on file  . Number  of children: 2  . Years of education: 67  . Highest education level: Not on file  Occupational History  . Occupation: Teaching laboratory technician  Social Needs  . Financial resource strain: Not on file  . Food insecurity    Worry: Not on file    Inability: Not on file  . Transportation needs    Medical: Not on file    Non-medical: Not on file  Tobacco Use  . Smoking status: Former Smoker    Quit date: 01/26/2015    Years since quitting: 4.0  . Smokeless tobacco: Never Used  Substance and Sexual Activity  . Alcohol use: No    Alcohol/week: 0.0 standard drinks  . Drug use: No  . Sexual activity: Not Currently  Lifestyle  . Physical activity    Days per week: Not on file    Minutes per session: Not on file  . Stress: Not on file  Relationships  . Social Herbalist on phone: Not on file    Gets together: Not on file    Attends religious service: Not on file    Active member of club or organization: Not on file    Attends meetings of clubs or organizations: Not on file    Relationship status: Not on file  . Intimate partner violence    Fear of  current or ex partner: Not on file    Emotionally abused: Not on file    Physically abused: Not on file    Forced sexual activity: Not on file  Other Topics Concern  . Not on file  Social History Narrative   Fun: Fish, collectable cars/classic cars   Denies religious beliefs effecting health care   Retired from Therapist, occupational   Lives with wife   No dietary restrictions, wears seat belt.     Outpatient Medications Prior to Visit  Medication Sig Dispense Refill  . atorvastatin (LIPITOR) 80 MG tablet Take 1 tablet (80 mg total) by mouth daily. 90 tablet 2  . citalopram (CELEXA) 10 MG tablet Take 1 tablet by mouth once daily 90 tablet 0  . clopidogrel (PLAVIX) 75 MG tablet Take 1 tablet by mouth once daily 90 tablet 0  . lisinopril-hydrochlorothiazide (PRINZIDE,ZESTORETIC) 20-25 MG tablet TAKE 1 TABLET BY MOUTH ONCE  DAILY 90 tablet 1   No facility-administered medications prior to visit.     No Known Allergies  Review of Systems  Constitutional: Negative for chills, fever and malaise/fatigue.  HENT: Negative for congestion and hearing loss.   Eyes: Negative for discharge.  Respiratory: Negative for cough, sputum production and shortness of breath.   Cardiovascular: Negative for chest pain, palpitations and leg swelling.  Gastrointestinal: Negative for abdominal pain, blood in stool, constipation, diarrhea, heartburn, nausea and vomiting.  Genitourinary: Negative for dysuria, frequency, hematuria and urgency.  Musculoskeletal: Negative for back pain, falls and myalgias.  Skin: Negative for rash.  Neurological: Negative for dizziness, sensory change, loss of consciousness, weakness and headaches.  Endo/Heme/Allergies: Negative for environmental allergies. Does not bruise/bleed easily.  Psychiatric/Behavioral: Negative for depression and suicidal ideas. The patient is not nervous/anxious and does not have insomnia.        Objective:    Physical Exam Unable to obtain via telephone  There were no vitals taken for this visit. Wt Readings from Last 3 Encounters:  07/26/18 161 lb 3.2 oz (73.1 kg)  01/24/18 160 lb (72.6 kg)  06/04/17 150 lb (68 kg)    Diabetic Foot Exam - Simple   No data filed     Lab Results  Component Value Date   WBC 7.8 07/26/2018   HGB 12.8 (L) 07/26/2018   HCT 38.2 (L) 07/26/2018   PLT 279.0 07/26/2018   GLUCOSE 128 (H) 07/26/2018   CHOL 123 07/26/2018   TRIG 120.0 07/26/2018   HDL 34.60 (L) 07/26/2018   LDLDIRECT 60.0 01/24/2018   LDLCALC 65 07/26/2018   ALT 13 07/26/2018   AST 6 07/26/2018   NA 138 07/26/2018   K 4.3 07/26/2018   CL 104 07/26/2018   CREATININE 1.21 07/26/2018   BUN 19 07/26/2018   CO2 28 07/26/2018   TSH 0.95 07/26/2018   PSA 0.59 08/31/2016   INR 0.96 01/08/2016   HGBA1C 5.7 07/26/2018    Lab Results  Component Value Date   TSH  0.95 07/26/2018   Lab Results  Component Value Date   WBC 7.8 07/26/2018   HGB 12.8 (L) 07/26/2018   HCT 38.2 (L) 07/26/2018   MCV 90.8 07/26/2018   PLT 279.0 07/26/2018   Lab Results  Component Value Date   NA 138 07/26/2018   K 4.3 07/26/2018   CO2 28 07/26/2018   GLUCOSE 128 (H) 07/26/2018   BUN 19 07/26/2018   CREATININE 1.21 07/26/2018   BILITOT 0.4 07/26/2018   ALKPHOS 74 07/26/2018   AST 6  07/26/2018   ALT 13 07/26/2018   PROT 6.6 07/26/2018   ALBUMIN 4.2 07/26/2018   CALCIUM 9.1 07/26/2018   ANIONGAP 11 04/05/2015   GFR 63.70 07/26/2018   Lab Results  Component Value Date   CHOL 123 07/26/2018   Lab Results  Component Value Date   HDL 34.60 (L) 07/26/2018   Lab Results  Component Value Date   LDLCALC 65 07/26/2018   Lab Results  Component Value Date   TRIG 120.0 07/26/2018   Lab Results  Component Value Date   CHOLHDL 4 07/26/2018   Lab Results  Component Value Date   HGBA1C 5.7 07/26/2018       Assessment & Plan:   Problem List Items Addressed This Visit    Essential hypertension    Encouraged to check vitals weekly and report any concerns. no changes to meds. Encouraged heart healthy diet such as the DASH diet and exercise as tolerated.       Preventative health care    Patient encouraged to maintain heart healthy diet, regular exercise, adequate sleep. Consider daily probiotics. Take medications as prescribed. Labs reviewed. Counseling regarding quarantine and COVID is given.       Hyperglycemia    hgba1c acceptable, minimize simple carbs. Increase exercise as tolerated. Continue current meds.         I am having [name] maintain his atorvastatin, lisinopril-hydrochlorothiazide, citalopram, and clopidogrel.  No orders of the defined types were placed in this encounter. I discussed the assessment and treatment plan with the patient. The patient was provided an opportunity to ask questions and all were answered. The patient  agreed with the plan and demonstrated an understanding of the instructions.   The patient was advised to call back or seek an in-person evaluation if the symptoms worsen or if the condition fails to improve as anticipated.  I provided 25 minutes of non-face-to-face time during this encounter.   Penni Homans, MD

## 2019-02-05 NOTE — Assessment & Plan Note (Signed)
Patient encouraged to maintain heart healthy diet, regular exercise, adequate sleep. Consider daily probiotics. Take medications as prescribed. Labs reviewed. Counseling regarding quarantine and COVID is given.

## 2019-02-19 ENCOUNTER — Other Ambulatory Visit: Payer: Self-pay | Admitting: Family Medicine

## 2019-02-19 DIAGNOSIS — F411 Generalized anxiety disorder: Secondary | ICD-10-CM

## 2019-02-26 ENCOUNTER — Other Ambulatory Visit: Payer: Self-pay | Admitting: Family Medicine

## 2019-03-19 ENCOUNTER — Other Ambulatory Visit: Payer: Self-pay | Admitting: Family Medicine

## 2019-03-19 DIAGNOSIS — I10 Essential (primary) hypertension: Secondary | ICD-10-CM

## 2019-04-03 ENCOUNTER — Ambulatory Visit: Payer: Self-pay | Admitting: *Deleted

## 2019-04-03 ENCOUNTER — Other Ambulatory Visit: Payer: Self-pay

## 2019-04-03 ENCOUNTER — Encounter: Payer: Self-pay | Admitting: Family Medicine

## 2019-04-03 ENCOUNTER — Ambulatory Visit (INDEPENDENT_AMBULATORY_CARE_PROVIDER_SITE_OTHER): Payer: Medicare HMO | Admitting: Family Medicine

## 2019-04-03 DIAGNOSIS — H5310 Unspecified subjective visual disturbances: Secondary | ICD-10-CM

## 2019-04-03 NOTE — Telephone Encounter (Signed)
Appt scheduled

## 2019-04-03 NOTE — Telephone Encounter (Signed)
Wife calling on behalf of patient. Has been having headache pain behind his eyes on/off for about 2 weeks. Will lie down and take a tylenol eases the headache. No fever/stiff neck/nasal congestion.  Has a cough with rattle that is not new, had since his stroke. No weakness/numbness she reports for him. Does state he complains about his vision sometimes but no blurred or double vision. Has a history of cluster headaches prior to his last stroke. She stated at times she feels like something is different about his face but can't place it due to his previous stroke left him with some deficits on the left side of his face. No travels/No known exposures to [phone].Reviewed care advice with wife Stated she understood.  Transferred for appointment.  Reason for Disposition . [1] MILD-MODERATE headache AND [2] present > 72 hours  Answer Assessment - Initial Assessment Questions 1. LOCATION: "Where does it hurt?"     Behind the eyes 2. ONSET: "When did the headache start?" (Minutes, hours or days)      Two weeks now. Headache is the afternoon. 3. PATTERN: "Does the pain come and go, or has it been constant since it started?"     Comes and goes 4. SEVERITY: "How bad is the pain?" and "What does it keep you from doing?"  (e.g., Scale 1-10; mild, moderate, or severe)   - MILD (1-3): doesn't interfere with normal activities    - MODERATE (4-7): interferes with normal activities or awakens from sleep    - SEVERE (8-10): excruciating pain, unable to do any normal activities       moderate 5. RECURRENT SYMPTOM: "Have you ever had headaches before?" If so, ask: "When was the last time?" and "What happened that time?"      no 6. CAUSE: "What do you think is causing the headache?"     unsure 7. MIGRAINE: "Have you been diagnosed with migraine headaches?" If so, ask: "Is this headache similar?"      no 8. HEAD INJURY: "Has there been any recent injury to the head?"      no 9. OTHER SYMPTOMS: "Do you have any  other symptoms?" (fever, stiff neck, eye pain, sore throat, cold symptoms)     None of these 10. PREGNANCY: "Is there any chance you are pregnant?" "When was your last menstrual period?"       na  Protocols used: HEADACHE-A-AH

## 2019-04-03 NOTE — Progress Notes (Signed)
Chief Complaint  Patient presents with  . Headache    Subjective: Patient is a 68 y.o. male here for R eye pain. Due to COVID-19 pandemic, we are interacting via t. I verified patient's ID using 2 identifiers. Patient agreed to proceed with visit via this method. Patient is at home, I am at office. Patient and I are present for visit.   Over last coiuple weeks, pt has noticed pain behind R eye when he is focusing on something intently. No injury or drainage, He denies headaches or pain otherwise. Has not tried anything at home so far. No vision changes, HA, fevers, allergies. +mild redness in R eye compared to L.   ROS: Eyes: As noted in HPI  Past Medical History:  Diagnosis Date  . Anemia 01/26/2018  . Anxiety   . Depression   . Depression with anxiety 01/08/2016  . H/O ischemic right MCA stroke   . History of chicken pox 05/11/2017  . Hyperlipidemia   . Hypertension   . Liver disease    hepatitis c  . Right shoulder pain 05/11/2017  . Stroke [facility])     Objective: No conversational dyspnea Age appropriate judgment and insight Nml affect and mood  Assessment and Plan: Eye strain, right  Instructed to make appt w eye provider, r/o glaucoma. Does not sound infectious or emergency. Total time spent: 11 min The patient seemed agreeable to plan, but we did cut out and I was unable to reconnect with him. I did iterate again via VM that he is to contact his eye provider this week.   [place], DO 04/03/19  12:56 PM

## 2019-04-16 ENCOUNTER — Other Ambulatory Visit: Payer: Self-pay | Admitting: Family Medicine

## 2019-04-16 DIAGNOSIS — I6320 Cerebral infarction due to unspecified occlusion or stenosis of unspecified precerebral arteries: Secondary | ICD-10-CM

## 2019-05-28 ENCOUNTER — Other Ambulatory Visit: Payer: Self-pay | Admitting: Family Medicine

## 2019-05-28 DIAGNOSIS — F411 Generalized anxiety disorder: Secondary | ICD-10-CM

## 2019-06-18 ENCOUNTER — Other Ambulatory Visit: Payer: Self-pay | Admitting: Family Medicine

## 2019-06-18 DIAGNOSIS — I10 Essential (primary) hypertension: Secondary | ICD-10-CM

## 2019-06-19 ENCOUNTER — Ambulatory Visit: Payer: Medicare HMO | Admitting: Family Medicine

## 2019-06-19 NOTE — Telephone Encounter (Signed)
Patient calling to check status of this request.  

## 2019-06-27 ENCOUNTER — Other Ambulatory Visit: Payer: Self-pay

## 2019-06-27 ENCOUNTER — Ambulatory Visit (INDEPENDENT_AMBULATORY_CARE_PROVIDER_SITE_OTHER): Payer: Medicare HMO | Admitting: Family Medicine

## 2019-06-27 DIAGNOSIS — E782 Mixed hyperlipidemia: Secondary | ICD-10-CM | POA: Diagnosis not present

## 2019-06-27 DIAGNOSIS — I10 Essential (primary) hypertension: Secondary | ICD-10-CM | POA: Diagnosis not present

## 2019-06-27 DIAGNOSIS — D649 Anemia, unspecified: Secondary | ICD-10-CM | POA: Diagnosis not present

## 2019-06-27 DIAGNOSIS — R739 Hyperglycemia, unspecified: Secondary | ICD-10-CM | POA: Diagnosis not present

## 2019-06-27 DIAGNOSIS — R69 Illness, unspecified: Secondary | ICD-10-CM | POA: Diagnosis not present

## 2019-06-27 DIAGNOSIS — F418 Other specified anxiety disorders: Secondary | ICD-10-CM

## 2019-06-28 NOTE — Assessment & Plan Note (Signed)
Encouraged heart healthy diet, increase exercise, avoid trans fats, consider a krill oil cap daily tolerating Atorvastatin

## 2019-06-28 NOTE — Assessment & Plan Note (Signed)
Well controlled, no changes to meds. Encouraged heart healthy diet such as the DASH diet and exercise as tolerated.  °

## 2019-06-28 NOTE — Assessment & Plan Note (Signed)
hgba1c acceptable, minimize simple carbs. Increase exercise as tolerated.  

## 2019-06-28 NOTE — Progress Notes (Addendum)
Virtual Visit via phoe Note  I connected with Ray Williams on 06/27/19 at  3:00 PM EST by a phone enabled telemedicine application and verified that I am speaking with the correct person using two identifiers.  Location: Patient: home Provider: office   I discussed the limitations of evaluation and management by telemedicine and the availability of in person appointments. The patient expressed understanding and agreed to proceed. Princess Eulas Post, cma was able to get the patient set up on a phone visit after being unable to get on video visit   Subjective:    Patient ID: Ray Williams, male    DOB: March 30, 1951, 68 y.o.   MRN: [id number]  No chief complaint on file.   HPI Patient is in today for follow up on chronic medical concerns including hypertension, hyperlipidemia, and anemia. No recent febrile illness or hospitalizations. He is maintaining quarantine well and has not concerns. Is trying to stay active and eat well. Denies CP/palp/SOB/HA/congestion/fevers/GI or GU c/o. Taking meds as prescribed  Past Medical History:  Diagnosis Date  . Anemia 01/26/2018  . Anxiety   . Depression   . Depression with anxiety 01/08/2016  . H/O ischemic right MCA stroke   . History of chicken pox 05/11/2017  . Hyperlipidemia   . Hypertension   . Liver disease    hepatitis c  . Right shoulder pain 05/11/2017  . Stroke [facility])     No past surgical history on file.  Family History  Problem Relation Age of Onset  . Heart disease Mother        MI  . Heart attack Mother   . Asthma Mother   . Hyperlipidemia Mother   . Hypertension Mother   . Diabetes Father   . Healthy Maternal Grandmother   . Healthy Maternal Grandfather   . Healthy Paternal Grandmother   . Healthy Paternal Grandfather   . Hyperlipidemia Sister     Social History   Socioeconomic History  . Marital status: Married    Spouse name: Not on file  . Number of children: 2  . Years of education: 15  . Highest education  level: Not on file  Occupational History  . Occupation: Teaching laboratory technician  Social Needs  . Financial resource strain: Not on file  . Food insecurity    Worry: Not on file    Inability: Not on file  . Transportation needs    Medical: Not on file    Non-medical: Not on file  Tobacco Use  . Smoking status: Former Smoker    Quit date: 01/26/2015    Years since quitting: 4.4  . Smokeless tobacco: Never Used  Substance and Sexual Activity  . Alcohol use: No    Alcohol/week: 0.0 standard drinks  . Drug use: No  . Sexual activity: Not Currently  Lifestyle  . Physical activity    Days per week: Not on file    Minutes per session: Not on file  . Stress: Not on file  Relationships  . Social Herbalist on phone: Not on file    Gets together: Not on file    Attends religious service: Not on file    Active member of club or organization: Not on file    Attends meetings of clubs or organizations: Not on file    Relationship status: Not on file  . Intimate partner violence    Fear of current or ex partner: Not on file    Emotionally abused: Not on file  Physically abused: Not on file    Forced sexual activity: Not on file  Other Topics Concern  . Not on file  Social History Narrative   Fun: Fish, collectable cars/classic cars   Denies religious beliefs effecting health care   Retired from Therapist, occupational   Lives with wife   No dietary restrictions, wears seat belt.     Outpatient Medications Prior to Visit  Medication Sig Dispense Refill  . atorvastatin (LIPITOR) 80 MG tablet Take 1 tablet by mouth once daily 90 tablet 0  . citalopram (CELEXA) 10 MG tablet Take 1 tablet by mouth once daily 90 tablet 0  . clopidogrel (PLAVIX) 75 MG tablet Take 1 tablet by mouth once daily 90 tablet 0  . lisinopril-hydrochlorothiazide (ZESTORETIC) 20-25 MG tablet Take 1 tablet by mouth once daily 90 tablet 1   No facility-administered medications prior to visit.     No  Known Allergies  Review of Systems  Constitutional: Negative for fever and malaise/fatigue.  HENT: Negative for congestion.   Eyes: Negative for blurred vision.  Respiratory: Negative for shortness of breath.   Cardiovascular: Negative for chest pain, palpitations and leg swelling.  Gastrointestinal: Negative for abdominal pain, blood in stool and nausea.  Genitourinary: Negative for dysuria and frequency.  Musculoskeletal: Negative for falls.  Skin: Negative for rash.  Neurological: Negative for dizziness, loss of consciousness and headaches.  Endo/Heme/Allergies: Negative for environmental allergies.  Psychiatric/Behavioral: Negative for depression. The patient is not nervous/anxious.        Objective:    Physical Exam unable to obtain via phone visit  There were no vitals taken for this visit. Wt Readings from Last 3 Encounters:  07/26/18 161 lb 3.2 oz (73.1 kg)  01/24/18 160 lb (72.6 kg)  06/04/17 150 lb (68 kg)    Diabetic Foot Exam - Simple   No data filed     Lab Results  Component Value Date   WBC 7.8 07/26/2018   HGB 12.8 (L) 07/26/2018   HCT 38.2 (L) 07/26/2018   PLT 279.0 07/26/2018   GLUCOSE 128 (H) 07/26/2018   CHOL 123 07/26/2018   TRIG 120.0 07/26/2018   HDL 34.60 (L) 07/26/2018   LDLDIRECT 60.0 01/24/2018   LDLCALC 65 07/26/2018   ALT 13 07/26/2018   AST 6 07/26/2018   NA 138 07/26/2018   K 4.3 07/26/2018   CL 104 07/26/2018   CREATININE 1.21 07/26/2018   BUN 19 07/26/2018   CO2 28 07/26/2018   TSH 0.95 07/26/2018   PSA 0.59 08/31/2016   INR 0.96 01/08/2016   HGBA1C 5.7 07/26/2018    Lab Results  Component Value Date   TSH 0.95 07/26/2018   Lab Results  Component Value Date   WBC 7.8 07/26/2018   HGB 12.8 (L) 07/26/2018   HCT 38.2 (L) 07/26/2018   MCV 90.8 07/26/2018   PLT 279.0 07/26/2018   Lab Results  Component Value Date   NA 138 07/26/2018   K 4.3 07/26/2018   CO2 28 07/26/2018   GLUCOSE 128 (H) 07/26/2018   BUN 19  07/26/2018   CREATININE 1.21 07/26/2018   BILITOT 0.4 07/26/2018   ALKPHOS 74 07/26/2018   AST 6 07/26/2018   ALT 13 07/26/2018   PROT 6.6 07/26/2018   ALBUMIN 4.2 07/26/2018   CALCIUM 9.1 07/26/2018   ANIONGAP 11 04/05/2015   GFR 63.70 07/26/2018   Lab Results  Component Value Date   CHOL 123 07/26/2018   Lab Results  Component Value  Date   HDL 34.60 (L) 07/26/2018   Lab Results  Component Value Date   LDLCALC 65 07/26/2018   Lab Results  Component Value Date   TRIG 120.0 07/26/2018   Lab Results  Component Value Date   CHOLHDL 4 07/26/2018   Lab Results  Component Value Date   HGBA1C 5.7 07/26/2018       Assessment & Plan:   Problem List Items Addressed This Visit    Essential hypertension    Well controlled, no changes to meds. Encouraged heart healthy diet such as the DASH diet and exercise as tolerated.       Relevant Orders   CMP   HLD (hyperlipidemia)    Encouraged heart healthy diet, increase exercise, avoid trans fats, consider a krill oil cap daily tolerating Atorvastatin      Relevant Orders   Lipid panel   Depression with anxiety   Relevant Orders   TSH   Hyperglycemia - Primary    hgba1c acceptable, minimize simple carbs. Increase exercise as tolerated.       Relevant Orders   A1C   Anemia    Intake leafy greens, consider increased lean red meat and using cast iron cookware. Continue to monitor, report any concerns      Relevant Orders   CBC      I am having Ray Williams maintain his clopidogrel, atorvastatin, citalopram, and lisinopril-hydrochlorothiazide.  No orders of the defined types were placed in this encounter.    I discussed the assessment and treatment plan with the patient. The patient was provided an opportunity to ask questions and all were answered. The patient agreed with the plan and demonstrated an understanding of the instructions.   The patient was advised to call back or seek an in-person evaluation if  the symptoms worsen or if the condition fails to improve as anticipated.  I provided 25 minutes of non-face-to-face time during this encounter.   Penni Homans, MD

## 2019-06-28 NOTE — Assessment & Plan Note (Signed)
Intake leafy greens, consider increased lean red meat and using cast iron cookware. Continue to monitor, report any concerns

## 2019-07-10 ENCOUNTER — Telehealth: Payer: Self-pay | Admitting: Family Medicine

## 2019-07-10 NOTE — Telephone Encounter (Signed)
LM for pt to call and scheduling fast lab appt this week sometime

## 2019-07-12 ENCOUNTER — Other Ambulatory Visit (INDEPENDENT_AMBULATORY_CARE_PROVIDER_SITE_OTHER): Payer: Medicare HMO

## 2019-07-12 ENCOUNTER — Other Ambulatory Visit: Payer: Self-pay

## 2019-07-12 DIAGNOSIS — D649 Anemia, unspecified: Secondary | ICD-10-CM | POA: Diagnosis not present

## 2019-07-12 DIAGNOSIS — E782 Mixed hyperlipidemia: Secondary | ICD-10-CM

## 2019-07-12 DIAGNOSIS — R739 Hyperglycemia, unspecified: Secondary | ICD-10-CM | POA: Diagnosis not present

## 2019-07-12 DIAGNOSIS — F418 Other specified anxiety disorders: Secondary | ICD-10-CM | POA: Diagnosis not present

## 2019-07-12 DIAGNOSIS — I10 Essential (primary) hypertension: Secondary | ICD-10-CM | POA: Diagnosis not present

## 2019-07-12 DIAGNOSIS — R69 Illness, unspecified: Secondary | ICD-10-CM | POA: Diagnosis not present

## 2019-07-12 LAB — COMPREHENSIVE METABOLIC PANEL
ALT: 16 U/L (ref 0–53)
AST: 8 U/L (ref 0–37)
Albumin: 4.4 g/dL (ref 3.5–5.2)
Alkaline Phosphatase: 91 U/L (ref 39–117)
BUN: 24 mg/dL — ABNORMAL HIGH (ref 6–23)
CO2: 26 mEq/L (ref 19–32)
Calcium: 9.5 mg/dL (ref 8.4–10.5)
Chloride: 106 mEq/L (ref 96–112)
Creatinine, Ser: 1.28 mg/dL (ref 0.40–1.50)
GFR: 56.01 mL/min — ABNORMAL LOW (ref >60.00)
Glucose, Bld: 105 mg/dL — ABNORMAL HIGH (ref 70–99)
Potassium: 5.7 mEq/L — ABNORMAL HIGH (ref 3.5–5.1)
Sodium: 140 mEq/L (ref 135–145)
Total Bilirubin: 0.6 mg/dL (ref 0.2–1.2)
Total Protein: 7 g/dL (ref 6.0–8.3)

## 2019-07-12 LAB — CBC
HCT: 38.3 % — ABNORMAL LOW (ref 39.0–52.0)
Hemoglobin: 12.6 g/dL — ABNORMAL LOW (ref 13.0–17.0)
MCHC: 33 g/dL (ref 30.0–36.0)
MCV: 91.2 fl (ref 78.0–100.0)
Platelets: 256 10*3/uL (ref 150.0–400.0)
RBC: 4.2 Mil/uL — ABNORMAL LOW (ref 4.22–5.81)
RDW: 13.7 % (ref 11.5–15.5)
WBC: 8.2 10*3/uL (ref 4.0–10.5)

## 2019-07-12 LAB — TSH: TSH: 1.42 u[IU]/mL (ref 0.35–4.50)

## 2019-07-12 LAB — LIPID PANEL
Cholesterol: 125 mg/dL (ref 0–200)
HDL: 32.5 mg/dL — ABNORMAL LOW (ref >39.00)
LDL Cholesterol: 74 mg/dL (ref 0–99)
NonHDL: 92.8
Total CHOL/HDL Ratio: 4
Triglycerides: 94 mg/dL (ref 0.0–149.0)
VLDL: 18.8 mg/dL (ref 0.0–40.0)

## 2019-07-12 LAB — HEMOGLOBIN A1C: Hgb A1c MFr Bld: 5.8 % (ref 4.6–6.5)

## 2019-07-18 ENCOUNTER — Other Ambulatory Visit: Payer: Self-pay | Admitting: Family Medicine

## 2019-07-18 DIAGNOSIS — I6320 Cerebral infarction due to unspecified occlusion or stenosis of unspecified precerebral arteries: Secondary | ICD-10-CM

## 2019-07-18 NOTE — Telephone Encounter (Signed)
Pt is out of the medication. Please advise.

## 2019-08-11 HISTORY — PX: EYE SURGERY: SHX253

## 2019-08-27 ENCOUNTER — Other Ambulatory Visit: Payer: Self-pay | Admitting: Family Medicine

## 2019-08-27 DIAGNOSIS — F411 Generalized anxiety disorder: Secondary | ICD-10-CM

## 2019-10-15 ENCOUNTER — Other Ambulatory Visit: Payer: Self-pay | Admitting: Family Medicine

## 2019-10-15 DIAGNOSIS — I6320 Cerebral infarction due to unspecified occlusion or stenosis of unspecified precerebral arteries: Secondary | ICD-10-CM

## 2019-11-13 ENCOUNTER — Other Ambulatory Visit: Payer: Self-pay

## 2019-12-03 ENCOUNTER — Other Ambulatory Visit: Payer: Self-pay | Admitting: Family Medicine

## 2019-12-03 DIAGNOSIS — F411 Generalized anxiety disorder: Secondary | ICD-10-CM

## 2019-12-17 ENCOUNTER — Other Ambulatory Visit: Payer: Self-pay | Admitting: Family Medicine

## 2019-12-17 DIAGNOSIS — I10 Essential (primary) hypertension: Secondary | ICD-10-CM

## 2019-12-18 ENCOUNTER — Telehealth: Payer: Self-pay | Admitting: Family Medicine

## 2019-12-18 NOTE — Telephone Encounter (Signed)
Left detailed message on machine that medication has been sent in and also he needs to call back to schedule a physical.  Did advised it will be a little ways out but sent in enough medication to cover until he get scheduled.

## 2019-12-18 NOTE — Telephone Encounter (Signed)
Medication: lisinopril-hydrochlorothiazide (ZESTORETIC) 20-25 MG  Patient is out of  Has the patient contacted their pharmacy? Yes.   (If no, request that the patient contact the pharmacy for the refill.) (If yes, when and what did the pharmacy advise?) They had to talk to doctor  Preferred Pharmacy (with phone number or street name):   [facility] [address], [place], [place] [address]  Phone:  [phone] Fax:  [phone]   Agent: Please be advised that RX refills may take up to 3 business days. We ask that you follow-up with your pharmacy.

## 2019-12-19 ENCOUNTER — Ambulatory Visit: Payer: Medicare HMO | Admitting: Family Medicine

## 2019-12-22 ENCOUNTER — Other Ambulatory Visit: Payer: Self-pay

## 2019-12-22 ENCOUNTER — Telehealth (INDEPENDENT_AMBULATORY_CARE_PROVIDER_SITE_OTHER): Payer: Medicare HMO | Admitting: Family Medicine

## 2019-12-22 DIAGNOSIS — I10 Essential (primary) hypertension: Secondary | ICD-10-CM

## 2019-12-22 DIAGNOSIS — Z720 Tobacco use: Secondary | ICD-10-CM | POA: Diagnosis not present

## 2019-12-22 DIAGNOSIS — E782 Mixed hyperlipidemia: Secondary | ICD-10-CM

## 2019-12-22 DIAGNOSIS — R739 Hyperglycemia, unspecified: Secondary | ICD-10-CM | POA: Diagnosis not present

## 2019-12-22 NOTE — Assessment & Plan Note (Addendum)
Has cut down to less than 1/2 a pack a day. Is trying quit as he has a new great grandbaby. He is encouraged to attempt to develop new patterns so he is not triggered to crave a cigarette

## 2019-12-22 NOTE — Assessment & Plan Note (Signed)
Monitor and report any concerns. no changes to meds. Encouraged heart healthy diet such as the DASH diet and exercise as tolerated.  

## 2019-12-22 NOTE — Assessment & Plan Note (Signed)
Tolerating statin, encouraged heart healthy diet, avoid trans fats, minimize simple carbs and saturated fats. Increase exercise as tolerated 

## 2019-12-24 NOTE — Progress Notes (Signed)
Virtual Visit via phone Note  I connected with Ray Williams on 12/22/19 at  8:20 AM EDT by a phone enabled telemedicine application and verified that I am speaking with the correct person using two identifiers.  Location: Patient: home, patient in visit Provider: home, physician in visit   I discussed the limitations of evaluation and management by telemedicine and the availability of in person appointments. The patient expressed understanding and agreed to proceed. Kem Boroughs, CMA was able to get the patient set up on a visit, phone after being unable to set up a video visit   Subjective:    Patient ID: Ray Williams, male    DOB: Nov 17, 1950, 69 y.o.   MRN: [id number]  No chief complaint on file.   HPI Patient is in today for follow up on chronic medical concerns. No recent febrile illness or hospitalizations. No polyuria or polydipsia. He is trying to stay active and maintain a heart healthy diet. He has been cutting down on cigarette smoking since he had a new great grandchild. He had both of his [place] shots last one on 12/14/2019 at [facility]. He tolerated well. He is working at a salvage yard and enjoys the work. Denies CP/palp/SOB/HA/congestion/fevers/GI or GU c/o. Taking meds as prescribed  Past Medical History:  Diagnosis Date  . Anemia 01/26/2018  . Anxiety   . Depression   . Depression with anxiety 01/08/2016  . H/O ischemic right MCA stroke   . History of chicken pox 05/11/2017  . Hyperlipidemia   . Hypertension   . Liver disease    hepatitis c  . Right shoulder pain 05/11/2017  . Stroke [facility])     No past surgical history on file.  Family History  Problem Relation Age of Onset  . Heart disease Mother        MI  . Heart attack Mother   . Asthma Mother   . Hyperlipidemia Mother   . Hypertension Mother   . Diabetes Father   . Healthy Maternal Grandmother   . Healthy Maternal Grandfather   . Healthy Paternal Grandmother   . Healthy Paternal  Grandfather   . Hyperlipidemia Sister     Social History   Socioeconomic History  . Marital status: Married    Spouse name: Not on file  . Number of children: 2  . Years of education: 68  . Highest education level: Not on file  Occupational History  . Occupation: Grading Contractor  Tobacco Use  . Smoking status: Former Smoker    Quit date: 01/26/2015    Years since quitting: 4.9  . Smokeless tobacco: Never Used  Substance and Sexual Activity  . Alcohol use: No    Alcohol/week: 0.0 standard drinks  . Drug use: No  . Sexual activity: Not Currently  Other Topics Concern  . Not on file  Social History Narrative   Fun: Fish, collectable cars/classic cars   Denies religious beliefs effecting health care   Retired from Therapist, occupational   Lives with wife   No dietary restrictions, wears seat belt.    Social Determinants of Health   Financial Resource Strain:   . Difficulty of Paying Living Expenses:   Food Insecurity:   . Worried About Charity fundraiser in the Last Year:   . Arboriculturist in the Last Year:   Transportation Needs:   . Film/video editor (Medical):   Marland Kitchen Lack of Transportation (Non-Medical):   Physical Activity:   . Days of  Exercise per Week:   . Minutes of Exercise per Session:   Stress:   . Feeling of Stress :   Social Connections:   . Frequency of Communication with Friends and Family:   . Frequency of Social Gatherings with Friends and Family:   . Attends Religious Services:   . Active Member of Clubs or Organizations:   . Attends Archivist Meetings:   Marland Kitchen Marital Status:   Intimate Partner Violence:   . Fear of Current or Ex-Partner:   . Emotionally Abused:   Marland Kitchen Physically Abused:   . Sexually Abused:     Outpatient Medications Prior to Visit  Medication Sig Dispense Refill  . atorvastatin (LIPITOR) 80 MG tablet Take 1 tablet by mouth once daily 90 tablet 1  . citalopram (CELEXA) 10 MG tablet Take 1 tablet by mouth  once daily 90 tablet 1  . clopidogrel (PLAVIX) 75 MG tablet Take 1 tablet by mouth once daily 90 tablet 1  . lisinopril-hydrochlorothiazide (ZESTORETIC) 20-25 MG tablet Take 1 tablet by mouth once daily 90 tablet 1   No facility-administered medications prior to visit.    No Known Allergies  Review of Systems  Constitutional: Negative for fever and malaise/fatigue.  HENT: Negative for congestion.   Eyes: Negative for blurred vision.  Respiratory: Negative for shortness of breath.   Cardiovascular: Negative for chest pain, palpitations and leg swelling.  Gastrointestinal: Negative for abdominal pain, blood in stool and nausea.  Genitourinary: Negative for dysuria and frequency.  Musculoskeletal: Negative for falls.  Skin: Negative for rash.  Neurological: Negative for dizziness, loss of consciousness and headaches.  Endo/Heme/Allergies: Negative for environmental allergies.  Psychiatric/Behavioral: Negative for depression. The patient is not nervous/anxious.        Objective:    Physical Exam unable to obtain via phone visit  There were no vitals taken for this visit. Wt Readings from Last 3 Encounters:  07/26/18 161 lb 3.2 oz (73.1 kg)  01/24/18 160 lb (72.6 kg)  06/04/17 150 lb (68 kg)    Diabetic Foot Exam - Simple   No data filed     Lab Results  Component Value Date   WBC 8.2 07/12/2019   HGB 12.6 (L) 07/12/2019   HCT 38.3 (L) 07/12/2019   PLT 256.0 07/12/2019   GLUCOSE 105 (H) 07/12/2019   CHOL 125 07/12/2019   TRIG 94.0 07/12/2019   HDL 32.50 (L) 07/12/2019   LDLDIRECT 60.0 01/24/2018   LDLCALC 74 07/12/2019   ALT 16 07/12/2019   AST 8 07/12/2019   NA 140 07/12/2019   K 5.7 No hemolysis seen (H) 07/12/2019   CL 106 07/12/2019   CREATININE 1.28 07/12/2019   BUN 24 (H) 07/12/2019   CO2 26 07/12/2019   TSH 1.42 07/12/2019   PSA 0.59 08/31/2016   INR 0.96 01/08/2016   HGBA1C 5.8 07/12/2019    Lab Results  Component Value Date   TSH 1.42 07/12/2019    Lab Results  Component Value Date   WBC 8.2 07/12/2019   HGB 12.6 (L) 07/12/2019   HCT 38.3 (L) 07/12/2019   MCV 91.2 07/12/2019   PLT 256.0 07/12/2019   Lab Results  Component Value Date   NA 140 07/12/2019   K 5.7 No hemolysis seen (H) 07/12/2019   CO2 26 07/12/2019   GLUCOSE 105 (H) 07/12/2019   BUN 24 (H) 07/12/2019   CREATININE 1.28 07/12/2019   BILITOT 0.6 07/12/2019   ALKPHOS 91 07/12/2019   AST 8 07/12/2019  ALT 16 07/12/2019   PROT 7.0 07/12/2019   ALBUMIN 4.4 07/12/2019   CALCIUM 9.5 07/12/2019   ANIONGAP 11 04/05/2015   GFR 56.01 (L) 07/12/2019   Lab Results  Component Value Date   CHOL 125 07/12/2019   Lab Results  Component Value Date   HDL 32.50 (L) 07/12/2019   Lab Results  Component Value Date   LDLCALC 74 07/12/2019   Lab Results  Component Value Date   TRIG 94.0 07/12/2019   Lab Results  Component Value Date   CHOLHDL 4 07/12/2019   Lab Results  Component Value Date   HGBA1C 5.8 07/12/2019       Assessment & Plan:   Problem List Items Addressed This Visit    Essential hypertension    Monitor and report any concerns no changes to meds. Encouraged heart healthy diet such as the DASH diet and exercise as tolerated.       Relevant Orders   CBC   Comprehensive metabolic panel   TSH   Tobacco abuse    Has cut down to less than 1/2 a pack a day. Is trying quit as he has a new great grandbaby. He is encouraged to attempt to develop new patterns so he is not triggered to crave a cigarette      HLD (hyperlipidemia) - Primary    Tolerating statin, encouraged heart healthy diet, avoid trans fats, minimize simple carbs and saturated fats. Increase exercise as tolerated      Relevant Orders   Lipid panel   Hyperglycemia    hgba1c acceptable, minimize simple carbs. Increase exercise as tolerated.       Relevant Orders   Hemoglobin A1c      I am having Ray Williams maintain his clopidogrel, citalopram, atorvastatin, and  lisinopril-hydrochlorothiazide.  No orders of the defined types were placed in this encounter.    I discussed the assessment and treatment plan with the patient. The patient was provided an opportunity to ask questions and all were answered. The patient agreed with the plan and demonstrated an understanding of the instructions.   The patient was advised to call back or seek an in-person evaluation if the symptoms worsen or if the condition fails to improve as anticipated.  I provided 20 minutes of non-face-to-face time during this encounter.   Penni Homans, MD

## 2019-12-24 NOTE — Assessment & Plan Note (Signed)
hgba1c acceptable, minimize simple carbs. Increase exercise as tolerated.  

## 2020-01-05 ENCOUNTER — Other Ambulatory Visit: Payer: Medicare HMO

## 2020-01-19 DIAGNOSIS — H2513 Age-related nuclear cataract, bilateral: Secondary | ICD-10-CM | POA: Diagnosis not present

## 2020-01-19 DIAGNOSIS — H43393 Other vitreous opacities, bilateral: Secondary | ICD-10-CM | POA: Diagnosis not present

## 2020-01-19 DIAGNOSIS — H524 Presbyopia: Secondary | ICD-10-CM | POA: Diagnosis not present

## 2020-01-19 DIAGNOSIS — H3563 Retinal hemorrhage, bilateral: Secondary | ICD-10-CM | POA: Diagnosis not present

## 2020-01-19 DIAGNOSIS — H11153 Pinguecula, bilateral: Secondary | ICD-10-CM | POA: Diagnosis not present

## 2020-01-19 DIAGNOSIS — H40013 Open angle with borderline findings, low risk, bilateral: Secondary | ICD-10-CM | POA: Diagnosis not present

## 2020-03-11 ENCOUNTER — Other Ambulatory Visit: Payer: Self-pay | Admitting: Family Medicine

## 2020-03-11 DIAGNOSIS — I6320 Cerebral infarction due to unspecified occlusion or stenosis of unspecified precerebral arteries: Secondary | ICD-10-CM

## 2020-04-14 ENCOUNTER — Other Ambulatory Visit: Payer: Self-pay | Admitting: Family Medicine

## 2020-04-14 DIAGNOSIS — I6320 Cerebral infarction due to unspecified occlusion or stenosis of unspecified precerebral arteries: Secondary | ICD-10-CM

## 2020-04-16 ENCOUNTER — Telehealth: Payer: Self-pay

## 2020-04-16 NOTE — Telephone Encounter (Signed)
Patient's wife has been notified Rx has been sent to pharmacy.

## 2020-04-16 NOTE — Telephone Encounter (Signed)
Patient wife notified that rx was sent in.

## 2020-04-16 NOTE — Telephone Encounter (Signed)
Caller states that her husband is out of his medication and needs a refill. Additional Comment Declined triage. Provided office hours

## 2020-04-16 NOTE — Telephone Encounter (Signed)
Medication: clopidogrel (PLAVIX) 75 MG tablet [[id number]  Has the patient contacted their pharmacy? No. (If no, request that the patient contact the pharmacy for the refill.) (If yes, when and what did the pharmacy advise?)  Preferred Pharmacy (with phone number or street name): [facility] [address] [place], [place] [address]  Phone:  [phone] Fax:  [phone]  DEA #:  [id number]  Agent: Please be advised that RX refills may take up to 3 business days. We ask that you follow-up with your pharmacy.

## 2020-04-25 DIAGNOSIS — H2511 Age-related nuclear cataract, right eye: Secondary | ICD-10-CM | POA: Diagnosis not present

## 2020-04-25 DIAGNOSIS — H2513 Age-related nuclear cataract, bilateral: Secondary | ICD-10-CM | POA: Diagnosis not present

## 2020-04-25 DIAGNOSIS — H40013 Open angle with borderline findings, low risk, bilateral: Secondary | ICD-10-CM | POA: Diagnosis not present

## 2020-04-25 DIAGNOSIS — H25043 Posterior subcapsular polar age-related cataract, bilateral: Secondary | ICD-10-CM | POA: Diagnosis not present

## 2020-04-25 DIAGNOSIS — H18413 Arcus senilis, bilateral: Secondary | ICD-10-CM | POA: Diagnosis not present

## 2020-04-30 ENCOUNTER — Ambulatory Visit: Payer: Medicare HMO | Admitting: Family Medicine

## 2020-05-01 DIAGNOSIS — H2511 Age-related nuclear cataract, right eye: Secondary | ICD-10-CM | POA: Diagnosis not present

## 2020-05-02 DIAGNOSIS — H2512 Age-related nuclear cataract, left eye: Secondary | ICD-10-CM | POA: Diagnosis not present

## 2020-05-22 DIAGNOSIS — H2512 Age-related nuclear cataract, left eye: Secondary | ICD-10-CM | POA: Diagnosis not present

## 2020-05-27 ENCOUNTER — Other Ambulatory Visit: Payer: Self-pay | Admitting: Family Medicine

## 2020-05-27 DIAGNOSIS — F411 Generalized anxiety disorder: Secondary | ICD-10-CM

## 2020-05-27 NOTE — Telephone Encounter (Signed)
Medication: atorvastatin (LIPITOR) 80 MG tablet [[id number]]   citalopram (CELEXA) 10 MG tablet [[id number]]    Has the patient contacted their pharmacy? No. (If no, request that the patient contact the pharmacy for the refill.) (If yes, when and what did the pharmacy advise?)  Preferred Pharmacy (with phone number or street name): [facility] [address], [place], [place] [address]  Phone:  [phone] Fax:  [phone]  DEA #:  [id number]  Agent: Please be advised that RX refills may take up to 3 business days. We ask that you follow-up with your pharmacy.

## 2020-06-16 ENCOUNTER — Other Ambulatory Visit: Payer: Self-pay | Admitting: Family Medicine

## 2020-06-16 DIAGNOSIS — I10 Essential (primary) hypertension: Secondary | ICD-10-CM

## 2020-06-17 NOTE — Telephone Encounter (Signed)
lisinopril-hydrochlorothiazide (ZESTORETIC) 20-25 MG tablet  Wife called about meds again. States he is out and wanted this filled today .

## 2020-08-10 HISTORY — PX: CATARACT EXTRACTION, BILATERAL: SHX1313

## 2020-08-18 ENCOUNTER — Other Ambulatory Visit: Payer: Self-pay | Admitting: Family Medicine

## 2020-08-18 DIAGNOSIS — F411 Generalized anxiety disorder: Secondary | ICD-10-CM

## 2020-08-22 ENCOUNTER — Ambulatory Visit (INDEPENDENT_AMBULATORY_CARE_PROVIDER_SITE_OTHER): Payer: Medicare HMO | Admitting: Family Medicine

## 2020-08-22 ENCOUNTER — Other Ambulatory Visit: Payer: Self-pay

## 2020-08-22 ENCOUNTER — Encounter: Payer: Self-pay | Admitting: Family Medicine

## 2020-08-22 DIAGNOSIS — R739 Hyperglycemia, unspecified: Secondary | ICD-10-CM

## 2020-08-22 DIAGNOSIS — E782 Mixed hyperlipidemia: Secondary | ICD-10-CM | POA: Diagnosis not present

## 2020-08-22 DIAGNOSIS — I10 Essential (primary) hypertension: Secondary | ICD-10-CM | POA: Diagnosis not present

## 2020-08-22 DIAGNOSIS — Z720 Tobacco use: Secondary | ICD-10-CM | POA: Diagnosis not present

## 2020-08-22 LAB — COMPREHENSIVE METABOLIC PANEL
ALT: 14 U/L (ref 0–53)
AST: 9 U/L (ref 0–37)
Albumin: 4.4 g/dL (ref 3.5–5.2)
Alkaline Phosphatase: 84 U/L (ref 39–117)
BUN: 18 mg/dL (ref 6–23)
CO2: 28 mEq/L (ref 19–32)
Calcium: 9.4 mg/dL (ref 8.4–10.5)
Chloride: 103 mEq/L (ref 96–112)
Creatinine, Ser: 1.23 mg/dL (ref 0.40–1.50)
GFR: 60.37 mL/min (ref >60.00)
Glucose, Bld: 96 mg/dL (ref 70–99)
Potassium: 4.6 mEq/L (ref 3.5–5.1)
Sodium: 136 mEq/L (ref 135–145)
Total Bilirubin: 0.5 mg/dL (ref 0.2–1.2)
Total Protein: 7 g/dL (ref 6.0–8.3)

## 2020-08-22 LAB — CBC
HCT: 39 % (ref 39.0–52.0)
Hemoglobin: 13.1 g/dL (ref 13.0–17.0)
MCHC: 33.7 g/dL (ref 30.0–36.0)
MCV: 88.3 fl (ref 78.0–100.0)
Platelets: 259 10*3/uL (ref 150.0–400.0)
RBC: 4.41 Mil/uL (ref 4.22–5.81)
RDW: 13.8 % (ref 11.5–15.5)
WBC: 5.6 10*3/uL (ref 4.0–10.5)

## 2020-08-22 LAB — HEMOGLOBIN A1C: Hgb A1c MFr Bld: 6 % (ref 4.6–6.5)

## 2020-08-22 LAB — LIPID PANEL
Cholesterol: 118 mg/dL (ref 0–200)
HDL: 32.4 mg/dL — ABNORMAL LOW (ref >39.00)
LDL Cholesterol: 63 mg/dL (ref 0–99)
NonHDL: 85.75
Total CHOL/HDL Ratio: 4
Triglycerides: 115 mg/dL (ref 0.0–149.0)
VLDL: 23 mg/dL (ref 0.0–40.0)

## 2020-08-22 LAB — TSH: TSH: 1.22 u[IU]/mL (ref 0.35–4.50)

## 2020-08-22 NOTE — Progress Notes (Signed)
Subjective:    Patient ID: Ray Williams, male    DOB: 07-13-1951, 70 y.o.   MRN: [id number]  Chief Complaint  Patient presents with  . Follow-up    HPI Patient is in today for follow up on chronic medical concerns. No recent febrile illness or hospitalizations. Unfortunately he continues to smoke. He is thinking about quitting but has not managed it yet. He is trying to maintain a heart healthy diet. No regular exercise but he stays busy. Denies CP/palp/SOB/HA/congestion/fevers/GI or GU c/o. Taking meds as prescribed  Past Medical History:  Diagnosis Date  . Anemia 01/26/2018  . Anxiety   . Depression   . Depression with anxiety 01/08/2016  . H/O ischemic right MCA stroke   . History of chicken pox 05/11/2017  . Hyperlipidemia   . Hypertension   . Liver disease    hepatitis c  . Right shoulder pain 05/11/2017  . Stroke [facility])     Past Surgical History:  Procedure Laterality Date  . EYE SURGERY Bilateral 2021   cataracts    Family History  Problem Relation Age of Onset  . Heart disease Mother        MI  . Heart attack Mother   . Asthma Mother   . Hyperlipidemia Mother   . Hypertension Mother   . Diabetes Father   . Healthy Maternal Grandmother   . Healthy Maternal Grandfather   . Healthy Paternal Grandmother   . Healthy Paternal Grandfather   . Hyperlipidemia Sister     Social History   Socioeconomic History  . Marital status: Married    Spouse name: Not on file  . Number of children: 2  . Years of education: 24  . Highest education level: Not on file  Occupational History  . Occupation: Grading Contractor  Tobacco Use  . Smoking status: Former Smoker    Quit date: 01/26/2015    Years since quitting: 5.5  . Smokeless tobacco: Never Used  Substance and Sexual Activity  . Alcohol use: No    Alcohol/week: 0.0 standard drinks  . Drug use: No  . Sexual activity: Not Currently  Other Topics Concern  . Not on file  Social History Narrative   Fun: Fish,  collectable cars/classic cars   Denies religious beliefs effecting health care   Retired from Therapist, occupational   Lives with wife   No dietary restrictions, wears seat belt.    Social Determinants of Health   Financial Resource Strain: Not on file  Food Insecurity: Not on file  Transportation Needs: Not on file  Physical Activity: Not on file  Stress: Not on file  Social Connections: Not on file  Intimate Partner Violence: Not on file    Outpatient Medications Prior to Visit  Medication Sig Dispense Refill  . atorvastatin (LIPITOR) 80 MG tablet Take 1 tablet by mouth once daily 90 tablet 0  . citalopram (CELEXA) 10 MG tablet Take 1 tablet (10 mg total) by mouth daily. 90 tablet 1  . clopidogrel (PLAVIX) 75 MG tablet Take 1 tablet by mouth once daily 90 tablet 1  . lisinopril-hydrochlorothiazide (ZESTORETIC) 20-25 MG tablet Take 1 tablet by mouth once daily 90 tablet 1   No facility-administered medications prior to visit.    No Known Allergies  Review of Systems  Constitutional: Negative for fever and malaise/fatigue.  HENT: Negative for congestion.   Eyes: Negative for blurred vision.  Respiratory: Negative for shortness of breath.   Cardiovascular: Negative for chest pain, palpitations  and leg swelling.  Gastrointestinal: Negative for abdominal pain, blood in stool and nausea.  Genitourinary: Negative for dysuria and frequency.  Musculoskeletal: Negative for falls and neck pain.  Skin: Negative for rash.  Neurological: Negative for dizziness, loss of consciousness and headaches.  Endo/Heme/Allergies: Negative for environmental allergies.  Psychiatric/Behavioral: Negative for depression. The patient is not nervous/anxious.        Objective:    Physical Exam Vitals and nursing note reviewed.  Constitutional:      General: He is not in acute distress.    Appearance: He is well-developed and well-nourished.  HENT:     Head: Normocephalic and atraumatic.      Nose: Nose normal.  Eyes:     General:        Right eye: No discharge.        Left eye: No discharge.  Cardiovascular:     Rate and Rhythm: Normal rate and regular rhythm.     Heart sounds: No murmur heard.   Pulmonary:     Effort: Pulmonary effort is normal.     Breath sounds: Normal breath sounds.  Abdominal:     General: Bowel sounds are normal.     Palpations: Abdomen is soft.     Tenderness: There is no abdominal tenderness.  Musculoskeletal:        General: No edema.     Cervical back: Normal range of motion and neck supple.  Skin:    General: Skin is warm and dry.  Neurological:     Mental Status: He is alert and oriented to person, place, and time.  Psychiatric:        Mood and Affect: Mood and affect normal.     There were no vitals taken for this visit. Wt Readings from Last 3 Encounters:  07/26/18 161 lb 3.2 oz (73.1 kg)  01/24/18 160 lb (72.6 kg)  06/04/17 150 lb (68 kg)    Diabetic Foot Exam - Simple   No data filed    Lab Results  Component Value Date   WBC 8.2 07/12/2019   HGB 12.6 (L) 07/12/2019   HCT 38.3 (L) 07/12/2019   PLT 256.0 07/12/2019   GLUCOSE 105 (H) 07/12/2019   CHOL 125 07/12/2019   TRIG 94.0 07/12/2019   HDL 32.50 (L) 07/12/2019   LDLDIRECT 60.0 01/24/2018   LDLCALC 74 07/12/2019   ALT 16 07/12/2019   AST 8 07/12/2019   NA 140 07/12/2019   K 5.7 No hemolysis seen (H) 07/12/2019   CL 106 07/12/2019   CREATININE 1.28 07/12/2019   BUN 24 (H) 07/12/2019   CO2 26 07/12/2019   TSH 1.42 07/12/2019   PSA 0.59 08/31/2016   INR 0.96 01/08/2016   HGBA1C 5.8 07/12/2019    Lab Results  Component Value Date   TSH 1.42 07/12/2019   Lab Results  Component Value Date   WBC 8.2 07/12/2019   HGB 12.6 (L) 07/12/2019   HCT 38.3 (L) 07/12/2019   MCV 91.2 07/12/2019   PLT 256.0 07/12/2019   Lab Results  Component Value Date   NA 140 07/12/2019   K 5.7 No hemolysis seen (H) 07/12/2019   CO2 26 07/12/2019   GLUCOSE 105 (H)  07/12/2019   BUN 24 (H) 07/12/2019   CREATININE 1.28 07/12/2019   BILITOT 0.6 07/12/2019   ALKPHOS 91 07/12/2019   AST 8 07/12/2019   ALT 16 07/12/2019   PROT 7.0 07/12/2019   ALBUMIN 4.4 07/12/2019   CALCIUM 9.5 07/12/2019  ANIONGAP 11 04/05/2015   GFR 56.01 (L) 07/12/2019   Lab Results  Component Value Date   CHOL 125 07/12/2019   Lab Results  Component Value Date   HDL 32.50 (L) 07/12/2019   Lab Results  Component Value Date   LDLCALC 74 07/12/2019   Lab Results  Component Value Date   TRIG 94.0 07/12/2019   Lab Results  Component Value Date   CHOLHDL 4 07/12/2019   Lab Results  Component Value Date   HGBA1C 5.8 07/12/2019       Assessment & Plan:   Problem List Items Addressed This Visit    Essential hypertension    Well controlled, no changes to meds. Encouraged heart healthy diet such as the DASH diet and exercise as tolerated.       Relevant Orders   CBC   Comprehensive metabolic panel   TSH   Tobacco abuse    Encouraged complete cessation. Discussed need to quit as relates to risk of numerous cancers, cardiac and pulmonary disease as well as neurologic complications. Counseled for greater than 3 minutes. He is not interested at this time but encouraged to try and wsit 5-10 minutes every time before smoking that cigarette. He does think about it but he is struggling. He is at about 1 ppd      HLD (hyperlipidemia)    Encouraged heart healthy diet, increase exercise, avoid trans fats, consider a krill oil cap daily      Relevant Orders   Lipid panel   Hyperglycemia    hgba1c acceptable, minimize simple carbs. Increase exercise as tolerated.       Relevant Orders   Hemoglobin A1c      I am having [name] maintain his clopidogrel, atorvastatin, lisinopril-hydrochlorothiazide, and citalopram.  No orders of the defined types were placed in this encounter.    Penni Homans, MD

## 2020-08-22 NOTE — Patient Instructions (Signed)

## 2020-08-22 NOTE — Assessment & Plan Note (Signed)
Encouraged heart healthy diet, increase exercise, avoid trans fats, consider a krill oil cap daily 

## 2020-08-22 NOTE — Assessment & Plan Note (Signed)
Well controlled, no changes to meds. Encouraged heart healthy diet such as the DASH diet and exercise as tolerated.  °

## 2020-08-22 NOTE — Assessment & Plan Note (Signed)
hgba1c acceptable, minimize simple carbs. Increase exercise as tolerated.  

## 2020-08-22 NOTE — Assessment & Plan Note (Signed)
Encouraged complete cessation. Discussed need to quit as relates to risk of numerous cancers, cardiac and pulmonary disease as well as neurologic complications. Counseled for greater than 3 minutes. He is not interested at this time but encouraged to try and wsit 5-10 minutes every time before smoking that cigarette. He does think about it but he is struggling. He is at about 1 ppd

## 2020-08-26 NOTE — Progress Notes (Signed)
No answer, vm full 1/17

## 2020-09-06 ENCOUNTER — Other Ambulatory Visit: Payer: Medicare HMO

## 2020-09-09 ENCOUNTER — Other Ambulatory Visit: Payer: Self-pay | Admitting: Family Medicine

## 2020-09-10 ENCOUNTER — Other Ambulatory Visit: Payer: Self-pay | Admitting: Family Medicine

## 2020-09-10 DIAGNOSIS — I6320 Cerebral infarction due to unspecified occlusion or stenosis of unspecified precerebral arteries: Secondary | ICD-10-CM

## 2020-10-13 ENCOUNTER — Other Ambulatory Visit: Payer: Self-pay | Admitting: Family Medicine

## 2020-10-13 DIAGNOSIS — I6320 Cerebral infarction due to unspecified occlusion or stenosis of unspecified precerebral arteries: Secondary | ICD-10-CM

## 2020-12-08 ENCOUNTER — Other Ambulatory Visit: Payer: Self-pay | Admitting: Family Medicine

## 2020-12-08 DIAGNOSIS — I10 Essential (primary) hypertension: Secondary | ICD-10-CM

## 2021-02-27 ENCOUNTER — Ambulatory Visit (INDEPENDENT_AMBULATORY_CARE_PROVIDER_SITE_OTHER): Payer: Medicare HMO | Admitting: Family Medicine

## 2021-02-27 ENCOUNTER — Other Ambulatory Visit: Payer: Self-pay

## 2021-02-27 ENCOUNTER — Encounter: Payer: Self-pay | Admitting: Family Medicine

## 2021-02-27 VITALS — BP 122/70 | HR 94 | Temp 97.8°F | Resp 12 | Ht 66.0 in | Wt 159.6 lb

## 2021-02-27 DIAGNOSIS — D649 Anemia, unspecified: Secondary | ICD-10-CM

## 2021-02-27 DIAGNOSIS — Z720 Tobacco use: Secondary | ICD-10-CM

## 2021-02-27 DIAGNOSIS — R739 Hyperglycemia, unspecified: Secondary | ICD-10-CM

## 2021-02-27 DIAGNOSIS — Z0000 Encounter for general adult medical examination without abnormal findings: Secondary | ICD-10-CM

## 2021-02-27 DIAGNOSIS — R351 Nocturia: Secondary | ICD-10-CM

## 2021-02-27 DIAGNOSIS — Z1211 Encounter for screening for malignant neoplasm of colon: Secondary | ICD-10-CM | POA: Diagnosis not present

## 2021-02-27 DIAGNOSIS — E782 Mixed hyperlipidemia: Secondary | ICD-10-CM

## 2021-02-27 DIAGNOSIS — I6521 Occlusion and stenosis of right carotid artery: Secondary | ICD-10-CM

## 2021-02-27 DIAGNOSIS — I10 Essential (primary) hypertension: Secondary | ICD-10-CM

## 2021-02-27 MED ORDER — CITALOPRAM HYDROBROMIDE 20 MG PO TABS: 20.0000 mg | ORAL_TABLET | Freq: Every day | ORAL | 1 refills | Status: DC

## 2021-02-27 MED ORDER — NICOTINE 7 MG/24HR TD PT24: 7.0000 mg | MEDICATED_PATCH | Freq: Every day | TRANSDERMAL | 5 refills | Status: DC

## 2021-02-27 NOTE — Assessment & Plan Note (Signed)
Encourage heart healthy diet such as MIND or DASH diet, increase exercise, avoid trans fats, simple carbohydrates and processed foods, consider a krill or fish or flaxseed oil cap daily.  °

## 2021-02-27 NOTE — Patient Instructions (Addendum)
Paxlovid is the new COVID medication we can give you if you get COVID so make sure you test if you have symptoms because we have to treat by day 5 of symptoms for it to be effective. If you are positive let us know so we can treat. If a home test is negative and your symptoms are persistent get a PCR test. Can check testing locations at [email or web address] If you are positive we will make an appointment with [place] and we will send in Paxlovid if you would like it. Check with your pharmacy before we meet to confirm they have it in stock, if they do not then we can get the prescription at the [place]   Shingrix is the new shingles shot, 2 shots over 2-6 months, confirm coverage with insurance and document, then can return here for shots with nurse appt or at pharmacy   Do not do the cologuard until after 03/31/21 Time to go see dermatology (the skin doctor) Preventive Care 65 Years and Older, Male Preventive care refers to lifestyle choices and visits with your health care provider that can promote health and wellness. This includes: A yearly physical exam. This is also called an annual wellness visit. Regular dental and eye exams. Immunizations. Screening for certain conditions. Healthy lifestyle choices, such as: Eating a healthy diet. Getting regular exercise. Not using drugs or products that contain nicotine and tobacco. Limiting alcohol use. What can I expect for my preventive care visit? Physical exam Your health care provider will check your: Height and weight. These may be used to calculate your BMI (body mass index). BMI is a measurement that tells if you are at a healthy weight. Heart rate and blood pressure. Body temperature. Skin for abnormal spots. Counseling Your health care provider may ask you questions about your: Past medical problems. Family's medical history. Alcohol, tobacco, and drug use. Emotional well-being. Home life and relationship  well-being. Sexual activity. Diet, exercise, and sleep habits. History of falls. Memory and ability to understand (cognition). Work and work Statistician. Access to firearms. What immunizations do I need?  Vaccines are usually given at various ages, according to a schedule. Your health care provider will recommend vaccines for you based on your age, medicalhistory, and lifestyle or other factors, such as travel or where you work. What tests do I need? Blood tests Lipid and cholesterol levels. These may be checked every 5 years, or more often depending on your overall health. Hepatitis C test. Hepatitis B test. Screening Lung cancer screening. You may have this screening every year starting at age 89 if you have a 30-pack-year history of smoking and currently smoke or have quit within the past 15 years. Colorectal cancer screening. All adults should have this screening starting at age 2 and continuing until age 15. Your health care provider may recommend screening at age 39 if you are at increased risk. You will have tests every 1-10 years, depending on your results and the type of screening test. Prostate cancer screening. Recommendations will vary depending on your family history and other risks. Genital exam to check for testicular cancer or hernias. Diabetes screening. This is done by checking your blood sugar (glucose) after you have not eaten for a while (fasting). You may have this done every 1-3 years. Abdominal aortic aneurysm (AAA) screening. You may need this if you are a current or former smoker. STD (sexually transmitted disease) testing, if you are at risk. Follow these instructions at home: Eating and  drinking  Eat a diet that includes fresh fruits and vegetables, whole grains, lean protein, and low-fat dairy products. Limit your intake of foods with high amounts of sugar, saturated fats, and salt. Take vitamin and mineral supplements as recommended by your health care  provider. Do not drink alcohol if your health care provider tells you not to drink. If you drink alcohol: Limit how much you have to 0-2 drinks a day. Be aware of how much alcohol is in your drink. In the [place] one drink equals one 12 oz bottle of beer (355 mL), one 5 oz glass of wine (148 mL), or one 1 oz glass of hard liquor (44 mL).  Lifestyle Take daily care of your teeth and gums. Brush your teeth every morning and night with fluoride toothpaste. Floss one time each day. Stay active. Exercise for at least 30 minutes 5 or more days each week. Do not use any products that contain nicotine or tobacco, such as cigarettes, e-cigarettes, and chewing tobacco. If you need help quitting, ask your health care provider. Do not use drugs. If you are sexually active, practice safe sex. Use a condom or other form of protection to prevent STIs (sexually transmitted infections). Talk with your health care provider about taking a low-dose aspirin or statin. Find healthy ways to cope with stress, such as: Meditation, yoga, or listening to music. Journaling. Talking to a trusted person. Spending time with friends and family. Safety Always wear your seat belt while driving or riding in a vehicle. Do not drive: If you have been drinking alcohol. Do not ride with someone who has been drinking. When you are tired or distracted. While texting. Wear a helmet and other protective equipment during sports activities. If you have firearms in your house, make sure you follow all gun safety procedures. What's next? Visit your health care provider once a year for an annual wellness visit. Ask your health care provider how often you should have your eyes and teeth checked. Stay up to date on all vaccines. This information is not intended to replace advice given to you by your health care provider. Make sure you discuss any questions you have with your healthcare provider. Document Revised: 04/25/2019 Document  Reviewed: 07/21/2018 Elsevier Patient Education  2022 [employer].

## 2021-02-27 NOTE — Assessment & Plan Note (Signed)
hgba1c acceptable, minimize simple carbs. Increase exercise as tolerated. Continue current meds 

## 2021-02-28 LAB — COMPREHENSIVE METABOLIC PANEL
ALT: 13 U/L (ref 0–53)
AST: 8 U/L (ref 0–37)
Albumin: 4.3 g/dL (ref 3.5–5.2)
Alkaline Phosphatase: 71 U/L (ref 39–117)
BUN: 21 mg/dL (ref 6–23)
CO2: 27 mEq/L (ref 19–32)
Calcium: 9.3 mg/dL (ref 8.4–10.5)
Chloride: 105 mEq/L (ref 96–112)
Creatinine, Ser: 1.29 mg/dL (ref 0.40–1.50)
GFR: 56.8 mL/min — ABNORMAL LOW (ref >60.00)
Glucose, Bld: 82 mg/dL (ref 70–99)
Potassium: 5.3 mEq/L — ABNORMAL HIGH (ref 3.5–5.1)
Sodium: 140 mEq/L (ref 135–145)
Total Bilirubin: 0.4 mg/dL (ref 0.2–1.2)
Total Protein: 7 g/dL (ref 6.0–8.3)

## 2021-02-28 LAB — CBC WITH DIFFERENTIAL/PLATELET
Basophils Absolute: 0.1 10*3/uL (ref 0.0–0.1)
Basophils Relative: 0.9 % (ref 0.0–3.0)
Eosinophils Absolute: 0.3 10*3/uL (ref 0.0–0.7)
Eosinophils Relative: 4.5 % (ref 0.0–5.0)
HCT: 35.2 % — ABNORMAL LOW (ref 39.0–52.0)
Hemoglobin: 11.7 g/dL — ABNORMAL LOW (ref 13.0–17.0)
Lymphocytes Relative: 18.9 % (ref 12.0–46.0)
Lymphs Abs: 1.3 10*3/uL (ref 0.7–4.0)
MCHC: 33.4 g/dL (ref 30.0–36.0)
MCV: 90 fl (ref 78.0–100.0)
Monocytes Absolute: 0.5 10*3/uL (ref 0.1–1.0)
Monocytes Relative: 7.7 % (ref 3.0–12.0)
Neutro Abs: 4.5 10*3/uL (ref 1.4–7.7)
Neutrophils Relative %: 68 % (ref 43.0–77.0)
Platelets: 230 10*3/uL (ref 150.0–400.0)
RBC: 3.91 Mil/uL — ABNORMAL LOW (ref 4.22–5.81)
RDW: 14.8 % (ref 11.5–15.5)
WBC: 6.6 10*3/uL (ref 4.0–10.5)

## 2021-02-28 LAB — LIPID PANEL
Cholesterol: 125 mg/dL (ref 0–200)
HDL: 34.5 mg/dL — ABNORMAL LOW (ref >39.00)
LDL Cholesterol: 62 mg/dL (ref 0–99)
NonHDL: 90.65
Total CHOL/HDL Ratio: 4
Triglycerides: 144 mg/dL (ref 0.0–149.0)
VLDL: 28.8 mg/dL (ref 0.0–40.0)

## 2021-02-28 LAB — HEMOGLOBIN A1C: Hgb A1c MFr Bld: 6.3 % (ref 4.6–6.5)

## 2021-02-28 LAB — PSA: PSA: 1.31 ng/mL (ref 0.10–4.00)

## 2021-02-28 LAB — TSH: TSH: 1.09 u[IU]/mL (ref 0.35–5.50)

## 2021-03-02 NOTE — Assessment & Plan Note (Signed)
Repeat carotid artery doppler

## 2021-03-02 NOTE — Assessment & Plan Note (Addendum)
Continues to smokes ultralight cigarettes about a 1/2 ppd from age 70 to 43. He is encouraged to quit and he is interested but not quite ready. He is aware of the long term risks.

## 2021-03-02 NOTE — Assessment & Plan Note (Signed)
Patient encouraged to maintain heart healthy diet, regular exercise, adequate sleep. Consider daily probiotics. Take medications as prescribed. Cologuard offered

## 2021-03-02 NOTE — Progress Notes (Signed)
Subjective:    Patient ID: Ray Williams, male    DOB: [birth year], 70 y.o.   MRN: [id number]  Chief Complaint  Patient presents with   Annual Exam    HPI Patient is in today for annual preventative exam and follow up on chronic medical concerns including hyperglycemia and hyperlipidemia. No polyuria or polydipsia. He is trying to stay active and maintain a heart healthy diet. Unfortunately he continues to smoke a 1/2 ppd. No acute concerns. Denies CP/palp/SOB/HA/congestion/fevers/GI or GU c/o. Taking meds as prescribed   Past Medical History:  Diagnosis Date   Anemia 01/26/2018   Anxiety    Depression    Depression with anxiety 01/08/2016   H/O ischemic right MCA stroke    History of chicken pox 05/11/2017   Hyperlipidemia    Hypertension    Liver disease    hepatitis c   Right shoulder pain 05/11/2017   Stroke [facility])     Past Surgical History:  Procedure Laterality Date   CATARACT EXTRACTION, BILATERAL  2022   EYE SURGERY Bilateral 2021   cataracts    Family History  Problem Relation Age of Onset   Heart disease Mother        MI   Heart attack Mother    Asthma Mother    Hyperlipidemia Mother    Hypertension Mother    Diabetes Father    Healthy Maternal Grandmother    Healthy Maternal Grandfather    Healthy Paternal Grandmother    Healthy Paternal Grandfather    Hyperlipidemia Sister     Social History   Socioeconomic History   Marital status: Married    Spouse name: Not on file   Number of children: 2   Years of education: 12   Highest education level: Not on file  Occupational History   Occupation: Teaching laboratory technician  Tobacco Use   Smoking status: Every Day    Packs/day: 0.50    Types: Cigarettes   Smokeless tobacco: Never  Substance and Sexual Activity   Alcohol use: No    Alcohol/week: 0.0 standard drinks   Drug use: No   Sexual activity: Not Currently  Other Topics Concern   Not on file  Social History Narrative   Fun: Fish, collectable  cars/classic cars   Denies religious beliefs effecting health care   Retired from Therapist, occupational   Lives with wife   No dietary restrictions, wears seat belt.    Social Determinants of Health   Financial Resource Strain: Not on file  Food Insecurity: Not on file  Transportation Needs: Not on file  Physical Activity: Not on file  Stress: Not on file  Social Connections: Not on file  Intimate Partner Violence: Not on file    Outpatient Medications Prior to Visit  Medication Sig Dispense Refill   atorvastatin (LIPITOR) 80 MG tablet Take 1 tablet (80 mg total) by mouth daily. 90 tablet 1   clopidogrel (PLAVIX) 75 MG tablet Take 1 tablet by mouth once daily 90 tablet 1   lisinopril-hydrochlorothiazide (ZESTORETIC) 20-25 MG tablet Take 1 tablet by mouth once daily 90 tablet 0   citalopram (CELEXA) 10 MG tablet Take 1 tablet (10 mg total) by mouth daily. 90 tablet 1   No facility-administered medications prior to visit.    No Known Allergies  Review of Systems  Constitutional:  Negative for chills, fever and malaise/fatigue.  HENT:  Negative for congestion and hearing loss.   Eyes:  Negative for discharge.  Respiratory:  Negative for  cough, sputum production and shortness of breath.   Cardiovascular:  Negative for chest pain, palpitations and leg swelling.  Gastrointestinal:  Negative for abdominal pain, blood in stool, constipation, diarrhea, heartburn, nausea and vomiting.  Genitourinary:  Negative for dysuria, frequency, hematuria and urgency.  Musculoskeletal:  Negative for back pain, falls and myalgias.  Skin:  Negative for rash.  Neurological:  Negative for dizziness, sensory change, loss of consciousness, weakness and headaches.  Endo/Heme/Allergies:  Negative for environmental allergies. Does not bruise/bleed easily.  Psychiatric/Behavioral:  Negative for depression and suicidal ideas. The patient is not nervous/anxious and does not have insomnia.        Objective:    Physical Exam Constitutional:      General: He is not in acute distress.    Appearance: Normal appearance. He is not ill-appearing or toxic-appearing.  HENT:     Head: Normocephalic and atraumatic.     Right Ear: Tympanic membrane, ear canal and external ear normal. There is no impacted cerumen.     Left Ear: Tympanic membrane, ear canal and external ear normal. There is no impacted cerumen.     Nose: Nose normal.  Eyes:     General:        Right eye: No discharge.        Left eye: No discharge.     Extraocular Movements: Extraocular movements intact.     Conjunctiva/sclera: Conjunctivae normal.     Pupils: Pupils are equal, round, and reactive to light.  Cardiovascular:     Rate and Rhythm: Normal rate and regular rhythm.  Pulmonary:     Effort: Pulmonary effort is normal.     Breath sounds: Normal breath sounds.  Abdominal:     Palpations: Abdomen is soft. There is no mass.     Tenderness: There is no abdominal tenderness. There is no guarding.  Musculoskeletal:        General: Normal range of motion.     Cervical back: Normal range of motion and neck supple. No tenderness.  Lymphadenopathy:     Cervical: No cervical adenopathy.  Skin:    General: Skin is warm and dry.     Findings: No rash.  Neurological:     Mental Status: He is alert and oriented to person, place, and time.     Cranial Nerves: No cranial nerve deficit.  Psychiatric:        Mood and Affect: Mood normal.        Behavior: Behavior normal.    BP 122/70 (BP Location: Right Arm, Cuff Size: Normal)   Pulse 94   Temp 97.8 F (36.6 C) (Oral)   Resp 12   Ht '5\' 6"'$  (1.676 m)   Wt 159 lb 9.6 oz (72.4 kg)   SpO2 97%   BMI 25.76 kg/m  Wt Readings from Last 3 Encounters:  02/27/21 159 lb 9.6 oz (72.4 kg)  07/26/18 161 lb 3.2 oz (73.1 kg)  01/24/18 160 lb (72.6 kg)    Diabetic Foot Exam - Simple   No data filed    Lab Results  Component Value Date   WBC 6.6 02/27/2021   HGB 11.7  (L) 02/27/2021   HCT 35.2 (L) 02/27/2021   PLT 230.0 02/27/2021   GLUCOSE 82 02/27/2021   CHOL 125 02/27/2021   TRIG 144.0 02/27/2021   HDL 34.50 (L) 02/27/2021   LDLDIRECT 60.0 01/24/2018   LDLCALC 62 02/27/2021   ALT 13 02/27/2021   AST 8 02/27/2021   NA 140 02/27/2021  K 5.3 No hemolysis seen (H) 02/27/2021   CL 105 02/27/2021   CREATININE 1.29 02/27/2021   BUN 21 02/27/2021   CO2 27 02/27/2021   TSH 1.09 02/27/2021   PSA 1.31 02/27/2021   INR 0.96 01/08/2016   HGBA1C 6.3 02/27/2021    Lab Results  Component Value Date   TSH 1.09 02/27/2021   Lab Results  Component Value Date   WBC 6.6 02/27/2021   HGB 11.7 (L) 02/27/2021   HCT 35.2 (L) 02/27/2021   MCV 90.0 02/27/2021   PLT 230.0 02/27/2021   Lab Results  Component Value Date   NA 140 02/27/2021   K 5.3 No hemolysis seen (H) 02/27/2021   CO2 27 02/27/2021   GLUCOSE 82 02/27/2021   BUN 21 02/27/2021   CREATININE 1.29 02/27/2021   BILITOT 0.4 02/27/2021   ALKPHOS 71 02/27/2021   AST 8 02/27/2021   ALT 13 02/27/2021   PROT 7.0 02/27/2021   ALBUMIN 4.3 02/27/2021   CALCIUM 9.3 02/27/2021   ANIONGAP 11 04/05/2015   GFR 56.80 (L) 02/27/2021   Lab Results  Component Value Date   CHOL 125 02/27/2021   Lab Results  Component Value Date   HDL 34.50 (L) 02/27/2021   Lab Results  Component Value Date   LDLCALC 62 02/27/2021   Lab Results  Component Value Date   TRIG 144.0 02/27/2021   Lab Results  Component Value Date   CHOLHDL 4 02/27/2021   Lab Results  Component Value Date   HGBA1C 6.3 02/27/2021       Assessment & Plan:   Problem List Items Addressed This Visit     ICAO (internal carotid artery occlusion)    Repeat carotid artery doppler       Relevant Orders   US Carotid Duplex Bilateral   Essential hypertension - Primary    Well controlled, no changes to meds. Encouraged heart healthy diet such as the DASH diet and exercise as tolerated.        Relevant Orders   CBC with  Differential/Platelet (Completed)   Comprehensive metabolic panel (Completed)   TSH (Completed)   Tobacco abuse    Continues to smokes ultralight cigarettes about a 1/2 ppd from age 64 to 26. He is encouraged to quit and he is interested but not quite ready. He is aware of the long term risks.        HLD (hyperlipidemia)    Encourage heart healthy diet such as MIND or DASH diet, increase exercise, avoid trans fats, simple carbohydrates and processed foods, consider a krill or fish or flaxseed oil cap daily.        Relevant Orders   Lipid panel (Completed)   Preventative health care    Patient encouraged to maintain heart healthy diet, regular exercise, adequate sleep. Consider daily probiotics. Take medications as prescribed. Cologuard offered       Hyperglycemia    hgba1c acceptable, minimize simple carbs. Increase exercise as tolerated. Continue current meds       Relevant Orders   Hemoglobin A1c (Completed)   Anemia    Mild, iFOB positive encouraged to proceed with colonoscopy. Increase leafy greens, consider increased lean red meat and using cast iron cookware. Continue to monitor, report any concerns.        Other Visit Diagnoses     Nocturia       Relevant Orders   PSA (Completed)   Special screening for malignant neoplasms, colon       Relevant  Orders   Cologuard       I have discontinued Jashawn Jacuinde's citalopram. I am also having him start on nicotine and citalopram. Additionally, I am having him maintain his atorvastatin, clopidogrel, and lisinopril-hydrochlorothiazide.  Meds ordered this encounter  Medications   nicotine ([facility] NICOTINE) 7 mg/24hr patch    Sig: Place 1 patch (7 mg total) onto the skin daily.    Dispense:  28 patch    Refill:  5   citalopram (CELEXA) 20 MG tablet    Sig: Take 1 tablet (20 mg total) by mouth daily.    Dispense:  90 tablet    Refill:  1     Penni Homans, MD

## 2021-03-02 NOTE — Assessment & Plan Note (Signed)
Mild, iFOB positive encouraged to proceed with colonoscopy. Increase leafy greens, consider increased lean red meat and using cast iron cookware. Continue to monitor, report any concerns.

## 2021-03-02 NOTE — Assessment & Plan Note (Signed)
Well controlled, no changes to meds. Encouraged heart healthy diet such as the DASH diet and exercise as tolerated.  °

## 2021-03-10 ENCOUNTER — Encounter: Payer: Self-pay | Admitting: *Deleted

## 2021-03-10 ENCOUNTER — Other Ambulatory Visit: Payer: Self-pay | Admitting: Family Medicine

## 2021-03-10 DIAGNOSIS — I10 Essential (primary) hypertension: Secondary | ICD-10-CM

## 2021-03-11 ENCOUNTER — Other Ambulatory Visit: Payer: Self-pay | Admitting: *Deleted

## 2021-03-11 MED ORDER — ATORVASTATIN CALCIUM 80 MG PO TABS
80.0000 mg | ORAL_TABLET | Freq: Every day | ORAL | 1 refills | Status: DC
Start: 2021-03-11 — End: 2021-08-26

## 2021-03-24 DIAGNOSIS — L259 Unspecified contact dermatitis, unspecified cause: Secondary | ICD-10-CM | POA: Diagnosis not present

## 2021-03-24 DIAGNOSIS — L299 Pruritus, unspecified: Secondary | ICD-10-CM | POA: Diagnosis not present

## 2021-03-25 ENCOUNTER — Other Ambulatory Visit: Payer: Self-pay

## 2021-03-25 DIAGNOSIS — D649 Anemia, unspecified: Secondary | ICD-10-CM

## 2021-03-25 DIAGNOSIS — E782 Mixed hyperlipidemia: Secondary | ICD-10-CM

## 2021-03-25 DIAGNOSIS — E875 Hyperkalemia: Secondary | ICD-10-CM

## 2021-03-27 DIAGNOSIS — R21 Rash and other nonspecific skin eruption: Secondary | ICD-10-CM | POA: Diagnosis not present

## 2021-04-03 ENCOUNTER — Other Ambulatory Visit (INDEPENDENT_AMBULATORY_CARE_PROVIDER_SITE_OTHER): Payer: Medicare HMO

## 2021-04-03 ENCOUNTER — Encounter: Payer: Self-pay | Admitting: *Deleted

## 2021-04-03 ENCOUNTER — Other Ambulatory Visit: Payer: Self-pay

## 2021-04-03 DIAGNOSIS — E875 Hyperkalemia: Secondary | ICD-10-CM | POA: Diagnosis not present

## 2021-04-03 DIAGNOSIS — D649 Anemia, unspecified: Secondary | ICD-10-CM

## 2021-04-03 LAB — CBC WITH DIFFERENTIAL/PLATELET
Basophils Absolute: 0 10*3/uL (ref 0.0–0.1)
Basophils Relative: 0.4 % (ref 0.0–3.0)
Eosinophils Absolute: 0.2 10*3/uL (ref 0.0–0.7)
Eosinophils Relative: 3.5 % (ref 0.0–5.0)
HCT: 34.7 % — ABNORMAL LOW (ref 39.0–52.0)
Hemoglobin: 11.9 g/dL — ABNORMAL LOW (ref 13.0–17.0)
Lymphocytes Relative: 23.5 % (ref 12.0–46.0)
Lymphs Abs: 1.6 10*3/uL (ref 0.7–4.0)
MCHC: 34.2 g/dL (ref 30.0–36.0)
MCV: 91 fl (ref 78.0–100.0)
Monocytes Absolute: 0.6 10*3/uL (ref 0.1–1.0)
Monocytes Relative: 8.3 % (ref 3.0–12.0)
Neutro Abs: 4.5 10*3/uL (ref 1.4–7.7)
Neutrophils Relative %: 64.3 % (ref 43.0–77.0)
Platelets: 230 10*3/uL (ref 150.0–400.0)
RBC: 3.82 Mil/uL — ABNORMAL LOW (ref 4.22–5.81)
RDW: 14.9 % (ref 11.5–15.5)
WBC: 7 10*3/uL (ref 4.0–10.5)

## 2021-04-03 NOTE — Telephone Encounter (Signed)
Opened in error

## 2021-04-04 LAB — IRON,TIBC AND FERRITIN PANEL
%SAT: 34 % (calc) (ref 20–48)
Ferritin: 143 ng/mL (ref 24–380)
Iron: 86 ug/dL (ref 50–180)
TIBC: 254 mcg/dL (calc) (ref 250–425)

## 2021-04-21 ENCOUNTER — Telehealth: Payer: Self-pay | Admitting: Family Medicine

## 2021-04-21 NOTE — Telephone Encounter (Signed)
Left message for patient to call back and schedule Medicare Annual Wellness Visit (AWV) in office.  ° °If not able to come in office, please offer to do virtually or by telephone.  Left office number and my jabber #[phone]. ° °Due for AWVI ° °Please schedule at anytime with Nurse Health Advisor. °  °

## 2021-06-16 LAB — COLOGUARD

## 2021-06-30 ENCOUNTER — Telehealth (INDEPENDENT_AMBULATORY_CARE_PROVIDER_SITE_OTHER): Payer: Medicare HMO | Admitting: Family Medicine

## 2021-06-30 ENCOUNTER — Other Ambulatory Visit: Payer: Self-pay

## 2021-06-30 DIAGNOSIS — I10 Essential (primary) hypertension: Secondary | ICD-10-CM | POA: Diagnosis not present

## 2021-06-30 DIAGNOSIS — R69 Illness, unspecified: Secondary | ICD-10-CM | POA: Diagnosis not present

## 2021-06-30 DIAGNOSIS — F418 Other specified anxiety disorders: Secondary | ICD-10-CM | POA: Diagnosis not present

## 2021-06-30 DIAGNOSIS — R739 Hyperglycemia, unspecified: Secondary | ICD-10-CM | POA: Diagnosis not present

## 2021-06-30 DIAGNOSIS — E782 Mixed hyperlipidemia: Secondary | ICD-10-CM

## 2021-06-30 NOTE — Progress Notes (Signed)
MyChart Video Visit    Virtual Visit via Video Note   This visit type was conducted due to national recommendations for restrictions regarding the COVID-19 Pandemic (e.g. social distancing) in an effort to limit this patient's exposure and mitigate transmission in our community. This patient is at least at moderate risk for complications without adequate follow up. This format is felt to be most appropriate for this patient at this time. Physical exam was limited by quality of the video and audio technology used for the visit. Inda Castle C. was able to get the patient set up on a video visit.  Patient location: Home Patient and provider in visit Provider location: Office  I discussed the limitations of evaluation and management by telemedicine and the availability of in person appointments. The patient expressed understanding and agreed to proceed.  Visit Date: 06/30/2021  Today's healthcare provider: Penni Homans, MD    Subjective:    Patient ID: Ray Williams, male    DOB: 1951-07-02, 70 y.o.   MRN: [id number]  Chief Complaint  Patient presents with   3 months follow up    HPI Patient is in today for a video visit.  He reports he is doing well. He recently went to [place].  He recently received his cologuard and will send it back when he has some time.  He checks his blood pressure at home and the readings have been good.   He mentions that he has reduced his smoking intake to 1/3 ppd. When he was using the nicotine patches,  he used to feel weird and shaky and did not like the feeling. The lozenges and gum also made him sick.  His mother-in-law just passed away.  Past Medical History:  Diagnosis Date   Anemia 01/26/2018   Anxiety    Depression    Depression with anxiety 01/08/2016   H/O ischemic right MCA stroke    History of chicken pox 05/11/2017   Hyperlipidemia    Hypertension    Liver disease    hepatitis c   Right shoulder pain 05/11/2017   Stroke  [facility])     Past Surgical History:  Procedure Laterality Date   CATARACT EXTRACTION, BILATERAL  2022   EYE SURGERY Bilateral 2021   cataracts    Family History  Problem Relation Age of Onset   Heart disease Mother        MI   Heart attack Mother    Asthma Mother    Hyperlipidemia Mother    Hypertension Mother    Diabetes Father    Healthy Maternal Grandmother    Healthy Maternal Grandfather    Healthy Paternal Grandmother    Healthy Paternal Grandfather    Hyperlipidemia Sister     Social History   Socioeconomic History   Marital status: Married    Spouse name: Not on file   Number of children: 2   Years of education: 12   Highest education level: Not on file  Occupational History   Occupation: Teaching laboratory technician  Tobacco Use   Smoking status: Every Day    Packs/day: 0.50    Types: Cigarettes   Smokeless tobacco: Never  Substance and Sexual Activity   Alcohol use: No    Alcohol/week: 0.0 standard drinks   Drug use: No   Sexual activity: Not Currently  Other Topics Concern   Not on file  Social History Narrative   Fun: Fish, collectable cars/classic cars   Denies religious beliefs effecting health care   Retired  from heavy Radio producer   Lives with wife   No dietary restrictions, wears seat belt.    Social Determinants of Health   Financial Resource Strain: Not on file  Food Insecurity: Not on file  Transportation Needs: Not on file  Physical Activity: Not on file  Stress: Not on file  Social Connections: Not on file  Intimate Partner Violence: Not on file    Outpatient Medications Prior to Visit  Medication Sig Dispense Refill   atorvastatin (LIPITOR) 80 MG tablet Take 1 tablet (80 mg total) by mouth daily. 90 tablet 1   citalopram (CELEXA) 20 MG tablet Take 1 tablet (20 mg total) by mouth daily. 90 tablet 1   clopidogrel (PLAVIX) 75 MG tablet Take 1 tablet by mouth once daily 90 tablet 1   lisinopril-hydrochlorothiazide (ZESTORETIC) 20-25 MG  tablet Take 1 tablet by mouth once daily 90 tablet 1   nicotine ([facility] NICOTINE) 7 mg/24hr patch Place 1 patch (7 mg total) onto the skin daily. 28 patch 5   No facility-administered medications prior to visit.    No Known Allergies  ROS     Objective:    Physical Exam  There were no vitals taken for this visit. Wt Readings from Last 3 Encounters:  02/27/21 159 lb 9.6 oz (72.4 kg)  07/26/18 161 lb 3.2 oz (73.1 kg)  01/24/18 160 lb (72.6 kg)    Diabetic Foot Exam - Simple   No data filed    Lab Results  Component Value Date   WBC 7.0 04/03/2021   HGB 11.9 (L) 04/03/2021   HCT 34.7 (L) 04/03/2021   PLT 230.0 04/03/2021   GLUCOSE 82 02/27/2021   CHOL 125 02/27/2021   TRIG 144.0 02/27/2021   HDL 34.50 (L) 02/27/2021   LDLDIRECT 60.0 01/24/2018   LDLCALC 62 02/27/2021   ALT 13 02/27/2021   AST 8 02/27/2021   NA 140 02/27/2021   K 5.3 No hemolysis seen (H) 02/27/2021   CL 105 02/27/2021   CREATININE 1.29 02/27/2021   BUN 21 02/27/2021   CO2 27 02/27/2021   TSH 1.09 02/27/2021   PSA 1.31 02/27/2021   INR 0.96 01/08/2016   HGBA1C 6.3 02/27/2021    Lab Results  Component Value Date   TSH 1.09 02/27/2021   Lab Results  Component Value Date   WBC 7.0 04/03/2021   HGB 11.9 (L) 04/03/2021   HCT 34.7 (L) 04/03/2021   MCV 91.0 04/03/2021   PLT 230.0 04/03/2021   Lab Results  Component Value Date   NA 140 02/27/2021   K 5.3 No hemolysis seen (H) 02/27/2021   CO2 27 02/27/2021   GLUCOSE 82 02/27/2021   BUN 21 02/27/2021   CREATININE 1.29 02/27/2021   BILITOT 0.4 02/27/2021   ALKPHOS 71 02/27/2021   AST 8 02/27/2021   ALT 13 02/27/2021   PROT 7.0 02/27/2021   ALBUMIN 4.3 02/27/2021   CALCIUM 9.3 02/27/2021   ANIONGAP 11 04/05/2015   GFR 56.80 (L) 02/27/2021   Lab Results  Component Value Date   CHOL 125 02/27/2021   Lab Results  Component Value Date   HDL 34.50 (L) 02/27/2021   Lab Results  Component Value Date   LDLCALC 62 02/27/2021   Lab  Results  Component Value Date   TRIG 144.0 02/27/2021   Lab Results  Component Value Date   CHOLHDL 4 02/27/2021   Lab Results  Component Value Date   HGBA1C 6.3 02/27/2021  Assessment & Plan:   Problem List Items Addressed This Visit     Essential hypertension    Monitor and report any concerns no changes to meds. Encouraged heart healthy diet such as the DASH diet and exercise as tolerated.       HLD (hyperlipidemia)    Encourage heart healthy diet such as MIND or DASH diet, increase exercise, avoid trans fats, simple carbohydrates and processed foods, consider a krill or fish or flaxseed oil cap daily. Tolerating Atorvastatin      Depression with anxiety    He had to bury his 8 year old mother this past weekend and recently lost his Father in Law he feels sadness but feels the Citalopram has been helpful. No changes for now. He will notify us if worsens.       Hyperglycemia    hgba1c acceptable, minimize simple carbs. Increase exercise as tolerated.         No orders of the defined types were placed in this encounter.   I discussed the assessment and treatment plan with the patient. The patient was provided an opportunity to ask questions and all were answered. The patient agreed with the plan and demonstrated an understanding of the instructions.   The patient was advised to call back or seek an in-person evaluation if the symptoms worsen or if the condition fails to improve as anticipated.  I provided 20 minutes of face-to-face time during this encounter.   I,Zite Okoli,acting as a Education administrator for Penni Homans, MD.,have documented all relevant documentation on the behalf of Penni Homans, MD,as directed by  Penni Homans, MD while in the presence of Penni Homans, MD.   I, Mosie Lukes, MD personally performed the services described in this documentation. All medical record entries made by the scribe were at my direction and in my presence. I have reviewed the  chart and agree that the record reflects my personal performance and is accurate and complete     Penni Homans, MD [facility] (phone) [phone] (fax)  [place]

## 2021-06-30 NOTE — Assessment & Plan Note (Addendum)
He had to bury his 69 year old mother this past weekend and recently lost his Father in Law he feels sadness but feels the Citalopram has been helpful. No changes for now. He will notify us if worsens.

## 2021-07-07 NOTE — Assessment & Plan Note (Addendum)
Encourage heart healthy diet such as MIND or DASH diet, increase exercise, avoid trans fats, simple carbohydrates and processed foods, consider a krill or fish or flaxseed oil cap daily. Tolerating Atorvastatin 

## 2021-07-07 NOTE — Assessment & Plan Note (Signed)
Monitor and report any concerns. no changes to meds. Encouraged heart healthy diet such as the DASH diet and exercise as tolerated.  

## 2021-07-07 NOTE — Assessment & Plan Note (Signed)
hgba1c acceptable, minimize simple carbs. Increase exercise as tolerated.  

## 2021-07-18 LAB — COLOGUARD

## 2021-08-24 ENCOUNTER — Other Ambulatory Visit: Payer: Self-pay | Admitting: Family Medicine

## 2021-08-25 ENCOUNTER — Other Ambulatory Visit: Payer: Self-pay

## 2021-08-25 ENCOUNTER — Telehealth: Payer: Self-pay | Admitting: Family Medicine

## 2021-08-25 MED ORDER — CITALOPRAM HYDROBROMIDE 20 MG PO TABS: 20.0000 mg | ORAL_TABLET | Freq: Every day | ORAL | 0 refills | Status: DC

## 2021-08-25 NOTE — Telephone Encounter (Signed)
Medication:  citalopram (CELEXA) 20 MG tablet [[id number]]     Has the patient contacted their pharmacy? Yes.   (If no, request that the patient contact the pharmacy for the refill.) (If yes, when and what did the pharmacy advise?) Pharm advised pt to contact ov. Informed pt fax was submitted 1/15.     Preferred Pharmacy (with phone number or street name):  [facility] [address], [place], [place] [address]  Phone:  [phone]  Fax:  [phone]    Agent: Please be advised that RX refills may take up to 3 business days. We ask that you follow-up with your pharmacy.

## 2021-08-25 NOTE — Telephone Encounter (Signed)
Medication sent.

## 2021-08-26 ENCOUNTER — Other Ambulatory Visit: Payer: Self-pay | Admitting: Family Medicine

## 2021-08-26 DIAGNOSIS — I10 Essential (primary) hypertension: Secondary | ICD-10-CM

## 2021-08-30 DIAGNOSIS — Z1211 Encounter for screening for malignant neoplasm of colon: Secondary | ICD-10-CM | POA: Diagnosis not present

## 2021-09-04 LAB — COLOGUARD: COLOGUARD: NEGATIVE

## 2021-10-27 ENCOUNTER — Ambulatory Visit (INDEPENDENT_AMBULATORY_CARE_PROVIDER_SITE_OTHER): Payer: Medicare HMO

## 2021-10-27 ENCOUNTER — Other Ambulatory Visit: Payer: Self-pay | Admitting: Family Medicine

## 2021-10-27 VITALS — Ht 66.0 in | Wt 159.0 lb

## 2021-10-27 DIAGNOSIS — Z0000 Encounter for general adult medical examination without abnormal findings: Secondary | ICD-10-CM

## 2021-10-27 DIAGNOSIS — I6320 Cerebral infarction due to unspecified occlusion or stenosis of unspecified precerebral arteries: Secondary | ICD-10-CM

## 2021-10-27 NOTE — Progress Notes (Signed)
? ?Subjective:  ? Ray Williams is a 71 y.o. male who presents for an Initial Medicare Annual Wellness Visit. ? ?I connected with Ray Williams today by telephone and verified that I am speaking with the correct person using two identifiers. ?Location patient: home ?Location provider: work ?Persons participating in the virtual visit: patient, nurse.  ?  ?I discussed the limitations, risks, security and privacy concerns of performing an evaluation and management service by telephone and the availability of in person appointments. I also discussed with the patient that there may be a patient responsible charge related to this service. The patient expressed understanding and verbally consented to this telephonic visit.  ?  ?Interactive audio and video telecommunications were attempted between this provider and patient, however failed, due to patient having technical difficulties OR patient did not have access to video capability.  We continued and completed visit with audio only. ? ?Some vital signs may be absent or patient reported.  ? ?Time Spent with patient on telephone encounter: 20 minutes ? ? ?Review of Systems    ? ?Cardiac Risk Factors include: advanced age (>78mn, >>24women);male gender;hypertension;dyslipidemia;smoking/ tobacco exposure ? ?   ?Objective:  ?  ?Today's Vitals  ? 10/27/21 1516  ?Weight: 159 lb (72.1 kg)  ?Height: '5\' 6"'$  (1.676 m)  ? ?Body mass index is 25.66 kg/m?. ? ?Advanced Directives 10/27/2021 04/05/2015 01/27/2015  ?Does Patient Have a Medical Advance Directive? Yes No Yes  ?Type of AParamedicof AVanLiving will - -  ?Copy of HBenedictin Chart? No - copy requested - -  ? ? ?Current Medications (verified) ?Outpatient Encounter Medications as of 10/27/2021  ?Medication Sig  ? atorvastatin (LIPITOR) 80 MG tablet Take 1 tablet by mouth once daily  ? citalopram (CELEXA) 20 MG tablet Take 1 tablet (20 mg total) by mouth daily.  ? clopidogrel (PLAVIX) 75  MG tablet Take 1 tablet by mouth once daily  ? lisinopril-hydrochlorothiazide (ZESTORETIC) 20-25 MG tablet Take 1 tablet by mouth once daily  ? nicotine ([facility] NICOTINE) 7 mg/24hr patch Place 1 patch (7 mg total) onto the skin daily.  ? ?No facility-administered encounter medications on file as of 10/27/2021.  ? ? ?Allergies (verified) ?Patient has no known allergies.  ? ?History: ?Past Medical History:  ?Diagnosis Date  ? Anemia 01/26/2018  ? Anxiety   ? Depression   ? Depression with anxiety 01/08/2016  ? H/O ischemic right MCA stroke   ? History of chicken pox 05/11/2017  ? Hyperlipidemia   ? Hypertension   ? Liver disease   ? hepatitis c  ? Right shoulder pain 05/11/2017  ? Stroke ([facility]   ? ?Past Surgical History:  ?Procedure Laterality Date  ? CATARACT EXTRACTION, BILATERAL  2022  ? EYE SURGERY Bilateral 2021  ? cataracts  ? ?Family History  ?Problem Relation Age of Onset  ? Heart disease Mother   ?     MI  ? Heart attack Mother   ? Asthma Mother   ? Hyperlipidemia Mother   ? Hypertension Mother   ? Diabetes Father   ? Healthy Maternal Grandmother   ? Healthy Maternal Grandfather   ? Healthy Paternal Grandmother   ? Healthy Paternal Grandfather   ? Hyperlipidemia Sister   ? ?Social History  ? ?Socioeconomic History  ? Marital status: Married  ?  Spouse name: Not on file  ? Number of children: 2  ? Years of education: 117 ? Highest education level: Not on file  ?  Occupational History  ? Occupation: Teaching laboratory technician  ?Tobacco Use  ? Smoking status: Every Day  ?  Packs/day: 0.50  ?  Types: Cigarettes  ? Smokeless tobacco: Never  ?Substance and Sexual Activity  ? Alcohol use: No  ?  Alcohol/week: 0.0 standard drinks  ? Drug use: No  ? Sexual activity: Not Currently  ?Other Topics Concern  ? Not on file  ?Social History Narrative  ? Fun: Fish, collectable cars/classic cars  ? Denies religious beliefs effecting health care  ? Retired from Therapist, occupational  ? Lives with wife  ? No dietary restrictions, wears seat  belt.   ? ?Social Determinants of Health  ? ?Financial Resource Strain: Low Risk   ? Difficulty of Paying Living Expenses: Not hard at all  ?Food Insecurity: No Food Insecurity  ? Worried About Charity fundraiser in the Last Year: Never true  ? Ran Out of Food in the Last Year: Never true  ?Transportation Needs: No Transportation Needs  ? Lack of Transportation (Medical): No  ? Lack of Transportation (Non-Medical): No  ?Physical Activity: Inactive  ? Days of Exercise per Week: 0 days  ? Minutes of Exercise per Session: 0 min  ?Stress: No Stress Concern Present  ? Feeling of Stress : Not at all  ?Social Connections: Moderately Isolated  ? Frequency of Communication with Friends and Family: More than three times a week  ? Frequency of Social Gatherings with Friends and Family: More than three times a week  ? Attends Religious Services: Never  ? Active Member of Clubs or Organizations: No  ? Attends Archivist Meetings: Never  ? Marital Status: Married  ? ? ?Tobacco Counseling ?Ready to quit: Not Answered ?Counseling given: Not Answered ? ? ?Clinical Intake: ? ?Pre-visit preparation completed: Yes ? ?Pain : No/denies pain ? ?  ? ?BMI - recorded: 25.66 ?Nutritional Status: BMI 25 -29 Overweight ?Nutritional Risks: None ?Diabetes: No ? ?How often do you need to have someone help you when you read instructions, pamphlets, or other written materials from your doctor or pharmacy?: 1 - Never ? ?Diabetic?No ? ?Interpreter Needed?: No ? ?Information entered by :: Caroleen Hamman LPN ? ? ?Activities of Daily Living ?In your present state of health, do you have any difficulty performing the following activities: 10/27/2021 02/27/2021  ?Hearing? N N  ?Vision? N N  ?Difficulty concentrating or making decisions? N N  ?Walking or climbing stairs? N N  ?Dressing or bathing? N N  ?Doing errands, shopping? N N  ?Preparing Food and eating ? N -  ?Using the Toilet? N -  ?In the past six months, have you accidently leaked urine? N  -  ?Do you have problems with loss of bowel control? N -  ?Managing your Medications? N -  ?Managing your Finances? N -  ?Housekeeping or managing your Housekeeping? N -  ?Some recent data might be hidden  ? ? ?Patient Care Team: ?Mosie Lukes, MD as PCP - General (Family Medicine) ? ?Indicate any recent Medical Services you may have received from other than [facility] providers in the past year (date may be approximate). ? ?   ?Assessment:  ? This is a routine wellness examination for Ray Williams. ? ?Hearing/Vision screen ?Hearing Screening - Comments:: No issues ?Vision Screening - Comments:: Last eye exam-2022-Dr.Snipes ? ?Dietary issues and exercise activities discussed: ?Current Exercise Habits: The patient does not participate in regular exercise at present, Exercise limited by: None identified ? ? Goals Addressed   ? ?  ?  ?  ?  ?  This Visit's Progress  ?  Patient Stated     ?  Maintain current health ?  ? ?  ? ?Depression Screen ?PHQ 2/9 Scores 10/27/2021 02/27/2021 01/24/2018 05/11/2017 08/31/2016 03/31/2016 01/08/2016  ?PHQ - 2 Score 0 0 0 3 0 2 2  ?PHQ- 9 Score - 0 - 10 - 2 9  ?  ?Fall Risk ?Fall Risk  10/27/2021 08/22/2020 01/24/2018 08/31/2016 04/07/2016  ?Falls in the past year? 0 0 No No No  ?Number falls in past yr: 0 0 - - -  ?Injury with Fall? 0 0 - - -  ?Follow up Falls prevention discussed - - - -  ? ? ?FALL RISK PREVENTION PERTAINING TO THE HOME: ? ?Any stairs in or around the home? Yes  ?If so, are there any without handrails? No  ?Home free of loose throw rugs in walkways, pet beds, electrical cords, etc? Yes  ?Adequate lighting in your home to reduce risk of falls? Yes  ? ?ASSISTIVE DEVICES UTILIZED TO PREVENT FALLS: ? ?Life alert? No  ?Use of a cane, walker or w/c? No  ?Grab bars in the bathroom? No  ?Shower chair or bench in shower? Yes  ?Elevated toilet seat or a handicapped toilet? No  ? ?TIMED UP AND GO: ? ?Was the test performed? No . Phone visit ? ? ?Cognitive Function:Normal cognitive status assessed by  this Nurse Health Advisor. No abnormalities found.  ? ?  ?  ?  ? ?Immunizations ?Immunization History  ?Administered Date(s) Administered  ? Hepatitis A, Adult 03/31/2016  ? Moderna Sars-Covid-2 [employer]

## 2021-10-27 NOTE — Patient Instructions (Signed)
Ray Williams , ?Thank you for taking time to complete your Medicare Wellness Visit. I appreciate your ongoing commitment to your health goals. Please review the following plan we discussed and let me know if I can assist you in the future.  ? ?Screening recommendations/referrals: ?Colonoscopy: Cologuard completed 08/30/2021-Due 08/30/2024 ?Recommended yearly ophthalmology/optometry visit for glaucoma screening and checkup ?Recommended yearly dental visit for hygiene and checkup ? ?Vaccinations: ?Influenza vaccine: Declined ?Pneumococcal vaccine: Up to date ?Tdap vaccine: Up to date ?Shingles vaccine: -May obtain vaccine at your local pharmacy. ?Covid-19: May obtain booster at your local pharmacy. ? ?Advanced directives: Please bring a copy of Living Will and/or Healthcare Power of Attorney for your chart. ? ? ?Conditions/risks identified: See problem list ? ?Next appointment: Follow up in one year for your annual wellness visit.  ? ?Preventive Care 39 Years and Older, Male ?Preventive care refers to lifestyle choices and visits with your health care provider that can promote health and wellness. ?What does preventive care include? ?A yearly physical exam. This is also called an annual well check. ?Dental exams once or twice a year. ?Routine eye exams. Ask your health care provider how often you should have your eyes checked. ?Personal lifestyle choices, including: ?Daily care of your teeth and gums. ?Regular physical activity. ?Eating a healthy diet. ?Avoiding tobacco and drug use. ?Limiting alcohol use. ?Practicing safe sex. ?Taking low doses of aspirin every day. ?Taking vitamin and mineral supplements as recommended by your health care provider. ?What happens during an annual well check? ?The services and screenings done by your health care provider during your annual well check will depend on your age, overall health, lifestyle risk factors, and family history of disease. ?Counseling  ?Your health care provider may  ask you questions about your: ?Alcohol use. ?Tobacco use. ?Drug use. ?Emotional well-being. ?Home and relationship well-being. ?Sexual activity. ?Eating habits. ?History of falls. ?Memory and ability to understand (cognition). ?Work and work Statistician. ?Screening  ?You may have the following tests or measurements: ?Height, weight, and BMI. ?Blood pressure. ?Lipid and cholesterol levels. These may be checked every 5 years, or more frequently if you are over 79 years old. ?Skin check. ?Lung cancer screening. You may have this screening every year starting at age 53 if you have a 30-pack-year history of smoking and currently smoke or have quit within the past 15 years. ?Fecal occult blood test (FOBT) of the stool. You may have this test every year starting at age 21. ?Flexible sigmoidoscopy or colonoscopy. You may have a sigmoidoscopy every 5 years or a colonoscopy every 10 years starting at age 45. ?Prostate cancer screening. Recommendations will vary depending on your family history and other risks. ?Hepatitis C blood test. ?Hepatitis B blood test. ?Sexually transmitted disease (STD) testing. ?Diabetes screening. This is done by checking your blood sugar (glucose) after you have not eaten for a while (fasting). You may have this done every 1-3 years. ?Abdominal aortic aneurysm (AAA) screening. You may need this if you are a current or former smoker. ?Osteoporosis. You may be screened starting at age 52 if you are at high risk. ?Talk with your health care provider about your test results, treatment options, and if necessary, the need for more tests. ?Vaccines  ?Your health care provider may recommend certain vaccines, such as: ?Influenza vaccine. This is recommended every year. ?Tetanus, diphtheria, and acellular pertussis (Tdap, Td) vaccine. You may need a Td booster every 10 years. ?Zoster vaccine. You may need this after age 47. ?Pneumococcal 13-valent  conjugate (PCV13) vaccine. One dose is recommended after age  16. ?Pneumococcal polysaccharide (PPSV23) vaccine. One dose is recommended after age 69. ?Talk to your health care provider about which screenings and vaccines you need and how often you need them. ?This information is not intended to replace advice given to you by your health care provider. Make sure you discuss any questions you have with your health care provider. ?Document Released: 08/23/2015 Document Revised: 04/15/2016 Document Reviewed: 05/28/2015 ?Elsevier Interactive Patient Education ? 2017 [employer]. ? ?Fall Prevention in the Home ?Falls can cause injuries. They can happen to people of all ages. There are many things you can do to make your home safe and to help prevent falls. ?What can I do on the outside of my home? ?Regularly fix the edges of walkways and driveways and fix any cracks. ?Remove anything that might make you trip as you walk through a door, such as a raised step or threshold. ?Trim any bushes or trees on the path to your home. ?Use bright outdoor lighting. ?Clear any walking paths of anything that might make someone trip, such as rocks or tools. ?Regularly check to see if handrails are loose or broken. Make sure that both sides of any steps have handrails. ?Any raised decks and porches should have guardrails on the edges. ?Have any leaves, snow, or ice cleared regularly. ?Use sand or salt on walking paths during winter. ?Clean up any spills in your garage right away. This includes oil or grease spills. ?What can I do in the bathroom? ?Use night lights. ?Install grab bars by the toilet and in the tub and shower. Do not use towel bars as grab bars. ?Use non-skid mats or decals in the tub or shower. ?If you need to sit down in the shower, use a plastic, non-slip stool. ?Keep the floor dry. Clean up any water that spills on the floor as soon as it happens. ?Remove soap buildup in the tub or shower regularly. ?Attach bath mats securely with double-sided non-slip rug tape. ?Do not have throw  rugs and other things on the floor that can make you trip. ?What can I do in the bedroom? ?Use night lights. ?Make sure that you have a light by your bed that is easy to reach. ?Do not use any sheets or blankets that are too big for your bed. They should not hang down onto the floor. ?Have a firm chair that has side arms. You can use this for support while you get dressed. ?Do not have throw rugs and other things on the floor that can make you trip. ?What can I do in the kitchen? ?Clean up any spills right away. ?Avoid walking on wet floors. ?Keep items that you use a lot in easy-to-reach places. ?If you need to reach something above you, use a strong step stool that has a grab bar. ?Keep electrical cords out of the way. ?Do not use floor polish or wax that makes floors slippery. If you must use wax, use non-skid floor wax. ?Do not have throw rugs and other things on the floor that can make you trip. ?What can I do with my stairs? ?Do not leave any items on the stairs. ?Make sure that there are handrails on both sides of the stairs and use them. Fix handrails that are broken or loose. Make sure that handrails are as long as the stairways. ?Check any carpeting to make sure that it is firmly attached to the stairs. Fix any carpet that  is loose or worn. ?Avoid having throw rugs at the top or bottom of the stairs. If you do have throw rugs, attach them to the floor with carpet tape. ?Make sure that you have a light switch at the top of the stairs and the bottom of the stairs. If you do not have them, ask someone to add them for you. ?What else can I do to help prevent falls? ?Wear shoes that: ?Do not have high heels. ?Have rubber bottoms. ?Are comfortable and fit you well. ?Are closed at the toe. Do not wear sandals. ?If you use a stepladder: ?Make sure that it is fully opened. Do not climb a closed stepladder. ?Make sure that both sides of the stepladder are locked into place. ?Ask someone to hold it for you, if  possible. ?Clearly mark and make sure that you can see: ?Any grab bars or handrails. ?First and last steps. ?Where the edge of each step is. ?Use tools that help you move around (mobility aids) if they are

## 2021-11-24 ENCOUNTER — Other Ambulatory Visit: Payer: Self-pay | Admitting: Family Medicine

## 2021-12-01 ENCOUNTER — Other Ambulatory Visit: Payer: Self-pay

## 2021-12-01 DIAGNOSIS — I10 Essential (primary) hypertension: Secondary | ICD-10-CM

## 2021-12-01 MED ORDER — LISINOPRIL-HYDROCHLOROTHIAZIDE 20-25 MG PO TABS: 1.0000 | ORAL_TABLET | Freq: Every day | ORAL | 0 refills | Status: DC

## 2021-12-10 ENCOUNTER — Encounter: Payer: Self-pay | Admitting: *Deleted

## 2021-12-10 ENCOUNTER — Other Ambulatory Visit: Payer: Self-pay | Admitting: Family Medicine

## 2021-12-10 DIAGNOSIS — I10 Essential (primary) hypertension: Secondary | ICD-10-CM

## 2021-12-17 NOTE — Progress Notes (Signed)
? ?Subjective:  ? ? Patient ID: Ray Williams, male    DOB: 1951-04-15, 71 y.o.   MRN: [id number] ? ?Chief Complaint  ?Patient presents with  ? Follow-up  ? ? ?HPI ?Patient is in today for a follow up. Overalll he is doing well. No recent febrile illness or acute hospitalizations. No complaints of polyuria or polydipsia. Well controlled, no changes to meds. Encouraged heart healthy diet such as the DASH diet and exercise as tolerated.  Enjoys his time with grandkids. Denies CP/palp/SOB/HA/congestion/fevers/GI or GU c/o. Taking meds as prescribed  ? ?Past Medical History:  ?Diagnosis Date  ? Anemia 01/26/2018  ? Anxiety   ? Depression   ? Depression with anxiety 01/08/2016  ? H/O ischemic right MCA stroke   ? History of chicken pox 05/11/2017  ? Hyperlipidemia   ? Hypertension   ? Liver disease   ? hepatitis c  ? Right shoulder pain 05/11/2017  ? Stroke [facility])   ? ? ?Past Surgical History:  ?Procedure Laterality Date  ? CATARACT EXTRACTION, BILATERAL  2022  ? EYE SURGERY Bilateral 2021  ? cataracts  ? ? ?Family History  ?Problem Relation Age of Onset  ? Heart disease Mother   ?     MI  ? Heart attack Mother   ? Asthma Mother   ? Hyperlipidemia Mother   ? Hypertension Mother   ? Diabetes Father   ? Healthy Maternal Grandmother   ? Healthy Maternal Grandfather   ? Healthy Paternal Grandmother   ? Healthy Paternal Grandfather   ? Hyperlipidemia Sister   ? ? ?Social History  ? ?Socioeconomic History  ? Marital status: Married  ?  Spouse name: Not on file  ? Number of children: 2  ? Years of education: 37  ? Highest education level: Not on file  ?Occupational History  ? Occupation: Teaching laboratory technician  ?Tobacco Use  ? Smoking status: Every Day  ?  Packs/day: 0.50  ?  Types: Cigarettes  ? Smokeless tobacco: Never  ?Substance and Sexual Activity  ? Alcohol use: No  ?  Alcohol/week: 0.0 standard drinks  ? Drug use: No  ? Sexual activity: Not Currently  ?Other Topics Concern  ? Not on file  ?Social History Narrative  ? Fun: Fish,  collectable cars/classic cars  ? Denies religious beliefs effecting health care  ? Retired from Therapist, occupational  ? Lives with wife  ? No dietary restrictions, wears seat belt.   ? ?Social Determinants of Health  ? ?Financial Resource Strain: Low Risk   ? Difficulty of Paying Living Expenses: Not hard at all  ?Food Insecurity: No Food Insecurity  ? Worried About Charity fundraiser in the Last Year: Never true  ? Ran Out of Food in the Last Year: Never true  ?Transportation Needs: No Transportation Needs  ? Lack of Transportation (Medical): No  ? Lack of Transportation (Non-Medical): No  ?Physical Activity: Inactive  ? Days of Exercise per Week: 0 days  ? Minutes of Exercise per Session: 0 min  ?Stress: No Stress Concern Present  ? Feeling of Stress : Not at all  ?Social Connections: Moderately Isolated  ? Frequency of Communication with Friends and Family: More than three times a week  ? Frequency of Social Gatherings with Friends and Family: More than three times a week  ? Attends Religious Services: Never  ? Active Member of Clubs or Organizations: No  ? Attends Archivist Meetings: Never  ? Marital Status: Married  ?  Intimate Partner Violence: Not At Risk  ? Fear of Current or Ex-Partner: No  ? Emotionally Abused: No  ? Physically Abused: No  ? Sexually Abused: No  ? ? ?Outpatient Medications Prior to Visit  ?Medication Sig Dispense Refill  ? atorvastatin (LIPITOR) 80 MG tablet Take 1 tablet by mouth once daily 30 tablet 0  ? citalopram (CELEXA) 20 MG tablet Take 1 tablet by mouth once daily 30 tablet 0  ? clopidogrel (PLAVIX) 75 MG tablet Take 1 tablet by mouth once daily 90 tablet 0  ? lisinopril-hydrochlorothiazide (ZESTORETIC) 20-25 MG tablet Take 1 tablet by mouth daily. 30 tablet 0  ? nicotine ([facility] NICOTINE) 7 mg/24hr patch Place 1 patch (7 mg total) onto the skin daily. 28 patch 5  ? ?No facility-administered medications prior to visit.  ? ? ?No Known Allergies ? ?Review of Systems   ?Constitutional:  Negative for fever and malaise/fatigue.  ?HENT:  Negative for congestion.   ?Eyes:  Negative for blurred vision.  ?Respiratory:  Negative for shortness of breath.   ?Cardiovascular:  Negative for chest pain, palpitations and leg swelling.  ?Gastrointestinal:  Negative for abdominal pain, blood in stool and nausea.  ?Genitourinary:  Negative for dysuria and frequency.  ?Musculoskeletal:  Negative for falls.  ?Skin:  Negative for rash.  ?Neurological:  Negative for dizziness, loss of consciousness and headaches.  ?Endo/Heme/Allergies:  Negative for environmental allergies.  ?Psychiatric/Behavioral:  Negative for depression. The patient is not nervous/anxious.   ? ?   ?Objective:  ?  ?Physical Exam ?Constitutional:   ?   General: He is not in acute distress. ?   Appearance: Normal appearance. He is not ill-appearing or toxic-appearing.  ?HENT:  ?   Head: Normocephalic and atraumatic.  ?   Right Ear: External ear normal.  ?   Left Ear: External ear normal.  ?   Nose: Nose normal.  ?Eyes:  ?   General:     ?   Right eye: No discharge.     ?   Left eye: No discharge.  ?Cardiovascular:  ?   Rate and Rhythm: Normal rate.  ?   Heart sounds: Normal heart sounds.  ?Pulmonary:  ?   Effort: Pulmonary effort is normal.  ?Musculoskeletal:     ?   General: No swelling.  ?   Cervical back: Normal range of motion and neck supple.  ?Skin: ?   General: Skin is warm.  ?   Findings: No rash.  ?Neurological:  ?   Mental Status: He is alert and oriented to person, place, and time.  ?   Deep Tendon Reflexes: Reflexes normal.  ?Psychiatric:     ?   Behavior: Behavior normal.  ? ? ?BP 130/80 (BP Location: Left Arm, Patient Position: Sitting, Cuff Size: Normal)   Pulse 85   Resp 20   Ht '5\' 6"'$  (1.676 m)   Wt 155 lb (70.3 kg)   SpO2 96%   BMI 25.02 kg/m?  ?Wt Readings from Last 3 Encounters:  ?12/18/21 155 lb (70.3 kg)  ?10/27/21 159 lb (72.1 kg)  ?02/27/21 159 lb 9.6 oz (72.4 kg)  ? ? ?Diabetic Foot Exam - Simple   ?No  data filed ?  ? ?Lab Results  ?Component Value Date  ? WBC 7.0 04/03/2021  ? HGB 11.9 (L) 04/03/2021  ? HCT 34.7 (L) 04/03/2021  ? PLT 230.0 04/03/2021  ? GLUCOSE 82 02/27/2021  ? CHOL 125 02/27/2021  ? TRIG 144.0 02/27/2021  ? HDL  34.50 (L) 02/27/2021  ? LDLDIRECT 60.0 01/24/2018  ? [place] 62 02/27/2021  ? ALT 13 02/27/2021  ? AST 8 02/27/2021  ? NA 140 02/27/2021  ? K 5.3 No hemolysis seen (H) 02/27/2021  ? CL 105 02/27/2021  ? CREATININE 1.29 02/27/2021  ? BUN 21 02/27/2021  ? CO2 27 02/27/2021  ? TSH 1.09 02/27/2021  ? PSA 1.31 02/27/2021  ? INR 0.96 01/08/2016  ? HGBA1C 6.3 02/27/2021  ? ? ?Lab Results  ?Component Value Date  ? TSH 1.09 02/27/2021  ? ?Lab Results  ?Component Value Date  ? WBC 7.0 04/03/2021  ? HGB 11.9 (L) 04/03/2021  ? HCT 34.7 (L) 04/03/2021  ? MCV 91.0 04/03/2021  ? PLT 230.0 04/03/2021  ? ?Lab Results  ?Component Value Date  ? NA 140 02/27/2021  ? K 5.3 No hemolysis seen (H) 02/27/2021  ? CO2 27 02/27/2021  ? GLUCOSE 82 02/27/2021  ? BUN 21 02/27/2021  ? CREATININE 1.29 02/27/2021  ? BILITOT 0.4 02/27/2021  ? ALKPHOS 71 02/27/2021  ? AST 8 02/27/2021  ? ALT 13 02/27/2021  ? PROT 7.0 02/27/2021  ? ALBUMIN 4.3 02/27/2021  ? CALCIUM 9.3 02/27/2021  ? ANIONGAP 11 04/05/2015  ? GFR 56.80 (L) 02/27/2021  ? ?Lab Results  ?Component Value Date  ? CHOL 125 02/27/2021  ? ?Lab Results  ?Component Value Date  ? HDL 34.50 (L) 02/27/2021  ? ?Lab Results  ?Component Value Date  ? [place] 62 02/27/2021  ? ?Lab Results  ?Component Value Date  ? TRIG 144.0 02/27/2021  ? ?Lab Results  ?Component Value Date  ? CHOLHDL 4 02/27/2021  ? ?Lab Results  ?Component Value Date  ? HGBA1C 6.3 02/27/2021  ? ? ?   ?Assessment & Plan:  ? ?Problem List Items Addressed This Visit   ? ? Essential hypertension - Primary  ?  Well controlled, no changes to meds. Encouraged heart healthy diet such as the DASH diet and exercise as tolerated.  ? ?  ?  ? Relevant Orders  ? TSH  ? Lipid panel  ? Comprehensive metabolic panel  ? CBC   ? Tobacco abuse  ?  Is trying to cut down. Is trying to remove triggers that get him smoking, he is under a 1/2 PPD. He will let us know if he hopes to have our medical help ? ?  ?  ? HLD (hyperlipidemia)  ?  Enc

## 2021-12-18 ENCOUNTER — Encounter: Payer: Self-pay | Admitting: Family Medicine

## 2021-12-18 ENCOUNTER — Ambulatory Visit (INDEPENDENT_AMBULATORY_CARE_PROVIDER_SITE_OTHER): Payer: Medicare HMO | Admitting: Family Medicine

## 2021-12-18 VITALS — BP 130/80 | HR 85 | Resp 20 | Ht 66.0 in | Wt 155.0 lb

## 2021-12-18 DIAGNOSIS — Z720 Tobacco use: Secondary | ICD-10-CM | POA: Diagnosis not present

## 2021-12-18 DIAGNOSIS — R739 Hyperglycemia, unspecified: Secondary | ICD-10-CM | POA: Diagnosis not present

## 2021-12-18 DIAGNOSIS — E782 Mixed hyperlipidemia: Secondary | ICD-10-CM

## 2021-12-18 DIAGNOSIS — D649 Anemia, unspecified: Secondary | ICD-10-CM

## 2021-12-18 DIAGNOSIS — I10 Essential (primary) hypertension: Secondary | ICD-10-CM

## 2021-12-18 LAB — COMPREHENSIVE METABOLIC PANEL
ALT: 12 U/L (ref 0–53)
AST: 7 U/L (ref 0–37)
Albumin: 4.5 g/dL (ref 3.5–5.2)
Alkaline Phosphatase: 83 U/L (ref 39–117)
BUN: 26 mg/dL — ABNORMAL HIGH (ref 6–23)
CO2: 26 mEq/L (ref 19–32)
Calcium: 9.2 mg/dL (ref 8.4–10.5)
Chloride: 102 mEq/L (ref 96–112)
Creatinine, Ser: 1.32 mg/dL (ref 0.40–1.50)
GFR: 54.95 mL/min — ABNORMAL LOW (ref >60.00)
Glucose, Bld: 92 mg/dL (ref 70–99)
Potassium: 4.6 mEq/L (ref 3.5–5.1)
Sodium: 136 mEq/L (ref 135–145)
Total Bilirubin: 0.4 mg/dL (ref 0.2–1.2)
Total Protein: 7.1 g/dL (ref 6.0–8.3)

## 2021-12-18 LAB — LIPID PANEL
Cholesterol: 119 mg/dL (ref 0–200)
HDL: 32.3 mg/dL — ABNORMAL LOW (ref >39.00)
LDL Cholesterol: 64 mg/dL (ref 0–99)
NonHDL: 86.66
Total CHOL/HDL Ratio: 4
Triglycerides: 114 mg/dL (ref 0.0–149.0)
VLDL: 22.8 mg/dL (ref 0.0–40.0)

## 2021-12-18 LAB — CBC
HCT: 37.4 % — ABNORMAL LOW (ref 39.0–52.0)
Hemoglobin: 12.6 g/dL — ABNORMAL LOW (ref 13.0–17.0)
MCHC: 33.7 g/dL (ref 30.0–36.0)
MCV: 89.6 fl (ref 78.0–100.0)
Platelets: 261 10*3/uL (ref 150.0–400.0)
RBC: 4.17 Mil/uL — ABNORMAL LOW (ref 4.22–5.81)
RDW: 14.1 % (ref 11.5–15.5)
WBC: 7.2 10*3/uL (ref 4.0–10.5)

## 2021-12-18 LAB — TSH: TSH: 0.88 u[IU]/mL (ref 0.35–5.50)

## 2021-12-18 LAB — HEMOGLOBIN A1C: Hgb A1c MFr Bld: 6.1 % (ref 4.6–6.5)

## 2021-12-18 NOTE — Assessment & Plan Note (Addendum)
Is trying to cut down. Is trying to remove triggers that get him smoking, he is under a 1/2 PPD. He will let us know if he hopes to have our medical help ?

## 2021-12-18 NOTE — Assessment & Plan Note (Signed)
Well controlled, no changes to meds. Encouraged heart healthy diet such as the DASH diet and exercise as tolerated.  °

## 2021-12-18 NOTE — Assessment & Plan Note (Signed)
Encourage heart healthy diet such as MIND or DASH diet, increase exercise, avoid trans fats, simple carbohydrates and processed foods, consider a krill or fish or flaxseed oil cap daily. Tolerating Atorvastatin 

## 2021-12-18 NOTE — Assessment & Plan Note (Signed)
hgba1c acceptable, minimize simple carbs. Increase exercise as tolerated.  

## 2021-12-18 NOTE — Patient Instructions (Addendum)
Shingrix is the new shingles shot, 2 shots over 2-6 months, confirm coverage with insurance and document, then can return here for shots with nurse appt or at pharmacy ?Low Dose CT scan to rule out lung cancer let us know if you are ready for one ? ?Managing Your Hypertension ?Hypertension, also called high blood pressure, is when the force of the blood pressing against the walls of the arteries is too strong. Arteries are blood vessels that carry blood from your heart throughout your body. Hypertension forces the heart to work harder to pump blood and may cause the arteries to become narrow or stiff. ?Understanding blood pressure readings ?A blood pressure reading includes a higher number over a lower number: ?The first, or top, number is called the systolic pressure. It is a measure of the pressure in your arteries as your heart beats. ?The second, or bottom number, is called the diastolic pressure. It is a measure of the pressure in your arteries as the heart relaxes. ?For most people, a normal blood pressure is below 120/80. Your personal target blood pressure may vary depending on your medical conditions, your age, and other factors. ?Blood pressure is classified into four stages. Based on your blood pressure reading, your health care provider may use the following stages to determine what type of treatment you need, if any. Systolic pressure and diastolic pressure are measured in a unit called millimeters of mercury (mmHg). ?Normal ?Systolic pressure: below 662. ?Diastolic pressure: below 80. ?Elevated ?Systolic pressure: 947-654. ?Diastolic pressure: below 80. ?Hypertension stage 1 ?Systolic pressure: 650-354. ?Diastolic pressure: 65-68. ?Hypertension stage 2 ?Systolic pressure: 127 or above. ?Diastolic pressure: 90 or above. ?How can this condition affect me? ?Managing your hypertension is very important. Over time, hypertension can damage the arteries and decrease blood flow to parts of the body, including  the brain, heart, and kidneys. Having untreated or uncontrolled hypertension can lead to: ?A heart attack. ?A stroke. ?A weakened blood vessel (aneurysm). ?Heart failure. ?Kidney damage. ?Eye damage. ?Memory and concentration problems. ?Vascular dementia. ?What actions can I take to manage this condition? ?Hypertension can be managed by making lifestyle changes and possibly by taking medicines. Your health care provider will help you make a plan to bring your blood pressure within a normal range. You may be referred for counseling on a healthy diet and physical activity. ?Nutrition ? ?Eat a diet that is high in fiber and potassium, and low in salt (sodium), added sugar, and fat. An example eating plan is called the DASH diet. DASH stands for Dietary Approaches to Stop Hypertension. To eat this way: ?Eat plenty of fresh fruits and vegetables. Try to fill one-half of your plate at each meal with fruits and vegetables. ?Eat whole grains, such as whole-wheat pasta, brown rice, or whole-grain bread. Fill about one-fourth of your plate with whole grains. ?Eat low-fat dairy products. ?Avoid fatty cuts of meat, processed or cured meats, and poultry with skin. Fill about one-fourth of your plate with lean proteins such as fish, chicken without skin, beans, eggs, and tofu. ?Avoid pre-made and processed foods. These tend to be higher in sodium, added sugar, and fat. ?Reduce your daily sodium intake. Many people with hypertension should eat less than 1,500 mg of sodium a day. ?Lifestyle ? ?Work with your health care provider to maintain a healthy body weight or to lose weight. Ask what an ideal weight is for you. ?Get at least 30 minutes of exercise that causes your heart to beat faster (aerobic exercise)  most days of the week. Activities may include walking, swimming, or biking. ?Include exercise to strengthen your muscles (resistance exercise), such as weight lifting, as part of your weekly exercise routine. Try to do these  types of exercises for 30 minutes at least 3 days a week. ?Do not use any products that contain nicotine or tobacco. These products include cigarettes, chewing tobacco, and vaping devices, such as e-cigarettes. If you need help quitting, ask your health care provider. ?Control any long-term (chronic) conditions you have, such as high cholesterol or diabetes. ?Identify your sources of stress and find ways to manage stress. This may include meditation, deep breathing, or making time for fun activities. ?Alcohol use ?Do not drink alcohol if: ?Your health care provider tells you not to drink. ?You are pregnant, may be pregnant, or are planning to become pregnant. ?If you drink alcohol: ?Limit how much you have to: ?0-1 drink a day for women. ?0-2 drinks a day for men. ?Know how much alcohol is in your drink. In the [place] one drink equals one 12 oz bottle of beer (355 mL), one 5 oz glass of wine (148 mL), or one 1? oz glass of hard liquor (44 mL). ?Medicines ?Your health care provider may prescribe medicine if lifestyle changes are not enough to get your blood pressure under control and if: ?Your systolic blood pressure is 130 or higher. ?Your diastolic blood pressure is 80 or higher. ?Take medicines only as told by your health care provider. Follow the directions carefully. Blood pressure medicines must be taken as told by your health care provider. The medicine does not work as well when you skip doses. Skipping doses also puts you at risk for problems. ?Monitoring ?Before you monitor your blood pressure: ?Do not smoke, drink caffeinated beverages, or exercise within 30 minutes before taking a measurement. ?Use the bathroom and empty your bladder (urinate). ?Sit quietly for at least 5 minutes before taking measurements. ?Monitor your blood pressure at home as told by your health care provider. To do this: ?Sit with your back straight and supported. ?Place your feet flat on the floor. Do not cross your legs. ?Support  your arm on a flat surface, such as a table. Make sure your upper arm is at heart level. ?Each time you measure, take two or three readings one minute apart and record the results. ?You may also need to have your blood pressure checked regularly by your health care provider. ?General information ?Talk with your health care provider about your diet, exercise habits, and other lifestyle factors that may be contributing to hypertension. ?Review all the medicines you take with your health care provider because there may be side effects or interactions. ?Keep all follow-up visits. Your health care provider can help you create and adjust your plan for managing your high blood pressure. ?Where to find more information ?National Heart, Lung, and Blood Institute: https://wilson-eaton.com/ ?American Heart Association: www.heart.org ?Contact a health care provider if: ?You think you are having a reaction to medicines you have taken. ?You have repeated (recurrent) headaches. ?You feel dizzy. ?You have swelling in your ankles. ?You have trouble with your vision. ?Get help right away if: ?You develop a severe headache or confusion. ?You have unusual weakness or numbness, or you feel faint. ?You have severe pain in your chest or abdomen. ?You vomit repeatedly. ?You have trouble breathing. ?These symptoms may be an emergency. Get help right away. Call 911. ?Do not wait to see if the symptoms will go away. ?Do  not drive yourself to the hospital. ?Summary ?Hypertension is when the force of blood pumping through your arteries is too strong. If this condition is not controlled, it may put you at risk for serious complications. ?Your personal target blood pressure may vary depending on your medical conditions, your age, and other factors. For most people, a normal blood pressure is less than 120/80. ?Hypertension is managed by lifestyle changes, medicines, or both. ?Lifestyle changes to help manage hypertension include losing weight, eating a  healthy, low-sodium diet, exercising more, stopping smoking, and limiting alcohol. ?This information is not intended to replace advice given to you by your health care provider. Make sure you discuss any

## 2022-01-16 ENCOUNTER — Other Ambulatory Visit: Payer: Self-pay | Admitting: Family Medicine

## 2022-01-16 DIAGNOSIS — I6320 Cerebral infarction due to unspecified occlusion or stenosis of unspecified precerebral arteries: Secondary | ICD-10-CM

## 2022-01-16 DIAGNOSIS — I10 Essential (primary) hypertension: Secondary | ICD-10-CM

## 2022-02-23 ENCOUNTER — Other Ambulatory Visit: Payer: Self-pay | Admitting: Family Medicine

## 2022-02-27 DIAGNOSIS — M25511 Pain in right shoulder: Secondary | ICD-10-CM | POA: Diagnosis not present

## 2022-02-27 DIAGNOSIS — R2231 Localized swelling, mass and lump, right upper limb: Secondary | ICD-10-CM | POA: Diagnosis not present

## 2022-03-02 ENCOUNTER — Ambulatory Visit (INDEPENDENT_AMBULATORY_CARE_PROVIDER_SITE_OTHER): Payer: Medicare HMO | Admitting: Family

## 2022-03-02 ENCOUNTER — Ambulatory Visit (HOSPITAL_BASED_OUTPATIENT_CLINIC_OR_DEPARTMENT_OTHER)
Admission: RE | Admit: 2022-03-02 | Discharge: 2022-03-02 | Disposition: A | Discharge status: Home / Self Care | Payer: Medicare HMO | Source: Ambulatory Visit | Attending: Family | Admitting: Family

## 2022-03-02 VITALS — BP 142/60 | HR 91 | Temp 97.7°F | Resp 16 | Wt 159.0 lb

## 2022-03-02 DIAGNOSIS — R222 Localized swelling, mass and lump, trunk: Secondary | ICD-10-CM | POA: Diagnosis not present

## 2022-03-02 NOTE — Assessment & Plan Note (Addendum)
New. Due to spontaneous nature of this mass, I suspect hematoma.  Less likely lipoma. Will begin with US of the area for further evaluation. Pt is advised as follows:  Please schedule your ultrasound downstairs on the first floor. Call if increased pain, swelling, redness of fever.  It sounds like he was given toradol and a muscle relaxer from the urgent care- ok to continue prn. If less severe pain, recommended tylenol. I told him I think he can discontinue the prednisone.

## 2022-03-02 NOTE — Patient Instructions (Signed)
Please schedule your ultrasound downstairs on the first floor. Call if increased pain, swelling, redness of fever.

## 2022-03-02 NOTE — Progress Notes (Addendum)
Subjective:   By signing my name below, I, Shehryar Baig, attest that this documentation has been prepared under the direction and in the presence of Debbrah Alar, NP. 03/02/2022    Patient ID: Ray Williams, male    DOB: 03-03-1951, 71 y.o.   MRN: [id number]  Chief Complaint  Patient presents with   Mass    Patient reports a mass on his upper back on the right side    HPI Patient is in today for a ED follow up visit.   Painful mass- He complains of a painful mass on his back. He reports a couple of days ago he bent down to pick up something and felt a muscle pull in his right upper back. His pain started the next day. His wife reports that his pain worsened which ultimately led him go to the urgent care near his home. His wife also reports he had an X-ray taken but they were not told the results yet. Urgen care provider advised him to follow up with PCP and gave him a pain medication that "starts with K" prednisone, and muscle relaxer. He is supposed to take 2 prednisone's daily but is only taking 1 so his sleep is not disturbed.    Health Maintenance Due  Topic Date Due   Zoster Vaccines- Shingrix (1 of 2) Never done   Pneumonia Vaccine 81+ Years old (25 - PPSV23 or PCV20) 03/31/2021    Past Medical History:  Diagnosis Date   Anemia 01/26/2018   Anxiety    Depression    Depression with anxiety 01/08/2016   H/O ischemic right MCA stroke    History of chicken pox 05/11/2017   Hyperlipidemia    Hypertension    Liver disease    hepatitis c   Right shoulder pain 05/11/2017   Stroke [facility])     Past Surgical History:  Procedure Laterality Date   CATARACT EXTRACTION, BILATERAL  2022   EYE SURGERY Bilateral 2021   cataracts    Family History  Problem Relation Age of Onset   Heart disease Mother        MI   Heart attack Mother    Asthma Mother    Hyperlipidemia Mother    Hypertension Mother    Diabetes Father    Healthy Maternal Grandmother    Healthy Maternal  Grandfather    Healthy Paternal Grandmother    Healthy Paternal Grandfather    Hyperlipidemia Sister     Social History   Socioeconomic History   Marital status: Married    Spouse name: Not on file   Number of children: 2   Years of education: 12   Highest education level: Not on file  Occupational History   Occupation: Teaching laboratory technician  Tobacco Use   Smoking status: Every Day    Packs/day: 0.50    Types: Cigarettes   Smokeless tobacco: Never  Substance and Sexual Activity   Alcohol use: No    Alcohol/week: 0.0 standard drinks of alcohol   Drug use: No   Sexual activity: Not Currently  Other Topics Concern   Not on file  Social History Narrative   Fun: Fish, collectable cars/classic cars   Denies religious beliefs effecting health care   Retired from Therapist, occupational   Lives with wife   No dietary restrictions, wears seat belt.    Social Determinants of Health   Financial Resource Strain: Low Risk  (10/27/2021)   Overall Financial Resource Strain (CARDIA)    Difficulty of  Paying Living Expenses: Not hard at all  Food Insecurity: No Food Insecurity (10/27/2021)   Hunger Vital Sign    Worried About Running Out of Food in the Last Year: Never true    Ran Out of Food in the Last Year: Never true  Transportation Needs: No Transportation Needs (10/27/2021)   PRAPARE - Hydrologist (Medical): No    Lack of Transportation (Non-Medical): No  Physical Activity: Inactive (10/27/2021)   Exercise Vital Sign    Days of Exercise per Week: 0 days    Minutes of Exercise per Session: 0 min  Stress: No Stress Concern Present (10/27/2021)   [place]    Feeling of Stress : Not at all  Social Connections: Moderately Isolated (10/27/2021)   Social Connection and Isolation Panel [NHANES]    Frequency of Communication with Friends and Family: More than three times a week    Frequency  of Social Gatherings with Friends and Family: More than three times a week    Attends Religious Services: Never    Marine scientist or Organizations: No    Attends Archivist Meetings: Never    Marital Status: Married  Human resources officer Violence: Not At Risk (10/27/2021)   Humiliation, Afraid, Rape, and Kick questionnaire    Fear of Current or Ex-Partner: No    Emotionally Abused: No    Physically Abused: No    Sexually Abused: No    Outpatient Medications Prior to Visit  Medication Sig Dispense Refill   atorvastatin (LIPITOR) 80 MG tablet Take 1 tablet by mouth once daily 30 tablet 0   citalopram (CELEXA) 20 MG tablet Take 1 tablet by mouth once daily 30 tablet 0   clopidogrel (PLAVIX) 75 MG tablet Take 1 tablet by mouth once daily 90 tablet 1   lisinopril-hydrochlorothiazide (ZESTORETIC) 20-25 MG tablet Take 1 tablet by mouth once daily 90 tablet 1   No facility-administered medications prior to visit.    No Known Allergies  Review of Systems  Musculoskeletal:  Positive for back pain (on).  Skin:        (+)painful mass under skin on lower back       Objective:    Physical Exam Constitutional:      General: He is not in acute distress.    Appearance: Normal appearance. He is not ill-appearing.  HENT:     Head: Normocephalic and atraumatic.     Right Ear: External ear normal.     Left Ear: External ear normal.  Eyes:     Extraocular Movements: Extraocular movements intact.     Pupils: Pupils are equal, round, and reactive to light.  Cardiovascular:     Rate and Rhythm: Normal rate and regular rhythm.  Pulmonary:     Effort: Pulmonary effort is normal.  Musculoskeletal:     Comments: Approximately 5 in wide mass noted on right upper back. Firm, mildly tender  Skin:    General: Skin is warm and dry.  Neurological:     Mental Status: He is alert and oriented to person, place, and time.  Psychiatric:        Judgment: Judgment normal.     BP (!)  142/60 (BP Location: Right Arm, Patient Position: Sitting, Cuff Size: Small)   Pulse 91   Temp 97.7 F (36.5 C) (Oral)   Resp 16   Wt 159 lb (72.1 kg)   SpO2 99%   BMI  25.66 kg/m  Wt Readings from Last 3 Encounters:  03/02/22 159 lb (72.1 kg)  12/18/21 155 lb (70.3 kg)  10/27/21 159 lb (72.1 kg)       Assessment & Plan:   Problem List Items Addressed This Visit       Unprioritized   Mass of subcutaneous tissue of back - Primary    New. Due to spontaneous nature of this mass, I suspect hematoma.  Less likely lipoma. Will begin with US of the area for further evaluation. Pt is advised as follows:  Please schedule your ultrasound downstairs on the first floor. Call if increased pain, swelling, redness of fever.  It sounds like he was given toradol and a muscle relaxer from the urgent care- ok to continue prn. If less severe pain, recommended tylenol. I told him I think he can discontinue the prednisone.       Relevant Orders   [place] MiscellaneoUS Localization     No orders of the defined types were placed in this encounter.   I, Nance Pear, NP, personally preformed the services described in this documentation.  All medical record entries made by the scribe were at my direction and in my presence.  I have reviewed the chart and discharge instructions (if applicable) and agree that the record reflects my personal performance and is accurate and complete. 03/02/2022   I,Shehryar Baig,acting as a Education administrator for Nance Pear, NP.,have documented all relevant documentation on the behalf of Nance Pear, NP,as directed by  Nance Pear, NP while in the presence of Nance Pear, NP.   Nance Pear, NP

## 2022-03-03 ENCOUNTER — Telehealth: Payer: Self-pay | Admitting: Family

## 2022-03-03 NOTE — Telephone Encounter (Signed)
Patient advised of results and scheduled to return on 03/20/2022

## 2022-03-03 NOTE — Telephone Encounter (Signed)
Please contact pt and let him know that ultrasound suggests that the mass is a hematoma (blood collection).  I would recommend that he follow up in 2-3 weeks for re-evaluation.

## 2022-03-05 ENCOUNTER — Telehealth: Payer: Self-pay | Admitting: Family Medicine

## 2022-03-05 DIAGNOSIS — T148XXA Other injury of unspecified body region, initial encounter: Secondary | ICD-10-CM

## 2022-03-05 NOTE — Telephone Encounter (Signed)
Pt wife called and was advised she stated she understand and would like the referral to be put in please.

## 2022-03-05 NOTE — Telephone Encounter (Signed)
Pt called stating he had a few questions regarding his ED and would like to speak to either Lenna Sciara or Rod Holler regarding these.

## 2022-03-05 NOTE — Telephone Encounter (Signed)
I tried both his cell and his home phone- no answer.  Do you mind trying him later this afternoon please?   In regards to the hematoma- I would like to refer him to a general surgeon for consultation. I am not sure that they would recommend draining the hematoma, but I would like to at least get their opinion.    In regards to the pain, I would recommend that you add tylenol '1000mg'$  every 8 hours. You may also ice the area as needed.    These types of hematomas typically take at least 4-6 weeks for the body to re-absorb the blood.   What ED questions does he have?

## 2022-03-06 NOTE — Telephone Encounter (Signed)
Referral was entered yesterday

## 2022-03-19 ENCOUNTER — Other Ambulatory Visit: Payer: Self-pay | Admitting: Family Medicine

## 2022-03-19 NOTE — Telephone Encounter (Signed)
Patient wife calling to check on refills & to let us know   Celexa & Lipitor   [facility] [place]

## 2022-03-20 ENCOUNTER — Ambulatory Visit (INDEPENDENT_AMBULATORY_CARE_PROVIDER_SITE_OTHER): Payer: Medicare HMO | Admitting: Family

## 2022-03-20 ENCOUNTER — Telehealth: Payer: Self-pay | Admitting: Family

## 2022-03-20 VITALS — BP 106/45 | HR 66 | Temp 98.6°F | Resp 16 | Wt 155.0 lb

## 2022-03-20 DIAGNOSIS — D649 Anemia, unspecified: Secondary | ICD-10-CM

## 2022-03-20 DIAGNOSIS — T148XXA Other injury of unspecified body region, initial encounter: Secondary | ICD-10-CM

## 2022-03-20 DIAGNOSIS — R739 Hyperglycemia, unspecified: Secondary | ICD-10-CM

## 2022-03-20 DIAGNOSIS — E782 Mixed hyperlipidemia: Secondary | ICD-10-CM

## 2022-03-20 DIAGNOSIS — F418 Other specified anxiety disorders: Secondary | ICD-10-CM

## 2022-03-20 DIAGNOSIS — I10 Essential (primary) hypertension: Secondary | ICD-10-CM | POA: Diagnosis not present

## 2022-03-20 DIAGNOSIS — E538 Deficiency of other specified B group vitamins: Secondary | ICD-10-CM | POA: Insufficient documentation

## 2022-03-20 DIAGNOSIS — R69 Illness, unspecified: Secondary | ICD-10-CM | POA: Diagnosis not present

## 2022-03-20 LAB — BASIC METABOLIC PANEL
BUN: 24 mg/dL — ABNORMAL HIGH (ref 6–23)
CO2: 26 mEq/L (ref 19–32)
Calcium: 9.2 mg/dL (ref 8.4–10.5)
Chloride: 104 mEq/L (ref 96–112)
Creatinine, Ser: 1.4 mg/dL (ref 0.40–1.50)
GFR: 51.11 mL/min — ABNORMAL LOW (ref >60.00)
Glucose, Bld: 103 mg/dL — ABNORMAL HIGH (ref 70–99)
Potassium: 4.7 mEq/L (ref 3.5–5.1)
Sodium: 137 mEq/L (ref 135–145)

## 2022-03-20 LAB — CBC WITH DIFFERENTIAL/PLATELET
Basophils Absolute: 0 10*3/uL (ref 0.0–0.1)
Basophils Relative: 0.9 % (ref 0.0–3.0)
Eosinophils Absolute: 0.3 10*3/uL (ref 0.0–0.7)
Eosinophils Relative: 5 % (ref 0.0–5.0)
HCT: 33.3 % — ABNORMAL LOW (ref 39.0–52.0)
Hemoglobin: 10.9 g/dL — ABNORMAL LOW (ref 13.0–17.0)
Lymphocytes Relative: 17.4 % (ref 12.0–46.0)
Lymphs Abs: 1 10*3/uL (ref 0.7–4.0)
MCHC: 32.8 g/dL (ref 30.0–36.0)
MCV: 91.5 fl (ref 78.0–100.0)
Monocytes Absolute: 0.4 10*3/uL (ref 0.1–1.0)
Monocytes Relative: 6.9 % (ref 3.0–12.0)
Neutro Abs: 4 10*3/uL (ref 1.4–7.7)
Neutrophils Relative %: 69.8 % (ref 43.0–77.0)
Platelets: 284 10*3/uL (ref 150.0–400.0)
RBC: 3.64 Mil/uL — ABNORMAL LOW (ref 4.22–5.81)
RDW: 14.8 % (ref 11.5–15.5)
WBC: 5.7 10*3/uL (ref 4.0–10.5)

## 2022-03-20 LAB — VITAMIN B12: Vitamin B-12: 191 pg/mL — ABNORMAL LOW (ref 211–911)

## 2022-03-20 LAB — FERRITIN: Ferritin: 157.5 ng/mL (ref 22.0–322.0)

## 2022-03-20 LAB — HEMOGLOBIN A1C: Hgb A1c MFr Bld: 6 % (ref 4.6–6.5)

## 2022-03-20 LAB — IRON: Iron: 81 ug/dL (ref 42–165)

## 2022-03-20 MED ORDER — ATORVASTATIN CALCIUM 80 MG PO TABS: 80.0000 mg | ORAL_TABLET | Freq: Every day | ORAL | 1 refills | Status: DC

## 2022-03-20 MED ORDER — CITALOPRAM HYDROBROMIDE 20 MG PO TABS: 20.0000 mg | ORAL_TABLET | Freq: Every day | ORAL | 1 refills | Status: DC

## 2022-03-20 NOTE — Assessment & Plan Note (Signed)
BP Readings from Last 3 Encounters:  03/20/22 (!) 106/45  03/02/22 (!) 142/60  12/18/21 130/80   Stable on lisinopril hctz. Continue same.

## 2022-03-20 NOTE — Assessment & Plan Note (Signed)
Lab Results  Component Value Date   CHOL 119 12/18/2021   HDL 32.30 (L) 12/18/2021   LDLCALC 64 12/18/2021   LDLDIRECT 60.0 01/24/2018   TRIG 114.0 12/18/2021   CHOLHDL 4 12/18/2021

## 2022-03-20 NOTE — Assessment & Plan Note (Signed)
Reports stable on citalopram. Continue same.

## 2022-03-20 NOTE — Assessment & Plan Note (Addendum)
Lab Results  Component Value Date   WBC 7.2 12/18/2021   HGB 12.6 (L) 12/18/2021   HCT 37.4 (L) 12/18/2021   MCV 89.6 12/18/2021   PLT 261.0 12/18/2021   Patient has had some chronic mild anemia for several years.  Negative cologuard. Will obtain labs as ordered.

## 2022-03-20 NOTE — Telephone Encounter (Signed)
B12 is low. This is likely contributing to his anemia. I would like him to complete an IFOB and begin b12 injections 1029mg IM weekly for 4 weeks then monthly.   Repeat cbc and b12 in 6 weeks. Dx anemia.  Kidney function is mildly reduced but stable.   Sugar is in borderline diabetes range but stable.

## 2022-03-20 NOTE — Progress Notes (Signed)
Subjective:   By signing my name below, I, Ray Williams, attest that this documentation has been prepared under the direction and in the presence of Ray Chimera, NP 03/20/2022   Patient ID: Ray Williams, male    DOB: [birth year], 71 y.o.   MRN: [id number]  Chief Complaint  Patient presents with   Hypertension    Here for follow up    HPI Patient is in today for an office visit   Mass: He reports that the swelling in his back is nearly resolved.  Blood Pressure: His blood pressure is normal. He is currently taking 20-25 Mg of Zestoretic BP Readings from Last 3 Encounters:  03/20/22 (!) 106/45  03/02/22 (!) 142/60  12/18/21 130/80   Pulse Readings from Last 3 Encounters:  03/20/22 66  03/02/22 91  12/18/21 85   Depression/Anxiety: He reports that his symptoms are controlled. He is currently taking 20 Mg of Citalopram.  Blood Count: He is slightly anemic.  Cholesterol: His cholesterol levels are normal. He is currently taking 80 Mg of Atorvastatin.  Lab Results  Component Value Date   CHOL 119 12/18/2021   HDL 32.30 (L) 12/18/2021   LDLCALC 64 12/18/2021   LDLDIRECT 60.0 01/24/2018   TRIG 114.0 12/18/2021   CHOLHDL 4 12/18/2021   HepC: He states that he is cleared and has been years since his initial diagnosis.  A1C: His A1C levels are slightly elevating Lab Results  Component Value Date   HGBA1C 6.1 12/18/2021   Health Maintenance Due  Topic Date Due   Zoster Vaccines- Shingrix (1 of 2) Never done   Pneumonia Vaccine 68+ Years old (2 - PPSV23 or PCV20) 03/31/2021   INFLUENZA VACCINE  03/10/2022    Past Medical History:  Diagnosis Date   Anemia 01/26/2018   Anxiety    Depression    Depression with anxiety 01/08/2016   H/O ischemic right MCA stroke    History of chicken pox 05/11/2017   Hyperlipidemia    Hypertension    Liver disease    hepatitis c   Right shoulder pain 05/11/2017   Stroke [facility])     Past Surgical History:  Procedure  Laterality Date   CATARACT EXTRACTION, BILATERAL  2022   EYE SURGERY Bilateral 2021   cataracts    Family History  Problem Relation Age of Onset   Heart disease Mother        MI   Heart attack Mother    Asthma Mother    Hyperlipidemia Mother    Hypertension Mother    Diabetes Father    Healthy Maternal Grandmother    Healthy Maternal Grandfather    Healthy Paternal Grandmother    Healthy Paternal Grandfather    Hyperlipidemia Sister     Social History   Socioeconomic History   Marital status: Married    Spouse name: Not on file   Number of children: 2   Years of education: 12   Highest education level: Not on file  Occupational History   Occupation: Teaching laboratory technician  Tobacco Use   Smoking status: Every Day    Packs/day: 0.50    Types: Cigarettes   Smokeless tobacco: Never  Substance and Sexual Activity   Alcohol use: No    Alcohol/week: 0.0 standard drinks of alcohol   Drug use: No   Sexual activity: Not Currently  Other Topics Concern   Not on file  Social History Narrative   Fun: Fish, collectable cars/classic cars   Denies religious beliefs  effecting health care   Retired from Therapist, occupational   Lives with wife   No dietary restrictions, wears seat belt.    Social Determinants of Health   Financial Resource Strain: Low Risk  (10/27/2021)   Overall Financial Resource Strain (CARDIA)    Difficulty of Paying Living Expenses: Not hard at all  Food Insecurity: No Food Insecurity (10/27/2021)   Hunger Vital Sign    Worried About Running Out of Food in the Last Year: Never true    Ran Out of Food in the Last Year: Never true  Transportation Needs: No Transportation Needs (10/27/2021)   PRAPARE - Hydrologist (Medical): No    Lack of Transportation (Non-Medical): No  Physical Activity: Inactive (10/27/2021)   Exercise Vital Sign    Days of Exercise per Week: 0 days    Minutes of Exercise per Session: 0 min  Stress: No  Stress Concern Present (10/27/2021)   [place]    Feeling of Stress : Not at all  Social Connections: Moderately Isolated (10/27/2021)   Social Connection and Isolation Panel [NHANES]    Frequency of Communication with Friends and Family: More than three times a week    Frequency of Social Gatherings with Friends and Family: More than three times a week    Attends Religious Services: Never    Marine scientist or Organizations: No    Attends Archivist Meetings: Never    Marital Status: Married  Human resources officer Violence: Not At Risk (10/27/2021)   Humiliation, Afraid, Rape, and Kick questionnaire    Fear of Current or Ex-Partner: No    Emotionally Abused: No    Physically Abused: No    Sexually Abused: No    Outpatient Medications Prior to Visit  Medication Sig Dispense Refill   clopidogrel (PLAVIX) 75 MG tablet Take 1 tablet by mouth once daily 90 tablet 1   lisinopril-hydrochlorothiazide (ZESTORETIC) 20-25 MG tablet Take 1 tablet by mouth once daily 90 tablet 1   atorvastatin (LIPITOR) 80 MG tablet Take 1 tablet by mouth once daily 30 tablet 0   citalopram (CELEXA) 20 MG tablet Take 1 tablet by mouth once daily 30 tablet 0   No facility-administered medications prior to visit.    No Known Allergies  ROS    See HPI Objective:    Physical Exam Constitutional:      General: He is not in acute distress.    Appearance: Normal appearance. He is not ill-appearing.  HENT:     Head: Normocephalic and atraumatic.     Right Ear: External ear normal.     Left Ear: External ear normal.  Eyes:     Extraocular Movements: Extraocular movements intact.     Pupils: Pupils are equal, round, and reactive to light.  Cardiovascular:     Rate and Rhythm: Normal rate and regular rhythm.     Heart sounds: Normal heart sounds. No murmur heard.    No gallop.  Pulmonary:     Effort: Pulmonary effort is normal.  No respiratory distress.     Breath sounds: Normal breath sounds. No wheezing or rales.  Skin:    General: Skin is warm and dry.     Comments: Resolution of hematoma under back of skeletal muscle   Neurological:     Mental Status: He is alert and oriented to person, place, and time.  Psychiatric:  Mood and Affect: Mood normal.        Behavior: Behavior normal.        Judgment: Judgment normal.     BP (!) 106/45 (BP Location: Right Arm, Patient Position: Sitting, Cuff Size: Small)   Pulse 66   Temp 98.6 F (37 C) (Oral)   Resp 16   Wt 155 lb (70.3 kg)   SpO2 100%   BMI 25.02 kg/m  Wt Readings from Last 3 Encounters:  03/20/22 155 lb (70.3 kg)  03/02/22 159 lb (72.1 kg)  12/18/21 155 lb (70.3 kg)       Assessment & Plan:   Problem List Items Addressed This Visit       Unprioritized   Hyperglycemia - Primary   Relevant Orders   Hemoglobin A1c   HLD (hyperlipidemia)    Lab Results  Component Value Date   CHOL 119 12/18/2021   HDL 32.30 (L) 12/18/2021   LDLCALC 64 12/18/2021   LDLDIRECT 60.0 01/24/2018   TRIG 114.0 12/18/2021   CHOLHDL 4 12/18/2021        Relevant Medications   atorvastatin (LIPITOR) 80 MG tablet   Hematoma    Clinically resolved.       Essential hypertension    BP Readings from Last 3 Encounters:  03/20/22 (!) 106/45  03/02/22 (!) 142/60  12/18/21 130/80  Stable on lisinopril hctz. Continue same.       Relevant Medications   atorvastatin (LIPITOR) 80 MG tablet   Other Relevant Orders   Basic metabolic panel   Depression with anxiety    Reports stable on citalopram. Continue same.       Relevant Medications   citalopram (CELEXA) 20 MG tablet   Anemia    Lab Results  Component Value Date   WBC 7.2 12/18/2021   HGB 12.6 (L) 12/18/2021   HCT 37.4 (L) 12/18/2021   MCV 89.6 12/18/2021   PLT 261.0 12/18/2021  Patient has had some chronic mild anemia for several years.  Negative cologuard. Will obtain labs as ordered.         Relevant Orders   CBC with Differential/Platelet   Iron   Ferritin   B12   Meds ordered this encounter  Medications   citalopram (CELEXA) 20 MG tablet    Sig: Take 1 tablet (20 mg total) by mouth daily.    Dispense:  90 tablet    Refill:  1    Order Specific Question:   Supervising Provider    Answer:   Penni Homans A [[id number]]   atorvastatin (LIPITOR) 80 MG tablet    Sig: Take 1 tablet (80 mg total) by mouth daily.    Dispense:  90 tablet    Refill:  1    Order Specific Question:   Supervising Provider    Answer:   Penni Homans A [[id number]]    I, Nance Pear, NP, personally preformed the services described in this documentation.  All medical record entries made by the scribe were at my direction and in my presence.  I have reviewed the chart and discharge instructions (if applicable) and agree that the record reflects my personal performance and is accurate and complete. 03/20/2022   I,Amber Collins,acting as a Education administrator for Nance Pear, NP.,have documented all relevant documentation on the behalf of Nance Pear, NP,as directed by  Nance Pear, NP while in the presence of Nance Pear, NP.    Nance Pear, NP

## 2022-03-20 NOTE — Assessment & Plan Note (Signed)
Clinically resolved.  

## 2022-03-23 NOTE — Telephone Encounter (Signed)
Patient advised of all results and provider's comments.  He will call for nurse visit appointment to do initial B12 injection 1 of 4 weekly, he is going on vacation and will call when he returns. He will also need to pick up the IFOB kit (this was left at front office for patient).   He will also need to schedule labs 6 weeks from the time he starts B12 injections. Orders placed as future.  

## 2022-03-30 ENCOUNTER — Ambulatory Visit (INDEPENDENT_AMBULATORY_CARE_PROVIDER_SITE_OTHER): Payer: Medicare HMO

## 2022-03-30 DIAGNOSIS — E538 Deficiency of other specified B group vitamins: Secondary | ICD-10-CM | POA: Diagnosis not present

## 2022-03-30 MED ORDER — CYANOCOBALAMIN 1000 MCG/ML IJ SOLN
1000.0000 ug | Freq: Once | INTRAMUSCULAR | Status: AC
  Administered 2022-03-30: 1000 ug via INTRAMUSCULAR

## 2022-03-30 NOTE — Progress Notes (Signed)
Pt here for week 1 of 4 B12 injection per Melissa  B12 1067mg given L deltoid IM, and pt tolerated injection well.  Next B12 injection scheduled for 1 week.

## 2022-03-31 ENCOUNTER — Ambulatory Visit: Payer: Medicare HMO

## 2022-04-06 ENCOUNTER — Telehealth: Payer: Self-pay | Admitting: Family

## 2022-04-06 NOTE — Telephone Encounter (Signed)
See mychart.  

## 2022-04-07 ENCOUNTER — Ambulatory Visit (INDEPENDENT_AMBULATORY_CARE_PROVIDER_SITE_OTHER): Payer: Medicare HMO

## 2022-04-07 DIAGNOSIS — E538 Deficiency of other specified B group vitamins: Secondary | ICD-10-CM

## 2022-04-07 MED ORDER — CYANOCOBALAMIN 1000 MCG/ML IJ SOLN
1000.0000 ug | Freq: Once | INTRAMUSCULAR | Status: AC
  Administered 2022-04-07: 1000 ug via INTRAMUSCULAR

## 2022-04-07 NOTE — Progress Notes (Signed)
Ray Williams is a 71 y.o. male presents to the office today for 2/4 Weekly B12 injection  per physician's orders. Original order: 03/20/22: " Begin b12 injections 1068mg IM weekly for 4 weeks then monthly. Repeat cbc and b12 in 6 weeks. Dx anemia."  B12 10050m given IM administered L (location) today. Patient tolerated injection. Patient due for follow up labs/provider appt: No.  Patient next injection due: 1 week , appt made Yes  [place], TiKristine Garbe

## 2022-04-15 ENCOUNTER — Ambulatory Visit (INDEPENDENT_AMBULATORY_CARE_PROVIDER_SITE_OTHER): Payer: Medicare HMO

## 2022-04-15 DIAGNOSIS — E538 Deficiency of other specified B group vitamins: Secondary | ICD-10-CM

## 2022-04-15 MED ORDER — CYANOCOBALAMIN 1000 MCG/ML IJ SOLN
1000.0000 ug | INTRAMUSCULAR | Status: DC
  Administered 2022-04-15: 1000 ug via INTRAMUSCULAR

## 2022-04-15 NOTE — Progress Notes (Signed)
Pt here for monthly B12 injection per Melissa  B12 1025mg given IM, and pt tolerated injection well.  Next B12 injection scheduled for 04/23/22

## 2022-04-21 ENCOUNTER — Ambulatory Visit (INDEPENDENT_AMBULATORY_CARE_PROVIDER_SITE_OTHER): Payer: Medicare HMO | Admitting: Family

## 2022-04-21 VITALS — BP 125/67 | HR 87 | Temp 97.7°F | Resp 16 | Wt 154.0 lb

## 2022-04-21 DIAGNOSIS — D649 Anemia, unspecified: Secondary | ICD-10-CM

## 2022-04-21 DIAGNOSIS — M25511 Pain in right shoulder: Secondary | ICD-10-CM | POA: Diagnosis not present

## 2022-04-21 DIAGNOSIS — E538 Deficiency of other specified B group vitamins: Secondary | ICD-10-CM | POA: Diagnosis not present

## 2022-04-21 MED ORDER — CYANOCOBALAMIN 1000 MCG/ML IJ SOLN
1000.0000 ug | Freq: Once | INTRAMUSCULAR | Status: AC
  Administered 2022-04-21: 1000 ug via INTRAMUSCULAR

## 2022-04-21 MED ORDER — CYANOCOBALAMIN 1000 MCG/ML IJ SOLN: 1000.0000 ug | Freq: Once | INTRAMUSCULAR | 0 refills | Status: DC

## 2022-04-21 NOTE — Assessment & Plan Note (Signed)
Recommend topical voltaren gel prn, referral to sports medicine, tylenol prn.

## 2022-04-21 NOTE — Patient Instructions (Addendum)
You can apply voltaren gel twice daily as needed to affected shoulder.   It is OK for you to continue tylenol as needed for pain.

## 2022-04-21 NOTE — Assessment & Plan Note (Signed)
Continue b12 injections. Injection today.

## 2022-04-21 NOTE — Assessment & Plan Note (Signed)
Encouraged him to complete IFOB asap.  Will plan referral to GI after this is reviewed for screening versus diagnostic colo.

## 2022-04-21 NOTE — Progress Notes (Addendum)
Subjective:   By signing my name below, I, Ray Williams, attest that this documentation has been prepared under the direction and in the presence of Ray Creek, NP 04/21/2022   Patient ID: Ray Williams, male    DOB: 09/14/1950, 71 y.o.   MRN: [id number]  Chief Complaint  Patient presents with   Shoulder Pain    Patient complains of right shoulder pain   B12 deficiency    Here for B12 shot today    HPI Patient is in today for an office visit. He is accompanied by an additional person.   Right Shoulder Pain: He complains of right shoulder pain. The pain radiates from his right shoulder to the right side of his neck. He takes two extra strength tylenol's to alleviate symptoms. He had right shoulder pain in the past but symptoms have worsened as of recently.  Stool Kit: He reports that he has his stool collection kit at home. He has not had a colonoscopy.  Vitamin B12 Injections: His accompany does not see a difference of the patient since receiving the B12 injections  Health Maintenance Due  Topic Date Due   Zoster Vaccines- Shingrix (1 of 2) Never done   Pneumonia Vaccine 65+ Years old (3 - PPSV23 or PCV20) 03/31/2021   INFLUENZA VACCINE  03/10/2022    Past Medical History:  Diagnosis Date   Anemia 01/26/2018   Anxiety    Depression    Depression with anxiety 01/08/2016   H/O ischemic right MCA stroke    History of chicken pox 05/11/2017   Hyperlipidemia    Hypertension    Liver disease    hepatitis c   Right shoulder pain 05/11/2017   Stroke [facility])     Past Surgical History:  Procedure Laterality Date   CATARACT EXTRACTION, BILATERAL  2022   EYE SURGERY Bilateral 2021   cataracts    Family History  Problem Relation Age of Onset   Heart disease Mother        MI   Heart attack Mother    Asthma Mother    Hyperlipidemia Mother    Hypertension Mother    Diabetes Father    Healthy Maternal Grandmother    Healthy Maternal Grandfather    Healthy  Paternal Grandmother    Healthy Paternal Grandfather    Hyperlipidemia Sister     Social History   Socioeconomic History   Marital status: Married    Spouse name: Not on file   Number of children: 2   Years of education: 12   Highest education level: Not on file  Occupational History   Occupation: Teaching laboratory technician  Tobacco Use   Smoking status: Every Day    Packs/day: 0.50    Types: Cigarettes   Smokeless tobacco: Never  Substance and Sexual Activity   Alcohol use: No    Alcohol/week: 0.0 standard drinks of alcohol   Drug use: No   Sexual activity: Not Currently  Other Topics Concern   Not on file  Social History Narrative   Fun: Fish, collectable cars/classic cars   Denies religious beliefs effecting health care   Retired from Therapist, occupational   Lives with wife   No dietary restrictions, wears seat belt.    Social Determinants of Health   Financial Resource Strain: Low Risk  (10/27/2021)   Overall Financial Resource Strain (CARDIA)    Difficulty of Paying Living Expenses: Not hard at all  Food Insecurity: No Food Insecurity (10/27/2021)   Hunger Vital  Sign    Worried About Programme researcher, broadcasting/film/video in the Last Year: Never true    Ran Out of Food in the Last Year: Never true  Transportation Needs: No Transportation Needs (10/27/2021)   PRAPARE - Administrator, Civil Service (Medical): No    Lack of Transportation (Non-Medical): No  Physical Activity: Inactive (10/27/2021)   Exercise Vital Sign    Days of Exercise per Week: 0 days    Minutes of Exercise per Session: 0 min  Stress: No Stress Concern Present (10/27/2021)   [employer] of Occupational Health - Occupational Stress Questionnaire    Feeling of Stress : Not at all  Social Connections: Moderately Isolated (10/27/2021)   Social Connection and Isolation Panel [NHANES]    Frequency of Communication with Friends and Family: More than three times a week    Frequency of Social Gatherings with  Friends and Family: More than three times a week    Attends Religious Services: Never    Database administrator or Organizations: No    Attends Banker Meetings: Never    Marital Status: Married  Catering manager Violence: Not At Risk (10/27/2021)   Humiliation, Afraid, Rape, and Kick questionnaire    Fear of Current or Ex-Partner: No    Emotionally Abused: No    Physically Abused: No    Sexually Abused: No    Outpatient Medications Prior to Visit  Medication Sig Dispense Refill   atorvastatin (LIPITOR) 80 MG tablet Take 1 tablet (80 mg total) by mouth daily. 90 tablet 1   citalopram (CELEXA) 20 MG tablet Take 1 tablet (20 mg total) by mouth daily. 90 tablet 1   clopidogrel (PLAVIX) 75 MG tablet Take 1 tablet by mouth once daily 90 tablet 1   lisinopril-hydrochlorothiazide (ZESTORETIC) 20-25 MG tablet Take 1 tablet by mouth once daily 90 tablet 1   Facility-Administered Medications Prior to Visit  Medication Dose Route Frequency Provider Last Rate Last Admin   cyanocobalamin (VITAMIN B12) injection 1,000 mcg  1,000 mcg Intramuscular Q30 days Bradd Canary, MD   1,000 mcg at 04/15/22 4585    No Known Allergies  Review of Systems  Musculoskeletal:  Positive for joint pain (Right Shoulder Pain).       Objective:    Physical Exam Constitutional:      General: He is not in acute distress.    Appearance: Normal appearance. He is not ill-appearing.  HENT:     Head: Normocephalic and atraumatic.     Right Ear: External ear normal.     Left Ear: External ear normal.  Eyes:     Extraocular Movements: Extraocular movements intact.     Pupils: Pupils are equal, round, and reactive to light.  Cardiovascular:     Rate and Rhythm: Normal rate and regular rhythm.     Heart sounds: Normal heart sounds. No murmur heard.    No gallop.  Pulmonary:     Effort: Pulmonary effort is normal. No respiratory distress.     Breath sounds: Normal breath sounds. No wheezing or  rales.  Musculoskeletal:     Comments: + pain with flexion of right shoulder.  + tenderness to palpation above right scapula No swelling  Skin:    General: Skin is warm and dry.  Neurological:     Mental Status: He is alert and oriented to person, place, and time.  Psychiatric:        Mood and Affect: Mood normal.  Behavior: Behavior normal.        Judgment: Judgment normal.     BP 125/67 (BP Location: Right Arm, Patient Position: Sitting, Cuff Size: Small)   Pulse 87   Temp 97.7 F (36.5 C) (Oral)   Resp 16   Wt 154 lb (69.9 kg)   SpO2 100%   BMI 24.86 kg/m  Wt Readings from Last 3 Encounters:  04/21/22 154 lb (69.9 kg)  03/20/22 155 lb (70.3 kg)  03/02/22 159 lb (72.1 kg)       Assessment & Plan:   Problem List Items Addressed This Visit       Unprioritized   Right shoulder pain - Primary    Recommend topical voltaren gel prn, referral to sports medicine, tylenol prn.       Relevant Orders   Ambulatory referral to Sports Medicine   B12 deficiency    Continue b12 injections. Injection today.       Anemia    Encouraged him to complete IFOB asap.  Will plan referral to GI after this is reviewed for screening versus diagnostic colo.       Meds ordered this encounter  Medications   DISCONTD: cyanocobalamin (VITAMIN B12) 1000 MCG/ML injection    Sig: Inject 1 mL (1,000 mcg total) into the muscle once for 1 dose.    Dispense:  1 mL    Refill:  0   cyanocobalamin (VITAMIN B12) injection 1,000 mcg    I, Nance Pear, NP, personally preformed the services described in this documentation.  All medical record entries made by the scribe were at my direction and in my presence.  I have reviewed the chart and discharge instructions (if applicable) and agree that the record reflects my personal performance and is accurate and complete. 04/21/2022   I,Amber Collins,acting as a Education administrator for Nance Pear, NP.,have documented all relevant  documentation on the behalf of Nance Pear, NP,as directed by  Nance Pear, NP while in the presence of Nance Pear, NP.    Nance Pear, NP

## 2022-04-23 ENCOUNTER — Ambulatory Visit: Payer: Medicare HMO

## 2022-04-27 ENCOUNTER — Ambulatory Visit (INDEPENDENT_AMBULATORY_CARE_PROVIDER_SITE_OTHER): Payer: Medicare HMO | Admitting: Family Medicine

## 2022-04-27 ENCOUNTER — Ambulatory Visit: Payer: Self-pay

## 2022-04-27 ENCOUNTER — Encounter: Payer: Self-pay | Admitting: Family Medicine

## 2022-04-27 VITALS — BP 114/82 | Ht 66.0 in | Wt 154.0 lb

## 2022-04-27 DIAGNOSIS — T148XXA Other injury of unspecified body region, initial encounter: Secondary | ICD-10-CM | POA: Diagnosis not present

## 2022-04-27 NOTE — Assessment & Plan Note (Signed)
Acutely occurring after an injury.  Based on the injury mechanism, it was likely the rhomboid major was torn.  No hematoma appreciated on exam today. -Counseled on home exercise therapy and supportive care. -Could consider shockwave therapy or physical therapy.

## 2022-04-27 NOTE — Patient Instructions (Signed)
Nice to meet you Please try heat. Please try massage on the area. Try the exercises Please send me a message in MyChart with any questions or updates.  Please see me back as needed or 4 weeks if ongoing.   --Dr. Raeford Razor

## 2022-04-27 NOTE — Progress Notes (Signed)
  Makoto Sellitto - 71 y.o. male MRN [id number]  Date of birth: Sep 23, 1950  SUBJECTIVE:  Including CC & ROS.  No chief complaint on file.   Jud Fanguy is a 71 y.o. male that is presenting with acute periscapular pain.  Initially felt the pain when he was lifting a gas tank out of a bed of the pickup truck.  Imaging a couple months ago showed a hematoma in the area of concern.  He has started noticing improvement recently.  Review of the office note from 9/12 shows he was counseled on supportive care   Review of Systems See HPI   HISTORY: Past Medical, Surgical, Social, and Family History Reviewed & Updated per EMR.   Pertinent Historical Findings include:  Past Medical History:  Diagnosis Date   Anemia 01/26/2018   Anxiety    Depression    Depression with anxiety 01/08/2016   H/O ischemic right MCA stroke    History of chicken pox 05/11/2017   Hyperlipidemia    Hypertension    Liver disease    hepatitis c   Right shoulder pain 05/11/2017   Stroke [facility])     Past Surgical History:  Procedure Laterality Date   CATARACT EXTRACTION, BILATERAL  2022   EYE SURGERY Bilateral 2021   cataracts     PHYSICAL EXAM:  VS: BP 114/82 (BP Location: Left Arm, Patient Position: Sitting)   Ht '5\' 6"'$  (1.676 m)   Wt 154 lb (69.9 kg)   BMI 24.86 kg/m  Physical Exam Gen: NAD, alert, cooperative with exam, well-appearing MSK:  Neurovascularly intact    Limited ultrasound: Right periscapular region:  No hematoma appreciated. Normal-appearing supraspinatus muscle belly. No overt muscle tear  Summary: No significant structural changes appreciated  Ultrasound and interpretation by Clearance Coots, MD    ASSESSMENT & PLAN:   Hematoma Acutely occurring after an injury.  Based on the injury mechanism, it was likely the rhomboid major was torn.  No hematoma appreciated on exam today. -Counseled on home exercise therapy and supportive care. -Could consider shockwave therapy or physical  therapy.

## 2022-05-02 ENCOUNTER — Emergency Department (HOSPITAL_COMMUNITY)
Admission: EM | Admit: 2022-05-02 | Discharge: 2022-05-03 | Disposition: A | Discharge status: Home / Self Care | Payer: Medicare HMO | Attending: Emergency Medicine | Admitting: Emergency Medicine

## 2022-05-02 ENCOUNTER — Encounter (HOSPITAL_COMMUNITY): Payer: Self-pay

## 2022-05-02 ENCOUNTER — Other Ambulatory Visit: Payer: Self-pay

## 2022-05-02 ENCOUNTER — Emergency Department (HOSPITAL_COMMUNITY): Payer: Medicare HMO

## 2022-05-02 DIAGNOSIS — R109 Unspecified abdominal pain: Secondary | ICD-10-CM | POA: Diagnosis not present

## 2022-05-02 DIAGNOSIS — I700 Atherosclerosis of aorta: Secondary | ICD-10-CM | POA: Insufficient documentation

## 2022-05-02 DIAGNOSIS — R0781 Pleurodynia: Secondary | ICD-10-CM | POA: Insufficient documentation

## 2022-05-02 DIAGNOSIS — R222 Localized swelling, mass and lump, trunk: Secondary | ICD-10-CM | POA: Insufficient documentation

## 2022-05-02 DIAGNOSIS — R0789 Other chest pain: Secondary | ICD-10-CM | POA: Insufficient documentation

## 2022-05-02 DIAGNOSIS — R58 Hemorrhage, not elsewhere classified: Secondary | ICD-10-CM

## 2022-05-02 DIAGNOSIS — M7981 Nontraumatic hematoma of soft tissue: Secondary | ICD-10-CM | POA: Insufficient documentation

## 2022-05-02 DIAGNOSIS — K5730 Diverticulosis of large intestine without perforation or abscess without bleeding: Secondary | ICD-10-CM | POA: Diagnosis not present

## 2022-05-02 DIAGNOSIS — I2510 Atherosclerotic heart disease of native coronary artery without angina pectoris: Secondary | ICD-10-CM | POA: Insufficient documentation

## 2022-05-02 DIAGNOSIS — M546 Pain in thoracic spine: Secondary | ICD-10-CM | POA: Diagnosis not present

## 2022-05-02 DIAGNOSIS — Z7902 Long term (current) use of antithrombotics/antiplatelets: Secondary | ICD-10-CM | POA: Diagnosis not present

## 2022-05-02 DIAGNOSIS — R911 Solitary pulmonary nodule: Secondary | ICD-10-CM | POA: Diagnosis not present

## 2022-05-02 DIAGNOSIS — T148XXA Other injury of unspecified body region, initial encounter: Secondary | ICD-10-CM

## 2022-05-02 LAB — CBC WITH DIFFERENTIAL/PLATELET
Abs Immature Granulocytes: 0.02 10*3/uL (ref 0.00–0.07)
Basophils Absolute: 0 10*3/uL (ref 0.0–0.1)
Basophils Relative: 1 %
Eosinophils Absolute: 0.4 10*3/uL (ref 0.0–0.5)
Eosinophils Relative: 4 %
HCT: 34.5 % — ABNORMAL LOW (ref 39.0–52.0)
Hemoglobin: 10.9 g/dL — ABNORMAL LOW (ref 13.0–17.0)
Immature Granulocytes: 0 %
Lymphocytes Relative: 16 %
Lymphs Abs: 1.3 10*3/uL (ref 0.7–4.0)
MCH: 30.3 pg (ref 26.0–34.0)
MCHC: 31.6 g/dL (ref 30.0–36.0)
MCV: 95.8 fL (ref 80.0–100.0)
Monocytes Absolute: 0.7 10*3/uL (ref 0.1–1.0)
Monocytes Relative: 9 %
Neutro Abs: 5.8 10*3/uL (ref 1.7–7.7)
Neutrophils Relative %: 70 %
Platelets: 273 10*3/uL (ref 150–400)
RBC: 3.6 MIL/uL — ABNORMAL LOW (ref 4.22–5.81)
RDW: 13.6 % (ref 11.5–15.5)
WBC: 8.2 10*3/uL (ref 4.0–10.5)
nRBC: 0 % (ref 0.0–0.2)

## 2022-05-02 LAB — COMPREHENSIVE METABOLIC PANEL
ALT: 16 U/L (ref 0–44)
AST: 9 U/L — ABNORMAL LOW (ref 15–41)
Albumin: 3.9 g/dL (ref 3.5–5.0)
Alkaline Phosphatase: 73 U/L (ref 38–126)
Anion gap: 9 (ref 5–15)
BUN: 32 mg/dL — ABNORMAL HIGH (ref 8–23)
CO2: 21 mmol/L — ABNORMAL LOW (ref 22–32)
Calcium: 9 mg/dL (ref 8.9–10.3)
Chloride: 105 mmol/L (ref 98–111)
Creatinine, Ser: 1.47 mg/dL — ABNORMAL HIGH (ref 0.61–1.24)
GFR, Estimated: 51 mL/min — ABNORMAL LOW (ref >60)
Glucose, Bld: 110 mg/dL — ABNORMAL HIGH (ref 70–99)
Potassium: 4.8 mmol/L (ref 3.5–5.1)
Sodium: 135 mmol/L (ref 135–145)
Total Bilirubin: 0.2 mg/dL — ABNORMAL LOW (ref 0.3–1.2)
Total Protein: 7 g/dL (ref 6.5–8.1)

## 2022-05-02 MED ORDER — OXYCODONE HCL 5 MG PO TABS
10.0000 mg | ORAL_TABLET | Freq: Once | ORAL | Status: AC
  Administered 2022-05-02: 10 mg via ORAL
  Filled 2022-05-02: qty 2

## 2022-05-02 MED ORDER — OXYCODONE HCL 5 MG PO TABS: 5.0000 mg | ORAL_TABLET | Freq: Four times a day (QID) | ORAL | 0 refills | Status: AC | PRN

## 2022-05-02 MED ORDER — IOHEXOL 350 MG/ML SOLN
80.0000 mL | Freq: Once | INTRAVENOUS | Status: AC | PRN
  Administered 2022-05-02: 80 mL via INTRAVENOUS

## 2022-05-02 NOTE — ED Provider Notes (Signed)
[facility] EMERGENCY DEPARTMENT Provider Note   CSN: [id number] Arrival date & time: 05/02/22  1518     History {Add pertinent medical, surgical, social history, OB history to HPI:1} Chief Complaint  Patient presents with   Mass   Back Pain    Ray Williams is a 71 y.o. male.  Pt I a 71 yo male presenting for mass on back. Pt states on July 23rd he was reaching into his truck to grab a five galloon gas can and had immediate pain right sided back pain between the shoulder blade and the spine. Pt saw his primary care office who ordered an US of the back that suggested a 10.6 x 2.2 x 7.1 cm mixed echogenic mass is noted the right upper back in the region of palpable abnormality. Given the patient's history this is most likely a hematoma. Primary care referred pt to sports medicine where he saw Dr. Tamala Julian 4-5 days who repeated the [place] and suggested the hematoma was improving. Pt was given stretching and light weight lifting exercises. Pt did not start these exercises. States today he developed bruising over the right ribs and severe pain. Patient also developed new mass on back.   The history is provided by the patient and the spouse. No language interpreter was used.  Back Pain      Home Medications Prior to Admission medications   Medication Sig Start Date End Date Taking? Authorizing Provider  atorvastatin (LIPITOR) 80 MG tablet Take 1 tablet (80 mg total) by mouth daily. 03/20/22   Debbrah Alar, NP  citalopram (CELEXA) 20 MG tablet Take 1 tablet (20 mg total) by mouth daily. 03/20/22   Debbrah Alar, NP  clopidogrel (PLAVIX) 75 MG tablet Take 1 tablet by mouth once daily 01/16/22   Mosie Lukes, MD  lisinopril-hydrochlorothiazide (ZESTORETIC) 20-25 MG tablet Take 1 tablet by mouth once daily 01/16/22   Mosie Lukes, MD      Allergies    Patient has no known allergies.    Review of Systems   Review of Systems  Musculoskeletal:  Positive for back  pain.    Physical Exam Updated Vital Signs BP (!) 123/59   Pulse 71   Temp 98.3 F (36.8 C) (Oral)   Resp 18   Ht '5\' 6"'$  (1.676 m)   Wt 68.9 kg   SpO2 96%   BMI 24.53 kg/m  Physical Exam  ED Results / Procedures / Treatments   Labs (all labs ordered are listed, but only abnormal results are displayed) Labs Reviewed  COMPREHENSIVE METABOLIC PANEL - Abnormal; Notable for the following components:      Result Value   CO2 21 (*)    Glucose, Bld 110 (*)    BUN 32 (*)    Creatinine, Ser 1.47 (*)    AST 9 (*)    Total Bilirubin 0.2 (*)    GFR, Estimated 51 (*)    All other components within normal limits  CBC WITH DIFFERENTIAL/PLATELET - Abnormal; Notable for the following components:   RBC 3.60 (*)    Hemoglobin 10.9 (*)    HCT 34.5 (*)    All other components within normal limits  PROTIME-INR    EKG None  Radiology DG Chest 2 View  Result Date: 05/02/2022 CLINICAL DATA:  Chest wall pain. EXAM: CHEST - 2 VIEW COMPARISON:  Chest x-ray 06/21/2015 FINDINGS: The heart size and mediastinal contours are within normal limits. Both lungs are clear. The visualized skeletal structures are  unremarkable. IMPRESSION: No active cardiopulmonary disease. Electronically Signed   By: Ronney Asters M.D.   On: 05/02/2022 17:24    Procedures Procedures  {Document cardiac monitor, telemetry assessment procedure when appropriate:1}  Medications Ordered in ED Medications - No data to display  ED Course/ Medical Decision Making/ A&P                           Medical Decision Making Amount and/or Complexity of Data Reviewed Radiology: ordered.   ***  {Document critical care time when appropriate:1} {Document review of labs and clinical decision tools ie heart score, Chads2Vasc2 etc:1}  {Document your independent review of radiology images, and any outside records:1} {Document your discussion with family members, caretakers, and with consultants:1} {Document social determinants of  health affecting pt's care:1} {Document your decision making why or why not admission, treatments were needed:1} Final Clinical Impression(s) / ED Diagnoses Final diagnoses:  None    Rx / DC Orders ED Discharge Orders     None

## 2022-05-02 NOTE — ED Notes (Signed)
Patient resting talking with wife at bedside, no s/s of any distress noted. 9/10 pain to right flank, pain medication given. Will continue to monitor

## 2022-05-02 NOTE — ED Provider Triage Note (Signed)
Emergency Medicine Provider Triage Evaluation Note  Ray Williams , a 71 y.o. male  was evaluated in triage.  Pt complains of chest wall pain.  Pain is located on the right lateral side of his chest wall.  He noticed some bruising and pain in that area this past Thursday.  He also noticed some bruising in his right abdomen.  Patient states that he had a stroke in 2016 and is on Plavix.  Review of Systems  Positive: See above Negative: See above  Physical Exam  BP 127/72 (BP Location: Right Arm)   Pulse 95   Temp 98.3 F (36.8 C) (Oral)   Resp 18   Ht '5\' 6"'$  (1.676 m)   Wt 68.9 kg   SpO2 99%   BMI 24.53 kg/m  Gen:   Awake, no distress   Resp:  Normal effort  MSK:   Moves extremities without difficulty  Other:  Right chest wall is swollen tender with area of ecchymosis about the lower aspect of the rib cage.  Also area of ecchymosis in the right lower quadrant of the abdomen.  Medical Decision Making  Medically screening exam initiated at 4:41 PM.  Appropriate orders placed.  Ray Williams was informed that the remainder of the evaluation will be completed by another provider, this initial triage assessment does not replace that evaluation, and the importance of remaining in the ED until their evaluation is complete.  Plan: Chest x-ray, CBC, CMP, pro time INR   Harriet Pho, PA-C 05/02/22 1644

## 2022-05-02 NOTE — ED Triage Notes (Signed)
Pt presents to the ED with a large area on the his mid right side of his back that is swelling and also has some bruising to right side of his ribs. Pt denies injury, he is on plavix. Pt had a similar episode back in July and was told it was a hematoma. States this new swelling started on Thursday and is more severe than the previous episode.

## 2022-05-05 ENCOUNTER — Encounter: Payer: Self-pay | Admitting: Family

## 2022-05-05 ENCOUNTER — Ambulatory Visit (INDEPENDENT_AMBULATORY_CARE_PROVIDER_SITE_OTHER): Payer: Medicare HMO | Admitting: Family

## 2022-05-05 VITALS — BP 138/100 | HR 60 | Temp 98.0°F | Ht 66.0 in | Wt 158.6 lb

## 2022-05-05 DIAGNOSIS — T148XXA Other injury of unspecified body region, initial encounter: Secondary | ICD-10-CM

## 2022-05-05 NOTE — Patient Instructions (Signed)
Dr. Raeford Razor is asking for you to schedule a follow up there- he has some non-surgical treatment options that he feels will be beneficial.

## 2022-05-05 NOTE — Progress Notes (Signed)
Ray Williams is a 71 y.o. male with the following history as recorded in Epic[employer]:  Patient Active Problem List   Diagnosis Date Noted   Hematoma 03/20/2022   B12 deficiency 03/20/2022   Anemia 01/26/2018   Left shoulder pain 06/06/2017   Hyperglycemia 05/16/2017   Left elbow pain 05/11/2017   Right shoulder pain 05/11/2017   History of chicken pox 05/11/2017   Preventative health care 08/31/2016   History of hepatitis C 01/08/2016   Depression with anxiety 01/08/2016   Loss of weight 06/21/2015   Cerebral infarction ([place])    Tobacco abuse    HLD (hyperlipidemia)    ICAO (internal carotid artery occlusion) 01/26/2015   Essential hypertension 01/26/2015    Current Outpatient Medications  Medication Sig Dispense Refill   atorvastatin (LIPITOR) 80 MG tablet Take 1 tablet (80 mg total) by mouth daily. 90 tablet 1   citalopram (CELEXA) 20 MG tablet Take 1 tablet (20 mg total) by mouth daily. 90 tablet 1   clopidogrel (PLAVIX) 75 MG tablet Take 1 tablet by mouth once daily 90 tablet 1   lisinopril-hydrochlorothiazide (ZESTORETIC) 20-25 MG tablet Take 1 tablet by mouth once daily 90 tablet 1   oxyCODONE (ROXICODONE) 5 MG immediate release tablet Take 1 tablet (5 mg total) by mouth every 6 (six) hours as needed for up to 3 days for severe pain. 12 tablet 0   Current Facility-Administered Medications  Medication Dose Route Frequency Provider Last Rate Last Admin   cyanocobalamin (VITAMIN B12) injection 1,000 mcg  1,000 mcg Intramuscular Q30 days Mosie Lukes, MD   1,000 mcg at 04/15/22 5027    Allergies: Patient has no known allergies.  Past Medical History:  Diagnosis Date   Anemia 01/26/2018   Anxiety    Depression    Depression with anxiety 01/08/2016   H/O ischemic right MCA stroke    History of chicken pox 05/11/2017   Hyperlipidemia    Hypertension    Liver disease    hepatitis c   Right shoulder pain 05/11/2017   Stroke [facility])     Past Surgical History:  Procedure  Laterality Date   CATARACT EXTRACTION, BILATERAL  2022   EYE SURGERY Bilateral 2021   cataracts    Family History  Problem Relation Age of Onset   Heart disease Mother        MI   Heart attack Mother    Asthma Mother    Hyperlipidemia Mother    Hypertension Mother    Diabetes Father    Healthy Maternal Grandmother    Healthy Maternal Grandfather    Healthy Paternal Grandmother    Healthy Paternal Grandfather    Hyperlipidemia Sister     Social History   Tobacco Use   Smoking status: Every Day    Packs/day: 0.50    Types: Cigarettes   Smokeless tobacco: Never  Substance Use Topics   Alcohol use: No    Alcohol/week: 0.0 standard drinks of alcohol    Subjective:  Follow up right sided hematoma- has been evaluated by sports medicine and went back to ER this past weekend with pain; No known injury or trauma- patient notes he stretched on Saturday morning/ felt something pop and pain developed; Had chest CT at ER which did show hematoma;       Objective:  Vitals:   05/05/22 1338 05/05/22 1436  BP: (!) 140/100 (!) 138/100  Pulse: 60   Temp: 98 F (36.7 C)   TempSrc: Oral   SpO2:  96%   Weight: 158 lb 9.6 oz (71.9 kg)   Height: '5\' 6"'$  (1.676 m)     General: Well developed, well nourished, in no acute distress  Skin : Warm and dry. Large hematoma noted over right shoulder blade with bruising noted along right flank Head: Normocephalic and atraumatic  Eyes: Sclera and conjunctiva clear; pupils round and reactive to light; extraocular movements intact  Ears: External normal; canals clear; tympanic membranes normal  Oropharynx: Pink, supple. No suspicious lesions  Neck: Supple without thyromegaly, adenopathy  Lungs: Respirations unlabored;  Neurologic: Alert and oriented; speech intact; face symmetrical; moves all extremities well; CNII-XII intact without focal deficit   Assessment:  1. Hematoma     Plan:  Discussed case with patient's sports medicine provider  while patient was in office; sports medicine provider felt that patient's symptoms could be managed by their office and surgical referral was not needed at this time; patient is sent back to his sports medicine office to schedule a follow up.   No follow-ups on file.  No orders of the defined types were placed in this encounter.   Requested Prescriptions    No prescriptions requested or ordered in this encounter

## 2022-05-12 ENCOUNTER — Encounter: Payer: Self-pay | Admitting: Family Medicine

## 2022-05-12 ENCOUNTER — Ambulatory Visit (INDEPENDENT_AMBULATORY_CARE_PROVIDER_SITE_OTHER): Payer: Medicare HMO | Admitting: Family Medicine

## 2022-05-12 ENCOUNTER — Ambulatory Visit: Payer: Self-pay

## 2022-05-12 VITALS — BP 145/76 | Ht 66.0 in | Wt 158.0 lb

## 2022-05-12 DIAGNOSIS — T148XXA Other injury of unspecified body region, initial encounter: Secondary | ICD-10-CM

## 2022-05-12 NOTE — Progress Notes (Signed)
  Ray Williams - 71 y.o. male MRN [id number]  Date of birth: 1951/02/09  SUBJECTIVE:  Including CC & ROS.  No chief complaint on file.   Ray Williams is a 71 y.o. male that is  presenting with acute right sided flank pain and bruising. Exacerbated with his normal activities. Having ecchymosis down his right flank. Hematoma was appreciated again on CT scan.  Review of the emergency department note from 9/23 shows he was provided Roxicodone. Independent review of the CT chest abdomen and pelvis from 9/23 shows a 2.7 centimeter thickness consistent with a hematoma  Review of Systems See HPI   HISTORY: Past Medical, Surgical, Social, and Family History Reviewed & Updated per EMR.   Pertinent Historical Findings include:  Past Medical History:  Diagnosis Date   Anemia 01/26/2018   Anxiety    Depression    Depression with anxiety 01/08/2016   H/O ischemic right MCA stroke    History of chicken pox 05/11/2017   Hyperlipidemia    Hypertension    Liver disease    hepatitis c   Right shoulder pain 05/11/2017   Stroke [facility])     Past Surgical History:  Procedure Laterality Date   CATARACT EXTRACTION, BILATERAL  2022   EYE SURGERY Bilateral 2021   cataracts     PHYSICAL EXAM:  VS: BP (!) 145/76 (BP Location: Left Arm, Patient Position: Sitting)   Ht '5\' 6"'$  (1.676 m)   Wt 158 lb (71.7 kg)   BMI 25.50 kg/m  Physical Exam Gen: NAD, alert, cooperative with exam, well-appearing MSK:  Neurovascularly intact    Limited ultrasound: Right flank:  An intramuscular hematoma was appreciated.  This was anechoic change observed in a panoramic view.  Summary: Hematoma in the right flank  Ultrasound and interpretation by Clearance Coots, MD   Aspiration/Injection Procedure Note Ray Williams 10-Jun-1951  Procedure: Aspiration Indications: Right flank pain  Procedure Details Consent: Risks of procedure as well as the alternatives and risks of each were explained to the  (patient/caregiver).  Consent for procedure obtained. Time Out: Verified patient identification, verified procedure, site/side was marked, verified correct patient position, special equipment/implants available, medications/allergies/relevent history reviewed, required imaging and test results available.  Performed.  The area was cleaned with iodine and alcohol swabs.    The right flank was injected using5 cc's of 1% lidocaine with a 25 1 1/2" needle.  An 18-gauge 1-1/2 inch needle was used to achieve aspiration.  Ultrasound was used. Images were obtained in long views showing the injection.    Amount of Fluid Aspirated: minimal amount Character of Fluid: bloody Fluid was sent for: n/a  A sterile dressing was applied.  Patient did tolerate procedure well.   ASSESSMENT & PLAN:   Hematoma Acute exacerbation of his muscle tear with subsequent developing hematoma and ecchymosis. -Counseled on home exercise therapy and supportive care. -Aspiration today. -Counseled on compression. -Consider shockwave therapy or physical therapy

## 2022-05-12 NOTE — Assessment & Plan Note (Signed)
Acute exacerbation of his muscle tear with subsequent developing hematoma and ecchymosis. -Counseled on home exercise therapy and supportive care. -Aspiration today. -Counseled on compression. -Consider shockwave therapy or physical therapy

## 2022-05-12 NOTE — Patient Instructions (Signed)
Good to see you Please try heat  Please continue compression   Please send me a message in MyChart with any questions or updates.  Please see me back in 1 week.   --Dr. Raeford Razor

## 2022-05-15 ENCOUNTER — Ambulatory Visit: Payer: Medicare HMO

## 2022-05-19 ENCOUNTER — Ambulatory Visit (INDEPENDENT_AMBULATORY_CARE_PROVIDER_SITE_OTHER): Payer: Medicare HMO | Admitting: *Deleted

## 2022-05-19 ENCOUNTER — Other Ambulatory Visit (INDEPENDENT_AMBULATORY_CARE_PROVIDER_SITE_OTHER): Payer: Medicare HMO

## 2022-05-19 ENCOUNTER — Ambulatory Visit (INDEPENDENT_AMBULATORY_CARE_PROVIDER_SITE_OTHER): Payer: Medicare HMO | Admitting: Family Medicine

## 2022-05-19 ENCOUNTER — Encounter: Payer: Self-pay | Admitting: Family Medicine

## 2022-05-19 DIAGNOSIS — D649 Anemia, unspecified: Secondary | ICD-10-CM

## 2022-05-19 DIAGNOSIS — T148XXA Other injury of unspecified body region, initial encounter: Secondary | ICD-10-CM | POA: Diagnosis not present

## 2022-05-19 DIAGNOSIS — E538 Deficiency of other specified B group vitamins: Secondary | ICD-10-CM | POA: Diagnosis not present

## 2022-05-19 MED ORDER — CYANOCOBALAMIN 1000 MCG/ML IJ SOLN
1000.0000 ug | Freq: Once | INTRAMUSCULAR | Status: AC
  Administered 2022-05-19: 1000 ug via INTRAMUSCULAR

## 2022-05-19 NOTE — Assessment & Plan Note (Signed)
Doing well since the aspiration. Minimal pain today.  - counseled on home exercise and supportive care - could consider PT.

## 2022-05-19 NOTE — Progress Notes (Addendum)
Pt here for monthly B12 injection per Melissa.  Pt stated that b12 was sent to pharmacy and they brought it in.    We will give what pt brought in.   B12 1084mg given IM, and pt tolerated injection well.   Next B12 injection scheduled for 06/19/22

## 2022-05-19 NOTE — Progress Notes (Signed)
  Alfie Rideaux - 71 y.o. male MRN [id number]  Date of birth: 1950/10/08  SUBJECTIVE:  Including CC & ROS.  No chief complaint on file.   Eshawn Coor is a 71 y.o. male that is  following up for his right middle back/flankl hematoma. Has done well since the aspiration. His bruising has improved.    Review of Systems See HPI   HISTORY: Past Medical, Surgical, Social, and Family History Reviewed & Updated per EMR.   Pertinent Historical Findings include:  Past Medical History:  Diagnosis Date   Anemia 01/26/2018   Anxiety    Depression    Depression with anxiety 01/08/2016   H/O ischemic right MCA stroke    History of chicken pox 05/11/2017   Hyperlipidemia    Hypertension    Liver disease    hepatitis c   Right shoulder pain 05/11/2017   Stroke [facility])     Past Surgical History:  Procedure Laterality Date   CATARACT EXTRACTION, BILATERAL  2022   EYE SURGERY Bilateral 2021   cataracts     PHYSICAL EXAM:  VS: BP (!) 100/54 (BP Location: Left Arm, Patient Position: Sitting)   Ht '5\' 6"'$  (1.676 m)   Wt 158 lb (71.7 kg)   BMI 25.50 kg/m  Physical Exam Gen: NAD, alert, cooperative with exam, well-appearing MSK:  Neurovascularly intact       ASSESSMENT & PLAN:   Hematoma Doing well since the aspiration. Minimal pain today.  - counseled on home exercise and supportive care - could consider PT.

## 2022-05-19 NOTE — Addendum Note (Signed)
Addended by: Kelle Darting A on: 05/19/2022 10:59 AM   Modules accepted: Orders

## 2022-05-20 ENCOUNTER — Telehealth: Payer: Self-pay

## 2022-05-20 ENCOUNTER — Telehealth: Payer: Self-pay | Admitting: Family

## 2022-05-20 DIAGNOSIS — R195 Other fecal abnormalities: Secondary | ICD-10-CM

## 2022-05-20 LAB — FECAL OCCULT BLOOD, IMMUNOCHEMICAL: Fecal Occult Bld: POSITIVE — AB

## 2022-05-20 NOTE — Telephone Encounter (Signed)
Positive Ifob   Caller: Marty Heck  Receiver: Manuela Schwartz, RMA  Date and time:  05/20/2022 @ 12:17pm

## 2022-05-20 NOTE — Telephone Encounter (Signed)
Please advise pt that his stool is showing hidden blood. I would like for him to meet with GI for further evaluation. Please give him their number in case he doesn't hear from them.

## 2022-05-21 NOTE — Telephone Encounter (Signed)
Patient has been referred to GI

## 2022-05-21 NOTE — Telephone Encounter (Signed)
Telephone note

## 2022-05-26 NOTE — Telephone Encounter (Signed)
GI appointment is scheduled for 11/14.

## 2022-06-19 ENCOUNTER — Ambulatory Visit (INDEPENDENT_AMBULATORY_CARE_PROVIDER_SITE_OTHER): Payer: Medicare HMO

## 2022-06-19 DIAGNOSIS — E538 Deficiency of other specified B group vitamins: Secondary | ICD-10-CM | POA: Diagnosis not present

## 2022-06-19 MED ORDER — CYANOCOBALAMIN 1000 MCG/ML IJ SOLN
1000.0000 ug | Freq: Once | INTRAMUSCULAR | Status: AC
  Administered 2022-06-19: 1000 ug via INTRAMUSCULAR

## 2022-06-19 NOTE — Progress Notes (Signed)
[name] is a 71 y.o. male presents to the office today for 2/6: Monthly B12 injections, per physician's orders. Original order: 03/20/22: " Begin b12 injections 1064mg IM weekly for 4 weeks then monthly. Repeat cbc and b12 in 6 weeks. Dx anemia."  Cyanocobalamin 1000 mg  was administered L Deltoid today.Patient tolerated injection. Patient due for follow up labs/provider appt: No.  Patient next injection due: 1 month , appt made Yes- already pending   Creft, TKristine GarbeL

## 2022-06-23 ENCOUNTER — Other Ambulatory Visit (INDEPENDENT_AMBULATORY_CARE_PROVIDER_SITE_OTHER): Payer: Medicare HMO

## 2022-06-23 ENCOUNTER — Telehealth: Payer: Self-pay

## 2022-06-23 ENCOUNTER — Encounter: Payer: Self-pay | Admitting: Physician Assistant

## 2022-06-23 ENCOUNTER — Ambulatory Visit (INDEPENDENT_AMBULATORY_CARE_PROVIDER_SITE_OTHER): Payer: Medicare HMO | Admitting: Physician Assistant

## 2022-06-23 VITALS — BP 132/60 | HR 69 | Ht 67.0 in | Wt 155.0 lb

## 2022-06-23 DIAGNOSIS — Z7901 Long term (current) use of anticoagulants: Secondary | ICD-10-CM | POA: Diagnosis not present

## 2022-06-23 DIAGNOSIS — Z8673 Personal history of transient ischemic attack (TIA), and cerebral infarction without residual deficits: Secondary | ICD-10-CM

## 2022-06-23 DIAGNOSIS — R195 Other fecal abnormalities: Secondary | ICD-10-CM

## 2022-06-23 DIAGNOSIS — D649 Anemia, unspecified: Secondary | ICD-10-CM

## 2022-06-23 DIAGNOSIS — E538 Deficiency of other specified B group vitamins: Secondary | ICD-10-CM

## 2022-06-23 LAB — CBC WITH DIFFERENTIAL/PLATELET
Basophils Absolute: 0 10*3/uL (ref 0.0–0.1)
Basophils Relative: 0.5 % (ref 0.0–3.0)
Eosinophils Absolute: 0.3 10*3/uL (ref 0.0–0.7)
Eosinophils Relative: 5 % (ref 0.0–5.0)
HCT: 36.1 % — ABNORMAL LOW (ref 39.0–52.0)
Hemoglobin: 12 g/dL — ABNORMAL LOW (ref 13.0–17.0)
Lymphocytes Relative: 21 % (ref 12.0–46.0)
Lymphs Abs: 1.3 10*3/uL (ref 0.7–4.0)
MCHC: 33.2 g/dL (ref 30.0–36.0)
MCV: 90.5 fl (ref 78.0–100.0)
Monocytes Absolute: 0.5 10*3/uL (ref 0.1–1.0)
Monocytes Relative: 7.4 % (ref 3.0–12.0)
Neutro Abs: 4.2 10*3/uL (ref 1.4–7.7)
Neutrophils Relative %: 66.1 % (ref 43.0–77.0)
Platelets: 272 10*3/uL (ref 150.0–400.0)
RBC: 3.99 Mil/uL — ABNORMAL LOW (ref 4.22–5.81)
RDW: 14 % (ref 11.5–15.5)
WBC: 6.4 10*3/uL (ref 4.0–10.5)

## 2022-06-23 LAB — COMPREHENSIVE METABOLIC PANEL
ALT: 14 U/L (ref 0–53)
AST: 7 U/L (ref 0–37)
Albumin: 4.3 g/dL (ref 3.5–5.2)
Alkaline Phosphatase: 78 U/L (ref 39–117)
BUN: 23 mg/dL (ref 6–23)
CO2: 25 mEq/L (ref 19–32)
Calcium: 8.8 mg/dL (ref 8.4–10.5)
Chloride: 104 mEq/L (ref 96–112)
Creatinine, Ser: 1.29 mg/dL (ref 0.40–1.50)
GFR: 56.28 mL/min — ABNORMAL LOW (ref >60.00)
Glucose, Bld: 83 mg/dL (ref 70–99)
Potassium: 4.3 mEq/L (ref 3.5–5.1)
Sodium: 135 mEq/L (ref 135–145)
Total Bilirubin: 0.3 mg/dL (ref 0.2–1.2)
Total Protein: 7.1 g/dL (ref 6.0–8.3)

## 2022-06-23 LAB — IBC + FERRITIN
Ferritin: 82.4 ng/mL (ref 22.0–322.0)
Iron: 87 ug/dL (ref 42–165)
Saturation Ratios: 30.2 % (ref 20.0–50.0)
TIBC: 288.4 ug/dL (ref 250.0–450.0)
Transferrin: 206 mg/dL — ABNORMAL LOW (ref 212.0–360.0)

## 2022-06-23 LAB — VITAMIN B12: Vitamin B-12: 969 pg/mL — ABNORMAL HIGH (ref 211–911)

## 2022-06-23 MED ORDER — PLENVU 140 G PO SOLR: 1.0000 | Freq: Once | ORAL | 0 refills | Status: AC

## 2022-06-23 NOTE — Addendum Note (Signed)
Addended by: Julieanne Cotton K on: 06/23/2022 10:50 AM   Modules accepted: Orders

## 2022-06-23 NOTE — Progress Notes (Signed)
Chief Complaint: Hemoccult positive stool  HPI:    [name] is a 71 year old African-American male, previously assigned to Dr. Ardis Hughs, with a past medical history as listed below including depression and anxiety as well as history of stroke on Plavix (01/28/2015 echo with an LVEF 60-65%), who was referred to me by Debbrah Alar, NP for a complaint of Hemoccult positive stool.      05/02/2022 hemoglobin 10.9 (03/20/2022 10.9, 12/18/2021 12.6), CMP with a glucose of 110, BUN 32, creatinine 1.47.  CT chest abdomen and pelvis with contrast with bruising over the lower ribs, swelling and pain in the right flank showing hematoma over the lips and a 5 mm left lower lobe subpleural nodule.    05/19/2022 fecal occult blood positive.    Today, the patient presents clinic accompanied by his wife.  He tells me he has never had an endoscopy or colonoscopy but now they tell him that he needs to.  He denies any GI complaints or concerns.  Aware of his anemia and Hemoccult positive stool.    Denies fever, chills, change in bowel habits, abdominal pain, blood in his stool, heartburn, reflux, nausea or vomiting.  Past Medical History:  Diagnosis Date   Anemia 01/26/2018   Anxiety    Depression    Depression with anxiety 01/08/2016   H/O ischemic right MCA stroke    History of chicken pox 05/11/2017   Hyperlipidemia    Hypertension    Liver disease    hepatitis c   Right shoulder pain 05/11/2017   Stroke [facility])     Past Surgical History:  Procedure Laterality Date   CATARACT EXTRACTION, BILATERAL  2022   EYE SURGERY Bilateral 2021   cataracts    Current Outpatient Medications  Medication Sig Dispense Refill   atorvastatin (LIPITOR) 80 MG tablet Take 1 tablet (80 mg total) by mouth daily. 90 tablet 1   clopidogrel (PLAVIX) 75 MG tablet Take 1 tablet by mouth once daily 90 tablet 1   lisinopril-hydrochlorothiazide (ZESTORETIC) 20-25 MG tablet Take 1 tablet by mouth once daily 90 tablet 1    citalopram (CELEXA) 20 MG tablet Take 1 tablet (20 mg total) by mouth daily. 90 tablet 1   No current facility-administered medications for this visit.    Allergies as of 06/23/2022   (No Known Allergies)    Family History  Problem Relation Age of Onset   Heart disease Mother        MI   Heart attack Mother    Asthma Mother    Hyperlipidemia Mother    Hypertension Mother    Diabetes Father    Healthy Maternal Grandmother    Healthy Maternal Grandfather    Healthy Paternal Grandmother    Healthy Paternal Grandfather    Hyperlipidemia Sister     Social History   Socioeconomic History   Marital status: Married    Spouse name: Not on file   Number of children: 2   Years of education: 12   Highest education level: Not on file  Occupational History   Occupation: Teaching laboratory technician  Tobacco Use   Smoking status: Every Day    Packs/day: 0.50    Types: Cigarettes   Smokeless tobacco: Never  Substance and Sexual Activity   Alcohol use: No    Alcohol/week: 0.0 standard drinks of alcohol   Drug use: No   Sexual activity: Not Currently  Other Topics Concern   Not on file  Social History Narrative   Fun: Fish,  collectable cars/classic cars   Denies religious beliefs effecting health care   Retired from Therapist, occupational   Lives with wife   No dietary restrictions, wears seat belt.    Social Determinants of Health   Financial Resource Strain: Low Risk  (10/27/2021)   Overall Financial Resource Strain (CARDIA)    Difficulty of Paying Living Expenses: Not hard at all  Food Insecurity: No Food Insecurity (10/27/2021)   Hunger Vital Sign    Worried About Running Out of Food in the Last Year: Never true    Ran Out of Food in the Last Year: Never true  Transportation Needs: No Transportation Needs (10/27/2021)   PRAPARE - Hydrologist (Medical): No    Lack of Transportation (Non-Medical): No  Physical Activity: Inactive (10/27/2021)    Exercise Vital Sign    Days of Exercise per Week: 0 days    Minutes of Exercise per Session: 0 min  Stress: No Stress Concern Present (10/27/2021)   [place]    Feeling of Stress : Not at all  Social Connections: Moderately Isolated (10/27/2021)   Social Connection and Isolation Panel [NHANES]    Frequency of Communication with Friends and Family: More than three times a week    Frequency of Social Gatherings with Friends and Family: More than three times a week    Attends Religious Services: Never    Marine scientist or Organizations: No    Attends Archivist Meetings: Never    Marital Status: Married  Human resources officer Violence: Not At Risk (10/27/2021)   Humiliation, Afraid, Rape, and Kick questionnaire    Fear of Current or Ex-Partner: No    Emotionally Abused: No    Physically Abused: No    Sexually Abused: No    Review of Systems:    Constitutional: No weight loss, fever or chills Skin: No rash  Cardiovascular: No chest pain  Respiratory: No SOB  Gastrointestinal: See HPI and otherwise negative Genitourinary: No dysuria  Neurological: No headache, dizziness or syncope Musculoskeletal: No new muscle or joint pain Hematologic: No bleeding  Psychiatric: No history of depression or anxiety   Physical Exam:  Vital signs: BP 132/60   Pulse 69   Ht '5\' 7"'$  (1.702 m)   Wt 155 lb (70.3 kg)   BMI 24.28 kg/m    Constitutional:   Pleasant elderly Caucasian male appears to be in NAD, Well developed, Well nourished, alert and cooperative Head:  Normocephalic and atraumatic. Eyes:   PEERL, EOMI. No icterus. Conjunctiva pink. Ears:  Normal auditory acuity. Neck:  Supple Throat: Oral cavity and pharynx without inflammation, swelling or lesion.  Respiratory: Respirations even and unlabored. Lungs clear to auscultation bilaterally.   No wheezes, crackles, or rhonchi.  Cardiovascular: Normal S1, S2. No  MRG. Regular rate and rhythm. No peripheral edema, cyanosis or pallor.  Gastrointestinal:  Soft, nondistended, nontender. No rebound or guarding. Normal bowel sounds. No appreciable masses or hepatomegaly. Rectal:  Not performed.  Msk:  Symmetrical without gross deformities. Without edema, no deformity or joint abnormality.  Neurologic:  Alert and  oriented x4;  grossly normal neurologically.  Skin:   Dry and intact without significant lesions or rashes. Psychiatric:  Demonstrates good judgement and reason without abnormal affect or behaviors.  RELEVANT LABS AND IMAGING: CBC    Component Value Date/Time   WBC 8.2 05/02/2022 1620   RBC 3.60 (L) 05/02/2022 1620   HGB  10.9 (L) 05/02/2022 1620   HCT 34.5 (L) 05/02/2022 1620   PLT 273 05/02/2022 1620   MCV 95.8 05/02/2022 1620   MCH 30.3 05/02/2022 1620   MCHC 31.6 05/02/2022 1620   RDW 13.6 05/02/2022 1620   LYMPHSABS 1.3 05/02/2022 1620   MONOABS 0.7 05/02/2022 1620   EOSABS 0.4 05/02/2022 1620   BASOSABS 0.0 05/02/2022 1620    CMP     Component Value Date/Time   NA 135 05/02/2022 1620   K 4.8 05/02/2022 1620   CL 105 05/02/2022 1620   CO2 21 (L) 05/02/2022 1620   GLUCOSE 110 (H) 05/02/2022 1620   BUN 32 (H) 05/02/2022 1620   CREATININE 1.47 (H) 05/02/2022 1620   CREATININE 1.21 06/23/2016 1136   CALCIUM 9.0 05/02/2022 1620   PROT 7.0 05/02/2022 1620   ALBUMIN 3.9 05/02/2022 1620   AST 9 (L) 05/02/2022 1620   ALT 16 05/02/2022 1620   ALT 58 (H) 01/08/2016 1055   ALKPHOS 73 05/02/2022 1620   BILITOT 0.2 (L) 05/02/2022 1620   GFRNONAA 51 (L) 05/02/2022 1620   GFRNONAA 63 06/23/2016 1136   GFRAA 73 06/23/2016 1136    Assessment: 1.  Anemia: Hemoglobin is dropped over the past year, remaining steady around 10, being treated for B12 deficiency, I cannot see any iron studies; consider iron deficiency anemia versus other 2.  Hemoccult positive: Consider GI source i.e. hemorrhoids versus polyps versus other 3.  History of  stroke on Plavix  Plan: 1.  Scheduled patient for diagnostic EGD and colonoscopy in the [facility] due to anemia and Hemoccult positive stools.  This was scheduled Dr. Silverio Decamp.  Did provide the patient a detailed list of risks for the procedures and he agrees to proceed. Patient is appropriate for endoscopic procedure(s) in the ambulatory ([facility]) setting.  2.  Patient was advised to hold his Plavix for 5 days prior to time of procedures.  We will communicate this with the prescribing physician to ensure this is acceptable for him. 3.  Patient to follow in clinic per recommendations after time of procedures. 4.  Patient scheduled with Dr. Silverio Decamp as she is supervising today in lieu of Dr. Ardis Hughs absence.  Ellouise Newer, PA-C [facility] 06/23/2022, 10:12 AM  Cc: Debbrah Alar, NP

## 2022-06-23 NOTE — Telephone Encounter (Signed)
   Ray Williams 1951-01-30 [id number]  Dear Dr. Charlett Blake:  We have scheduled the above named patient for a(n) endoscopic procedure. Our records show that (s)he is on anticoagulation therapy.  Please advise as to whether the patient may come off their therapy of Plavix 5 days prior to their procedure which is scheduled for 07/11/2022.  Please route your response to [facility] or fax response to [phone].  Sincerely,    [facility]

## 2022-06-23 NOTE — Patient Instructions (Signed)
_______________________________________________________  If you are age 70 or older, your body mass index should be between 23-30. Your Body mass index is 24.28 kg/m. If this is out of the aforementioned range listed, please consider follow up with your Primary Care Provider.  If you are age 70 or younger, your body mass index should be between 19-25. Your Body mass index is 24.28 kg/m. If this is out of the aformentioned range listed, please consider follow up with your Primary Care Provider.   ________________________________________________________  The [facility] providers would like to encourage you to use [facility] to communicate with providers for non-urgent requests or questions.  Due to long hold times on the telephone, sending your provider a message by [facility] may be a faster and more efficient way to get a response.  Please allow 48 business hours for a response.  Please remember that this is for non-urgent requests.  _______________________________________________________  Ray Williams have been scheduled for an endoscopy and colonoscopy. Please follow the written instructions given to you at your visit today. Please pick up your prep supplies at the pharmacy within the next 1-3 days. If you use inhalers (even only as needed), please bring them with you on the day of your procedure.

## 2022-06-24 NOTE — Telephone Encounter (Signed)
Home phone vm is full; lm on vm on cell phone.

## 2022-06-26 NOTE — Telephone Encounter (Signed)
Told patient that per Ray Williams he could hold his Plavix for five days prior to his procedure.  Patient agreed.

## 2022-07-11 ENCOUNTER — Ambulatory Visit (AMBULATORY_SURGERY_CENTER): Payer: Medicare HMO | Admitting: Gastroenterology

## 2022-07-11 ENCOUNTER — Encounter: Payer: Self-pay | Admitting: Gastroenterology

## 2022-07-11 VITALS — BP 116/46 | HR 53 | Temp 97.6°F | Resp 18 | Ht 67.0 in | Wt 155.0 lb

## 2022-07-11 DIAGNOSIS — K2970 Gastritis, unspecified, without bleeding: Secondary | ICD-10-CM | POA: Diagnosis not present

## 2022-07-11 DIAGNOSIS — D649 Anemia, unspecified: Secondary | ICD-10-CM

## 2022-07-11 DIAGNOSIS — D123 Benign neoplasm of transverse colon: Secondary | ICD-10-CM | POA: Diagnosis not present

## 2022-07-11 DIAGNOSIS — R195 Other fecal abnormalities: Secondary | ICD-10-CM | POA: Diagnosis not present

## 2022-07-11 DIAGNOSIS — K2100 Gastro-esophageal reflux disease with esophagitis, without bleeding: Secondary | ICD-10-CM | POA: Diagnosis not present

## 2022-07-11 DIAGNOSIS — D509 Iron deficiency anemia, unspecified: Secondary | ICD-10-CM | POA: Diagnosis not present

## 2022-07-11 DIAGNOSIS — D128 Benign neoplasm of rectum: Secondary | ICD-10-CM

## 2022-07-11 MED ORDER — PANTOPRAZOLE SODIUM 40 MG PO TBEC: 40.0000 mg | DELAYED_RELEASE_TABLET | Freq: Every day | ORAL | 3 refills | Status: DC

## 2022-07-11 NOTE — Progress Notes (Signed)
Please refer to office visit note 06/23/22. No additional changes in H&P Patient is appropriate for planned procedure(s) and anesthesia in an ambulatory setting  K. Veena Kam Rahimi , MD [phone]   

## 2022-07-11 NOTE — Patient Instructions (Addendum)
- Patient has a contact number available for emergencies. The signs and symptoms of potential delayed complications were discussed with the patient. Return to normal activities tomorrow. Written discharge instructions were provided to the patient. - Resume previous diet. - Continue present medications. - Follow an antireflux regimen. - Await pathology results. - Use Protonix (pantoprazole) 40 mg PO daily. Rx for 90 days with 3 refills Repeat colonoscopy in 5-10 years for surveillance based on pathology results. - Resume Plavix (clopidogrel) at prior dose tomorrow. Refer to managing physician for further adjustment of therapy. - Follow up in GI office in 3 months- 10-09-22 at 10:40 am!! - See the other procedure note for documentation of additional recommendations.   YOU HAD AN ENDOSCOPIC PROCEDURE TODAY AT [facility]:   Refer to the procedure report that was given to you for any specific questions about what was found during the examination.  If the procedure report does not answer your questions, please call your gastroenterologist to clarify.  If you requested that your care partner not be given the details of your procedure findings, then the procedure report has been included in a sealed envelope for you to review at your convenience later.  YOU SHOULD EXPECT: Some feelings of bloating in the abdomen. Passage of more gas than usual.  Walking can help get rid of the air that was put into your GI tract during the procedure and reduce the bloating. If you had a lower endoscopy (such as a colonoscopy or flexible sigmoidoscopy) you may notice spotting of blood in your stool or on the toilet paper. If you underwent a bowel prep for your procedure, you may not have a normal bowel movement for a few days.  Please Note:  You might notice some irritation and congestion in your nose or some drainage.  This is from the oxygen used during your procedure.  There is no need for concern and it  should clear up in a day or so.  SYMPTOMS TO REPORT IMMEDIATELY:  Following lower endoscopy (colonoscopy or flexible sigmoidoscopy):  Excessive amounts of blood in the stool  Significant tenderness or worsening of abdominal pains  Swelling of the abdomen that is new, acute  Fever of 100F or higher  Following upper endoscopy (EGD)  Vomiting of blood or coffee ground material  New chest pain or pain under the shoulder blades  Painful or persistently difficult swallowing  New shortness of breath  Fever of 100F or higher  Black, tarry-looking stools  For urgent or emergent issues, a gastroenterologist can be reached at any hour by calling [phone]. Do not use MyChart messaging for urgent concerns.    DIET:  We do recommend a small meal at first, but then you may proceed to your regular diet.  Drink plenty of fluids but you should avoid alcoholic beverages for 24 hours.  ACTIVITY:  You should plan to take it easy for the rest of today and you should NOT DRIVE or use heavy machinery until tomorrow (because of the sedation medicines used during the test).    FOLLOW UP: Our staff will call the number listed on your records the next business day following your procedure.  We will call around 7:15- 8:00 am to check on you and address any questions or concerns that you may have regarding the information given to you following your procedure. If we do not reach you, we will leave a message.     If any biopsies were taken you will  be contacted by phone or by letter within the next 1-3 weeks.  Please call us at [phone] if you have not heard about the biopsies in 3 weeks.    SIGNATURES/CONFIDENTIALITY: You and/or your care partner have signed paperwork which will be entered into your electronic medical record.  These signatures attest to the fact that that the information above on your After Visit Summary has been reviewed and is understood.  Full responsibility of the  confidentiality of this discharge information lies with you and/or your care-partner.

## 2022-07-11 NOTE — Progress Notes (Signed)
Called to room to assist during endoscopic procedure.  Patient ID and intended procedure confirmed with present staff. Received instructions for my participation in the procedure from the performing physician.  

## 2022-07-11 NOTE — Progress Notes (Signed)
Sedate, gd SR, tolerated procedure well, VSS, report to RN 

## 2022-07-11 NOTE — Op Note (Signed)
[facility] Patient Name: Pradeep Beaubrun Procedure Date: 07/11/2022 10:31 AM MRN: [id number] Endoscopist: Mauri Pole , MD, [id number] Age: 71 Referring MD:  Date of Birth: 1950-09-24 Gender: Male Account #: [id number] Procedure:                Upper GI endoscopy Indications:              Gastrointestinal bleeding of unknown origin,                            Suspected upper gastrointestinal bleeding in                            patient with unexplained iron deficiency anemia Medicines:                Monitored Anesthesia Care Procedure:                Pre-Anesthesia Assessment:                           - Prior to the procedure, a History and Physical                            was performed, and patient medications and                            allergies were reviewed. The patient's tolerance of                            previous anesthesia was also reviewed. The risks                            and benefits of the procedure and the sedation                            options and risks were discussed with the patient.                            All questions were answered, and informed consent                            was obtained. Prior Anticoagulants: The patient                            last took Plavix (clopidogrel) 5 days prior to the                            procedure. ASA Grade Assessment: II - A patient                            with mild systemic disease. After reviewing the                            risks and benefits, the patient was deemed in  satisfactory condition to undergo the procedure.                           After obtaining informed consent, the endoscope was                            passed under direct vision. Throughout the                            procedure, the patient's blood pressure, pulse, and                            oxygen saturations were monitored continuously. The                             Endoscope was introduced through the mouth, and                            advanced to the second part of duodenum. The upper                            GI endoscopy was accomplished without difficulty.                            The patient tolerated the procedure well. Scope In: Scope Out: Findings:                 LA Grade B (one or more mucosal breaks greater than                            5 mm, not extending between the tops of two mucosal                            folds) esophagitis with no bleeding was found 38 to                            39 cm from the incisors.                           A 2 cm hiatal hernia was present.                           Patchy mild inflammation characterized by adherent                            blood, congestion (edema), erosions and erythema                            was found in the entire examined stomach. Biopsies                            were taken with a cold forceps for histology.  Biopsies were taken with a cold forceps for                            Helicobacter pylori testing.                           The cardia and gastric fundus were normal on                            retroflexion.                           The examined duodenum was normal. Complications:            No immediate complications. Estimated Blood Loss:     Estimated blood loss was minimal. Impression:               - LA Grade B reflux esophagitis with no bleeding.                           - 2 cm hiatal hernia.                           - Gastritis. Biopsied.                           - Normal examined duodenum. Recommendation:           - Patient has a contact number available for                            emergencies. The signs and symptoms of potential                            delayed complications were discussed with the                            patient. Return to normal activities tomorrow.                            Written  discharge instructions were provided to the                            patient.                           - Resume previous diet.                           - Continue present medications.                           - Follow an antireflux regimen.                           - Await pathology results.                           -  Use Protonix (pantoprazole) 40 mg PO daily. Rx                            for 90 days with 3 refills                           - Follow up in GI office in 3 months                           - See the other procedure note for documentation of                            additional recommendations. Mauri Pole, MD 07/11/2022 11:08:35 AM This report has been signed electronically.

## 2022-07-11 NOTE — Op Note (Signed)
[facility] Patient Name: Ray Williams Procedure Date: 07/11/2022 10:30 AM MRN: [id number] Endoscopist: Mauri Pole , MD, [id number] Age: 71 Referring MD:  Date of Birth: 07-04-1951 Gender: Male Account #: [id number] Procedure:                Colonoscopy Indications:              Evaluation of unexplained GI bleeding presenting                            with fecal occult blood, Unexplained iron                            deficiency anemia Medicines:                Monitored Anesthesia Care Procedure:                Pre-Anesthesia Assessment:                           - Prior to the procedure, a History and Physical                            was performed, and patient medications and                            allergies were reviewed. The patient's tolerance of                            previous anesthesia was also reviewed. The risks                            and benefits of the procedure and the sedation                            options and risks were discussed with the patient.                            All questions were answered, and informed consent                            was obtained. Prior Anticoagulants: The patient                            last took Plavix (clopidogrel) 5 days prior to the                            procedure. ASA Grade Assessment: II - A patient                            with mild systemic disease. After reviewing the                            risks and benefits, the patient was deemed in  satisfactory condition to undergo the procedure.                           After obtaining informed consent, the colonoscope                            was passed under direct vision. Throughout the                            procedure, the patient's blood pressure, pulse, and                            oxygen saturations were monitored continuously. The                            Olympus PCF-[id number])  Colonoscope was                            introduced through the anus and advanced to the the                            cecum, identified by appendiceal orifice and                            ileocecal valve. The colonoscopy was performed                            without difficulty. The patient tolerated the                            procedure well. The quality of the bowel                            preparation was good. The ileocecal valve,                            appendiceal orifice, and rectum were photographed. Scope In: 10:48:22 AM Scope Out: 11:00:36 AM Scope Withdrawal Time: 0 hours 10 minutes 47 seconds  Total Procedure Duration: 0 hours 12 minutes 14 seconds  Findings:                 The perianal and digital rectal examinations were                            normal.                           Two sessile polyps were found in the rectum and                            transverse colon. The polyps were 3 to 5 mm in                            size. These polyps were removed with a cold snare.  Resection and retrieval were complete.                           Scattered medium-mouthed diverticula were found in                            the sigmoid colon, descending colon, transverse                            colon and ascending colon. There was evidence of                            diverticular spasm. There was evidence of an                            impacted diverticulum. There was no evidence of                            diverticular bleeding.                           Non-bleeding external and internal hemorrhoids were                            found during retroflexion. The hemorrhoids were                            medium-sized. Complications:            No immediate complications. Estimated Blood Loss:     Estimated blood loss was minimal. Impression:               - Two 3 to 5 mm polyps in the rectum and in the                             transverse colon, removed with a cold snare.                            Resected and retrieved.                           - Moderate diverticulosis in the sigmoid colon, in                            the descending colon, in the transverse colon and                            in the ascending colon. There was evidence of                            diverticular spasm. There was evidence of an                            impacted diverticulum. There was no evidence of  diverticular bleeding.                           - Non-bleeding external and internal hemorrhoids. Recommendation:           - Resume previous diet.                           - Continue present medications.                           - Await pathology results.                           - Repeat colonoscopy in 5-10 years for surveillance                            based on pathology results.                           - Resume Plavix (clopidogrel) at prior dose                            tomorrow. Refer to managing physician for further                            adjustment of therapy. Mauri Pole, MD 07/11/2022 11:04:45 AM This report has been signed electronically.

## 2022-07-13 ENCOUNTER — Telehealth: Payer: Self-pay | Admitting: *Deleted

## 2022-07-13 NOTE — Telephone Encounter (Signed)
Left message on f/u call 

## 2022-07-16 ENCOUNTER — Encounter: Payer: Self-pay | Admitting: Gastroenterology

## 2022-07-20 NOTE — Assessment & Plan Note (Signed)
Encouraged complete cessation, he declines assistance and is not ready to quit at this time. Also declines vaccinations

## 2022-07-20 NOTE — Assessment & Plan Note (Addendum)
Encourage heart healthy diet such as MIND or DASH diet, increase exercise, avoid trans fats, simple carbohydrates and processed foods, consider a krill or fish or flaxseed oil cap daily. Tolerating Atorvastatin 

## 2022-07-20 NOTE — Assessment & Plan Note (Signed)
hgba1c acceptable, minimize simple carbs. Increase exercise as tolerated.  

## 2022-07-20 NOTE — Assessment & Plan Note (Signed)
Supplement and monitor 

## 2022-07-20 NOTE — Assessment & Plan Note (Signed)
Well controlled, no changes to meds. Encouraged heart healthy diet such as the DASH diet and exercise as tolerated.  °

## 2022-07-21 ENCOUNTER — Ambulatory Visit (INDEPENDENT_AMBULATORY_CARE_PROVIDER_SITE_OTHER): Payer: Medicare HMO | Admitting: Family Medicine

## 2022-07-21 VITALS — BP 110/64 | HR 87 | Temp 97.0°F | Resp 16 | Ht 66.0 in | Wt 156.6 lb

## 2022-07-21 DIAGNOSIS — I10 Essential (primary) hypertension: Secondary | ICD-10-CM | POA: Diagnosis not present

## 2022-07-21 DIAGNOSIS — Z720 Tobacco use: Secondary | ICD-10-CM | POA: Diagnosis not present

## 2022-07-21 DIAGNOSIS — R739 Hyperglycemia, unspecified: Secondary | ICD-10-CM

## 2022-07-21 DIAGNOSIS — E538 Deficiency of other specified B group vitamins: Secondary | ICD-10-CM

## 2022-07-21 DIAGNOSIS — E782 Mixed hyperlipidemia: Secondary | ICD-10-CM

## 2022-07-21 LAB — COMPREHENSIVE METABOLIC PANEL
ALT: 18 U/L (ref 0–53)
AST: 9 U/L (ref 0–37)
Albumin: 4.4 g/dL (ref 3.5–5.2)
Alkaline Phosphatase: 82 U/L (ref 39–117)
BUN: 29 mg/dL — ABNORMAL HIGH (ref 6–23)
CO2: 27 mEq/L (ref 19–32)
Calcium: 9.3 mg/dL (ref 8.4–10.5)
Chloride: 104 mEq/L (ref 96–112)
Creatinine, Ser: 1.64 mg/dL — ABNORMAL HIGH (ref 0.40–1.50)
GFR: 42.17 mL/min — ABNORMAL LOW (ref >60.00)
Glucose, Bld: 89 mg/dL (ref 70–99)
Potassium: 4.9 mEq/L (ref 3.5–5.1)
Sodium: 138 mEq/L (ref 135–145)
Total Bilirubin: 0.4 mg/dL (ref 0.2–1.2)
Total Protein: 7 g/dL (ref 6.0–8.3)

## 2022-07-21 LAB — CBC WITH DIFFERENTIAL/PLATELET
Basophils Absolute: 0.1 10*3/uL (ref 0.0–0.1)
Basophils Relative: 0.8 % (ref 0.0–3.0)
Eosinophils Absolute: 0.3 10*3/uL (ref 0.0–0.7)
Eosinophils Relative: 4.7 % (ref 0.0–5.0)
HCT: 38.5 % — ABNORMAL LOW (ref 39.0–52.0)
Hemoglobin: 12.8 g/dL — ABNORMAL LOW (ref 13.0–17.0)
Lymphocytes Relative: 20.2 % (ref 12.0–46.0)
Lymphs Abs: 1.3 10*3/uL (ref 0.7–4.0)
MCHC: 33.3 g/dL (ref 30.0–36.0)
MCV: 89.3 fl (ref 78.0–100.0)
Monocytes Absolute: 0.5 10*3/uL (ref 0.1–1.0)
Monocytes Relative: 7.4 % (ref 3.0–12.0)
Neutro Abs: 4.4 10*3/uL (ref 1.4–7.7)
Neutrophils Relative %: 66.9 % (ref 43.0–77.0)
Platelets: 295 10*3/uL (ref 150.0–400.0)
RBC: 4.3 Mil/uL (ref 4.22–5.81)
RDW: 14 % (ref 11.5–15.5)
WBC: 6.6 10*3/uL (ref 4.0–10.5)

## 2022-07-21 LAB — HEMOGLOBIN A1C: Hgb A1c MFr Bld: 6.2 % (ref 4.6–6.5)

## 2022-07-21 LAB — LIPID PANEL
Cholesterol: 138 mg/dL (ref 0–200)
HDL: 32.1 mg/dL — ABNORMAL LOW (ref >39.00)
LDL Cholesterol: 80 mg/dL (ref 0–99)
NonHDL: 105.42
Total CHOL/HDL Ratio: 4
Triglycerides: 127 mg/dL (ref 0.0–149.0)
VLDL: 25.4 mg/dL (ref 0.0–40.0)

## 2022-07-21 LAB — TSH: TSH: 0.96 u[IU]/mL (ref 0.35–5.50)

## 2022-07-21 LAB — VITAMIN B12: Vitamin B-12: >1500 pg/mL — ABNORMAL HIGH (ref 211–911)

## 2022-07-21 MED ORDER — CYANOCOBALAMIN 1000 MCG/ML IJ SOLN
1000.0000 ug | Freq: Once | INTRAMUSCULAR | Status: AC
  Administered 2022-07-21: 1000 ug via INTRAMUSCULAR

## 2022-07-21 NOTE — Progress Notes (Signed)
Subjective:   By signing my name below, I, Kellie Simmering, attest that this documentation has been prepared under the direction and in the presence of Mosie Lukes, MD., 07/21/2022.     Patient ID: Ray Williams, male    DOB: Dec 09, 1950, 71 y.o.   MRN: [id number]  No chief complaint on file.  HPI Patient is in today for an office visit. Denies CP/ palp/ SOB/ HA/ congestion/fevers/ GU c/o.  B12 Deficiency Patient will receive his monthly B12 injection today.  Hematoma Patient was seen by Dr. Raeford Razor on 05/12/2022 who aspirated his hematoma in the right flank. The procedure was successful and he reports having no recent pain.  Immunizations He is not interested in receiving any immunizations at this time.  GERD He is currently taking Pantoprazole 40 mg to manage his GERD.  Social History He does not drink alcohol but is still smoking multiple packs of cigarettes weekly.  Past Medical History:  Diagnosis Date   Anemia 01/26/2018   Anxiety    Depression    Depression with anxiety 01/08/2016   H/O ischemic right MCA stroke    History of chicken pox 05/11/2017   Hyperlipidemia    Hypertension    Liver disease    hepatitis c   Preventative health care 08/31/2016   Right shoulder pain 05/11/2017   Stroke [facility])    Past Surgical History:  Procedure Laterality Date   CATARACT EXTRACTION, BILATERAL  2022   EYE SURGERY Bilateral 2021   cataracts   Family History  Problem Relation Age of Onset   Heart disease Mother        MI   Heart attack Mother    Asthma Mother    Hyperlipidemia Mother    Hypertension Mother    Diabetes Father    Hyperlipidemia Sister    Healthy Maternal Grandmother    Healthy Maternal Grandfather    Healthy Paternal Grandmother    Healthy Paternal Grandfather    Colon cancer Neg Hx    Stomach cancer Neg Hx    Esophageal cancer Neg Hx    Social History   Socioeconomic History   Marital status: Married    Spouse name: Not on file    Number of children: 2   Years of education: 12   Highest education level: Not on file  Occupational History   Occupation: Teaching laboratory technician  Tobacco Use   Smoking status: Every Day    Packs/day: 0.50    Types: Cigarettes   Smokeless tobacco: Never  Vaping Use   Vaping Use: Never used  Substance and Sexual Activity   Alcohol use: No    Alcohol/week: 0.0 standard drinks of alcohol   Drug use: No   Sexual activity: Not Currently  Other Topics Concern   Not on file  Social History Narrative   Fun: Fish, collectable cars/classic cars   Denies religious beliefs effecting health care   Retired from Therapist, occupational   Lives with wife   No dietary restrictions, wears seat belt.    Social Determinants of Health   Financial Resource Strain: Low Risk  (10/27/2021)   Overall Financial Resource Strain (CARDIA)    Difficulty of Paying Living Expenses: Not hard at all  Food Insecurity: No Food Insecurity (10/27/2021)   Hunger Vital Sign    Worried About Running Out of Food in the Last Year: Never true    Ran Out of Food in the Last Year: Never true  Transportation Needs: No Transportation Needs (10/27/2021)  PRAPARE - Hydrologist (Medical): No    Lack of Transportation (Non-Medical): No  Physical Activity: Inactive (10/27/2021)   Exercise Vital Sign    Days of Exercise per Week: 0 days    Minutes of Exercise per Session: 0 min  Stress: No Stress Concern Present (10/27/2021)   [place]    Feeling of Stress : Not at all  Social Connections: Moderately Isolated (10/27/2021)   Social Connection and Isolation Panel [NHANES]    Frequency of Communication with Friends and Family: More than three times a week    Frequency of Social Gatherings with Friends and Family: More than three times a week    Attends Religious Services: Never    Marine scientist or Organizations: No    Attends  Archivist Meetings: Never    Marital Status: Married  Human resources officer Violence: Not At Risk (10/27/2021)   Humiliation, Afraid, Rape, and Kick questionnaire    Fear of Current or Ex-Partner: No    Emotionally Abused: No    Physically Abused: No    Sexually Abused: No   Outpatient Medications Prior to Visit  Medication Sig Dispense Refill   atorvastatin (LIPITOR) 80 MG tablet Take 1 tablet (80 mg total) by mouth daily. 90 tablet 1   citalopram (CELEXA) 20 MG tablet Take 1 tablet (20 mg total) by mouth daily. 90 tablet 1   clopidogrel (PLAVIX) 75 MG tablet Take 1 tablet by mouth once daily 90 tablet 1   cyanocobalamin (VITAMIN B12) 1000 MCG/ML injection Inject 1,000 mcg into the muscle once.     lisinopril-hydrochlorothiazide (ZESTORETIC) 20-25 MG tablet Take 1 tablet by mouth once daily 90 tablet 1   pantoprazole (PROTONIX) 40 MG tablet Take 1 tablet (40 mg total) by mouth daily. Take 1 tablet 30 minutes before first meal of day 90 tablet 3   No facility-administered medications prior to visit.   No Known Allergies  Review of Systems  Constitutional:  Negative for chills and fever.  HENT:  Negative for congestion.   Respiratory:  Negative for shortness of breath.   Cardiovascular:  Negative for chest pain and palpitations.  Gastrointestinal:  Positive for heartburn. Negative for abdominal pain, blood in stool, constipation, diarrhea, nausea and vomiting.  Genitourinary:  Negative for dysuria, frequency, hematuria and urgency.  Skin:           Neurological:  Negative for headaches.      Objective:    Physical Exam Constitutional:      General: He is not in acute distress.    Appearance: Normal appearance. He is normal weight. He is not ill-appearing.  HENT:     Head: Normocephalic and atraumatic.     Right Ear: External ear normal.     Left Ear: External ear normal.     Nose: Nose normal.     Mouth/Throat:     Mouth: Mucous membranes are moist.     Pharynx:  Oropharynx is clear.  Eyes:     General:        Right eye: No discharge.        Left eye: No discharge.     Extraocular Movements: Extraocular movements intact.     Conjunctiva/sclera: Conjunctivae normal.     Pupils: Pupils are equal, round, and reactive to light.  Cardiovascular:     Rate and Rhythm: Normal rate and regular rhythm.     Pulses: Normal pulses.  Heart sounds: Normal heart sounds. No murmur heard.    No gallop.  Pulmonary:     Effort: Pulmonary effort is normal. No respiratory distress.     Breath sounds: Normal breath sounds. No wheezing or rales.  Abdominal:     General: Bowel sounds are normal.     Palpations: Abdomen is soft.     Tenderness: There is no abdominal tenderness. There is no guarding.  Musculoskeletal:        General: Normal range of motion.     Cervical back: Normal range of motion.     Right lower leg: No edema.     Left lower leg: No edema.  Skin:    General: Skin is warm and dry.  Neurological:     Mental Status: He is alert and oriented to person, place, and time.  Psychiatric:        Mood and Affect: Mood normal.        Behavior: Behavior normal.        Judgment: Judgment normal.    There were no vitals taken for this visit. Wt Readings from Last 3 Encounters:  07/11/22 155 lb (70.3 kg)  06/23/22 155 lb (70.3 kg)  05/19/22 158 lb (71.7 kg)   Diabetic Foot Exam - Simple   No data filed    Lab Results  Component Value Date   WBC 6.4 06/23/2022   HGB 12.0 (L) 06/23/2022   HCT 36.1 (L) 06/23/2022   PLT 272.0 06/23/2022   GLUCOSE 83 06/23/2022   CHOL 119 12/18/2021   TRIG 114.0 12/18/2021   HDL 32.30 (L) 12/18/2021   LDLDIRECT 60.0 01/24/2018   LDLCALC 64 12/18/2021   ALT 14 06/23/2022   AST 7 06/23/2022   NA 135 06/23/2022   K 4.3 06/23/2022   CL 104 06/23/2022   CREATININE 1.29 06/23/2022   BUN 23 06/23/2022   CO2 25 06/23/2022   TSH 0.88 12/18/2021   PSA 1.31 02/27/2021   INR 0.96 01/08/2016   HGBA1C 6.0  03/20/2022   Lab Results  Component Value Date   TSH 0.88 12/18/2021   Lab Results  Component Value Date   WBC 6.4 06/23/2022   HGB 12.0 (L) 06/23/2022   HCT 36.1 (L) 06/23/2022   MCV 90.5 06/23/2022   PLT 272.0 06/23/2022   Lab Results  Component Value Date   NA 135 06/23/2022   K 4.3 06/23/2022   CO2 25 06/23/2022   GLUCOSE 83 06/23/2022   BUN 23 06/23/2022   CREATININE 1.29 06/23/2022   BILITOT 0.3 06/23/2022   ALKPHOS 78 06/23/2022   AST 7 06/23/2022   ALT 14 06/23/2022   PROT 7.1 06/23/2022   ALBUMIN 4.3 06/23/2022   CALCIUM 8.8 06/23/2022   ANIONGAP 9 05/02/2022   GFR 56.28 (L) 06/23/2022   Lab Results  Component Value Date   CHOL 119 12/18/2021   Lab Results  Component Value Date   HDL 32.30 (L) 12/18/2021   Lab Results  Component Value Date   LDLCALC 64 12/18/2021   Lab Results  Component Value Date   TRIG 114.0 12/18/2021   Lab Results  Component Value Date   CHOLHDL 4 12/18/2021   Lab Results  Component Value Date   HGBA1C 6.0 03/20/2022      Assessment & Plan:   He has been informed about receiving COVID-19, Influenza, Pneumonia, RSV, and Shingles immunizations.  A B12 injection will be administered today.  Routine lab work will be completed today.  Problem  List Items Addressed This Visit       Cardiovascular and Mediastinum   Essential hypertension     Other   Tobacco abuse   HLD (hyperlipidemia)   Hyperglycemia   B12 deficiency - Primary   No orders of the defined types were placed in this encounter.  I, Kellie Simmering, personally preformed the services described in this documentation.  All medical record entries made by the scribe were at my direction and in my presence.  I have reviewed the chart and discharge instructions (if applicable) and agree that the record reflects my personal performance and is accurate and complete. 07/21/2022  I,Mohammed Iqbal,acting as a scribe for Penni Homans, MD.,have documented all  relevant documentation on the behalf of Penni Homans, MD,as directed by  Penni Homans, MD while in the presence of Penni Homans, MD.  Kellie Simmering

## 2022-07-21 NOTE — Patient Instructions (Signed)

## 2022-07-22 ENCOUNTER — Other Ambulatory Visit: Payer: Self-pay

## 2022-07-22 DIAGNOSIS — E782 Mixed hyperlipidemia: Secondary | ICD-10-CM

## 2022-07-22 DIAGNOSIS — I10 Essential (primary) hypertension: Secondary | ICD-10-CM

## 2022-07-22 DIAGNOSIS — E538 Deficiency of other specified B group vitamins: Secondary | ICD-10-CM

## 2022-08-04 ENCOUNTER — Other Ambulatory Visit: Payer: Self-pay | Admitting: Family Medicine

## 2022-08-04 DIAGNOSIS — I6320 Cerebral infarction due to unspecified occlusion or stenosis of unspecified precerebral arteries: Secondary | ICD-10-CM

## 2022-08-04 DIAGNOSIS — I10 Essential (primary) hypertension: Secondary | ICD-10-CM

## 2022-08-07 DIAGNOSIS — R0981 Nasal congestion: Secondary | ICD-10-CM | POA: Diagnosis not present

## 2022-08-07 DIAGNOSIS — R051 Acute cough: Secondary | ICD-10-CM | POA: Diagnosis not present

## 2022-08-07 DIAGNOSIS — J209 Acute bronchitis, unspecified: Secondary | ICD-10-CM | POA: Diagnosis not present

## 2022-08-07 DIAGNOSIS — J069 Acute upper respiratory infection, unspecified: Secondary | ICD-10-CM | POA: Diagnosis not present

## 2022-08-21 ENCOUNTER — Other Ambulatory Visit (INDEPENDENT_AMBULATORY_CARE_PROVIDER_SITE_OTHER): Payer: Medicare HMO

## 2022-08-21 DIAGNOSIS — E782 Mixed hyperlipidemia: Secondary | ICD-10-CM

## 2022-08-21 LAB — COMPREHENSIVE METABOLIC PANEL
ALT: 22 U/L (ref 0–53)
AST: 7 U/L (ref 0–37)
Albumin: 4.2 g/dL (ref 3.5–5.2)
Alkaline Phosphatase: 94 U/L (ref 39–117)
BUN: 30 mg/dL — ABNORMAL HIGH (ref 6–23)
CO2: 25 mEq/L (ref 19–32)
Calcium: 9.6 mg/dL (ref 8.4–10.5)
Chloride: 104 mEq/L (ref 96–112)
Creatinine, Ser: 1.44 mg/dL (ref 0.40–1.50)
GFR: 49.27 mL/min — ABNORMAL LOW (ref >60.00)
Glucose, Bld: 89 mg/dL (ref 70–99)
Potassium: 5.2 mEq/L — ABNORMAL HIGH (ref 3.5–5.1)
Sodium: 138 mEq/L (ref 135–145)
Total Bilirubin: 0.4 mg/dL (ref 0.2–1.2)
Total Protein: 7.2 g/dL (ref 6.0–8.3)

## 2022-09-15 ENCOUNTER — Other Ambulatory Visit: Payer: Self-pay

## 2022-09-15 ENCOUNTER — Telehealth: Payer: Self-pay | Admitting: Family Medicine

## 2022-09-15 MED ORDER — ATORVASTATIN CALCIUM 80 MG PO TABS: 80.0000 mg | ORAL_TABLET | Freq: Every day | ORAL | 1 refills | Status: DC

## 2022-09-15 MED ORDER — CITALOPRAM HYDROBROMIDE 20 MG PO TABS: 20.0000 mg | ORAL_TABLET | Freq: Every day | ORAL | 1 refills | Status: DC

## 2022-09-15 NOTE — Telephone Encounter (Signed)
Medication sent in. 

## 2022-09-15 NOTE — Telephone Encounter (Signed)
Prescription Request  09/15/2022  Is this a "Controlled Substance" medicine? No  LOV: 07/21/2022  What is the name of the medication or equipment?   atorvastatin (LIPITOR) 80 MG tablet [[id number]]   citalopram (CELEXA) 20 MG tablet [[id number]]   Have you contacted your pharmacy to request a refill? Yes   Which pharmacy would you like this sent to?  [facility], [address] [place] [address] Phone: [phone] Fax: [phone]    Patient notified that their request is being sent to the clinical staff for review and that they should receive a response within 2 business days.   Please advise at Mobile [phone] (mobile)

## 2022-09-21 ENCOUNTER — Other Ambulatory Visit: Payer: Medicare HMO

## 2022-10-08 NOTE — Progress Notes (Incomplete)
Ray Williams    [place]:[id number]    Sep 09, 1950  Primary Care Physician:Blyth, Bonnita Levan, MD  Referring Physician: Mosie Lukes, MD [address] [place],  [place] [address]   Chief complaint:  ***  HPI: Mr. Nelan was previously seen by PA lori Hvozodvic for an anal fissure and rectal bleeding on 9\2016. He was also seen by PA Lemmon on 06/23/2022 for hemoccult positive stole. He has a history of anemia and a stroke of Plavix. He had a colonscopy in 07/11/2022 done by me.    Ray Williams is a 72 y.o. male presenting to clinic today for  ***     GI Hx:  EGD 07/11/2022 1. Surgical [P], gastric antrum and gastric body - ANTRAL AND OXYNTIC MUCOSA WITH NO SIGNIFICANT PATHOLOGY. - NO HELICOBACTER PYLORI ORGANISMS IDENTIFIED ON H&E STAINED SLIDE. 2. Surgical [P], colon, rectum and transverse, polyp (2) - TUBULAR ADENOMA. - HYPERPLASTIC POLYP.  Colonoscopy 07/11/2022  - Two 3 to 5 mm polyps in the rectum and in the transverse colon, removed with a cold snare. Resected and retrieved.  - Moderate diverticulosis in the sigmoid colon, in the descending colon, in the transverse colon and in the ascending colon. There was evidence of diverticular spasm. There was evidence of an impacted diverticulum. There was no evidence of diverticular bleeding.  - Non-bleeding external and internal hemorrhoids.  Pathology 07/11/2022  1. Surgical [P], gastric antrum and gastric body - ANTRAL AND OXYNTIC MUCOSA WITH NO SIGNIFICANT PATHOLOGY. - NO HELICOBACTER PYLORI ORGANISMS IDENTIFIED ON H&E STAINED SLIDE. 2. Surgical [P], colon, rectum and transverse, polyp (2) - TUBULAR ADENOMA. - HYPERPLASTIC POLYP.   Current Outpatient Medications:    atorvastatin (LIPITOR) 80 MG tablet, Take 1 tablet (80 mg total) by mouth daily., Disp: 90 tablet, Rfl: 1   citalopram (CELEXA) 20 MG tablet, Take 1 tablet (20 mg total) by mouth daily., Disp: 90 tablet, Rfl: 1   clopidogrel (PLAVIX) 75 MG  tablet, Take 1 tablet by mouth once daily, Disp: 90 tablet, Rfl: 0   cyanocobalamin (VITAMIN B12) 1000 MCG/ML injection, Inject 1,000 mcg into the muscle once., Disp: , Rfl:    lisinopril-hydrochlorothiazide (ZESTORETIC) 20-25 MG tablet, Take 1 tablet by mouth once daily, Disp: 90 tablet, Rfl: 0   pantoprazole (PROTONIX) 40 MG tablet, Take 1 tablet (40 mg total) by mouth daily. Take 1 tablet 30 minutes before first meal of day, Disp: 90 tablet, Rfl: 3   Allergies as of 10/09/2022   (No Known Allergies)    Past Medical History:  Diagnosis Date   Anemia 01/26/2018   Anxiety    Depression    Depression with anxiety 01/08/2016   H/O ischemic right MCA stroke    History of chicken pox 05/11/2017   Hyperlipidemia    Hypertension    Liver disease    hepatitis c   Preventative health care 08/31/2016   Right shoulder pain 05/11/2017   Stroke [facility])     Past Surgical History:  Procedure Laterality Date   CATARACT EXTRACTION, BILATERAL  2022   EYE SURGERY Bilateral 2021   cataracts    Family History  Problem Relation Age of Onset   Heart disease Mother        MI   Heart attack Mother    Asthma Mother    Hyperlipidemia Mother    Hypertension Mother    Diabetes Father    Hyperlipidemia Sister    Healthy  Maternal Grandmother    Healthy Maternal Grandfather    Healthy Paternal Grandmother    Healthy Paternal Grandfather    Colon cancer Neg Hx    Stomach cancer Neg Hx    Esophageal cancer Neg Hx     Social History   Socioeconomic History   Marital status: Married    Spouse name: Not on file   Number of children: 2   Years of education: 12   Highest education level: Not on file  Occupational History   Occupation: Teaching laboratory technician  Tobacco Use   Smoking status: Every Day    Packs/day: 0.50    Types: Cigarettes   Smokeless tobacco: Never  Vaping Use   Vaping Use: Never used  Substance and Sexual Activity   Alcohol use: No    Alcohol/week: 0.0 standard drinks of  alcohol   Drug use: No   Sexual activity: Not Currently  Other Topics Concern   Not on file  Social History Narrative   Fun: Fish, collectable cars/classic cars   Denies religious beliefs effecting health care   Retired from Therapist, occupational   Lives with wife   No dietary restrictions, wears seat belt.    Social Determinants of Health   Financial Resource Strain: Low Risk  (10/27/2021)   Overall Financial Resource Strain (CARDIA)    Difficulty of Paying Living Expenses: Not hard at all  Food Insecurity: No Food Insecurity (10/27/2021)   Hunger Vital Sign    Worried About Running Out of Food in the Last Year: Never true    Ran Out of Food in the Last Year: Never true  Transportation Needs: No Transportation Needs (10/27/2021)   PRAPARE - Hydrologist (Medical): No    Lack of Transportation (Non-Medical): No  Physical Activity: Inactive (10/27/2021)   Exercise Vital Sign    Days of Exercise per Week: 0 days    Minutes of Exercise per Session: 0 min  Stress: No Stress Concern Present (10/27/2021)   [place]    Feeling of Stress : Not at all  Social Connections: Moderately Isolated (10/27/2021)   Social Connection and Isolation Panel [NHANES]    Frequency of Communication with Friends and Family: More than three times a week    Frequency of Social Gatherings with Friends and Family: More than three times a week    Attends Religious Services: Never    Marine scientist or Organizations: No    Attends Archivist Meetings: Never    Marital Status: Married  Human resources officer Violence: Not At Risk (10/27/2021)   Humiliation, Afraid, Rape, and Kick questionnaire    Fear of Current or Ex-Partner: No    Emotionally Abused: No    Physically Abused: No    Sexually Abused: No      Review of systems: Review of Systems    Physical Exam: General: well-appearing ***   Eyes: sclera anicteric, no redness ENT: oral mucosa moist without lesions, no cervical or supraclavicular lymphadenopathy CV: RRR, no JVD, no peripheral edema Resp: clear to auscultation bilaterally, normal RR and effort noted GI: soft, no tenderness, with active bowel sounds. No guarding or palpable organomegaly noted. Skin; warm and dry, no rash or jaundice noted Neuro: awake, alert and oriented x 3. Normal gross motor function and fluent speech   Data Reviewed:  Reviewed labs, radiology imaging, old records and pertinent past GI work up   Assessment and Plan/Recommendations:  ***  This visit required *** minutes of patient care (this includes precharting, chart review, review of results, face-to-face time used for counseling as well as treatment plan and follow-up. The patient was provided an opportunity to ask questions and all were answered. The patient agreed with the plan and demonstrated an understanding of the instructions.  Damaris Hippo , MD  CC: Mosie Lukes, MD   Joretta Bachelor Johnston Ebbs as a scribe for Harl Bowie, MD.,have documented all relevant documentation on the behalf of Harl Bowie, MD,as directed by  Harl Bowie, MD while in the presence of Harl Bowie, MD.   I, Harl Bowie, MD, have reviewed all documentation for this visit. The documentation on 10/08/22 for the exam, diagnosis, procedures, and orders are all accurate and complete. ***

## 2022-10-09 ENCOUNTER — Ambulatory Visit: Payer: Medicare HMO | Admitting: Gastroenterology

## 2022-10-15 ENCOUNTER — Other Ambulatory Visit: Payer: Self-pay | Admitting: Family Medicine

## 2022-10-15 ENCOUNTER — Telehealth: Payer: Self-pay | Admitting: Family Medicine

## 2022-10-15 DIAGNOSIS — I10 Essential (primary) hypertension: Secondary | ICD-10-CM

## 2022-10-15 MED ORDER — CITALOPRAM HYDROBROMIDE 20 MG PO TABS: 20.0000 mg | ORAL_TABLET | Freq: Every day | ORAL | 1 refills | Status: DC

## 2022-10-15 MED ORDER — ATORVASTATIN CALCIUM 80 MG PO TABS: 80.0000 mg | ORAL_TABLET | Freq: Every day | ORAL | 1 refills | Status: DC

## 2022-10-15 NOTE — Telephone Encounter (Signed)
Medication sent.

## 2022-10-15 NOTE — Telephone Encounter (Signed)
Prescription Request  10/15/2022  Is this a "Controlled Substance" medicine? No  LOV: 07/21/2022  What is the name of the medication or equipment?  citalopram (CELEXA) 20 MG tablet   lisinopril-hydrochlorothiazide (ZESTORETIC) 20-25 MG tablet   pantoprazole (PROTONIX) 40 MG tablet   Have you contacted your pharmacy to request a refill? No   Which pharmacy would you like this sent to?  [facility], [place] [address] Phone: [phone] Fax: [phone]    Patient notified that their request is being sent to the clinical staff for review and that they should receive a response within 2 business days.   Please advise at Mobile [phone] (mobile)

## 2022-10-22 ENCOUNTER — Telehealth: Payer: Self-pay | Admitting: Family Medicine

## 2022-10-22 NOTE — Telephone Encounter (Signed)
Contacted Axavier Partridge to schedule their annual wellness visit. Appointment made for 11/02/2022.  Sherol Dade; Care Guide Ambulatory [facility] Direct Dial: [phone]

## 2022-11-02 ENCOUNTER — Other Ambulatory Visit: Payer: Self-pay | Admitting: Family Medicine

## 2022-11-02 ENCOUNTER — Ambulatory Visit (INDEPENDENT_AMBULATORY_CARE_PROVIDER_SITE_OTHER): Payer: Medicare HMO | Admitting: *Deleted

## 2022-11-02 DIAGNOSIS — Z0000 Encounter for general adult medical examination without abnormal findings: Secondary | ICD-10-CM

## 2022-11-02 DIAGNOSIS — I6320 Cerebral infarction due to unspecified occlusion or stenosis of unspecified precerebral arteries: Secondary | ICD-10-CM

## 2022-11-02 NOTE — Patient Instructions (Signed)
[name] , Thank you for taking time to come for your Medicare Wellness Visit. I appreciate your ongoing commitment to your health goals. Please review the following plan we discussed and let me know if I can assist you in the future.   These are the goals we discussed:  Goals      Patient Stated     Maintain current health        This is a list of the screening recommended for you and due dates:  Health Maintenance  Topic Date Due   DTaP/Tdap/Td vaccine (1 - Tdap) Never done   Zoster (Shingles) Vaccine (1 of 2) Never done   Pneumonia Vaccine (3 of 3 - PPSV23 or PCV20) 03/31/2021   Flu Shot  11/08/2022*   Medicare Annual Wellness Visit  11/02/2023   Cologuard (Stool DNA test)  08/30/2024   Hepatitis C Screening: USPSTF Recommendation to screen - Ages 18-79 yo.  Completed   HPV Vaccine  Aged Out   Colon Cancer Screening  Discontinued   COVID-19 Vaccine  Discontinued  *Topic was postponed. The date shown is not the original due date.     Next appointment: Follow up in one year for your annual wellness visit.   Preventive Care 7 Years and Older, Male Preventive care refers to lifestyle choices and visits with your health care provider that can promote health and wellness. What does preventive care include? A yearly physical exam. This is also called an annual well check. Dental exams once or twice a year. Routine eye exams. Ask your health care provider how often you should have your eyes checked. Personal lifestyle choices, including: Daily care of your teeth and gums. Regular physical activity. Eating a healthy diet. Avoiding tobacco and drug use. Limiting alcohol use. Practicing safe sex. Taking low doses of aspirin every day. Taking vitamin and mineral supplements as recommended by your health care provider. What happens during an annual well check? The services and screenings done by your health care provider during your annual well check will depend on your age,  overall health, lifestyle risk factors, and family history of disease. Counseling  Your health care provider may ask you questions about your: Alcohol use. Tobacco use. Drug use. Emotional well-being. Home and relationship well-being. Sexual activity. Eating habits. History of falls. Memory and ability to understand (cognition). Work and work Statistician. Screening  You may have the following tests or measurements: Height, weight, and BMI. Blood pressure. Lipid and cholesterol levels. These may be checked every 5 years, or more frequently if you are over 57 years old. Skin check. Lung cancer screening. You may have this screening every year starting at age 42 if you have a 30-pack-year history of smoking and currently smoke or have quit within the past 15 years. Fecal occult blood test (FOBT) of the stool. You may have this test every year starting at age 43. Flexible sigmoidoscopy or colonoscopy. You may have a sigmoidoscopy every 5 years or a colonoscopy every 10 years starting at age 74. Prostate cancer screening. Recommendations will vary depending on your family history and other risks. Hepatitis C blood test. Hepatitis B blood test. Sexually transmitted disease (STD) testing. Diabetes screening. This is done by checking your blood sugar (glucose) after you have not eaten for a while (fasting). You may have this done every 1-3 years. Abdominal aortic aneurysm (AAA) screening. You may need this if you are a current or former smoker. Osteoporosis. You may be screened starting at age 47  if you are at high risk. Talk with your health care provider about your test results, treatment options, and if necessary, the need for more tests. Vaccines  Your health care provider may recommend certain vaccines, such as: Influenza vaccine. This is recommended every year. Tetanus, diphtheria, and acellular pertussis (Tdap, Td) vaccine. You may need a Td booster every 10 years. Zoster vaccine.  You may need this after age 43. Pneumococcal 13-valent conjugate (PCV13) vaccine. One dose is recommended after age 30. Pneumococcal polysaccharide (PPSV23) vaccine. One dose is recommended after age 39. Talk to your health care provider about which screenings and vaccines you need and how often you need them. This information is not intended to replace advice given to you by your health care provider. Make sure you discuss any questions you have with your health care provider. Document Released: 08/23/2015 Document Revised: 04/15/2016 Document Reviewed: 05/28/2015 Elsevier Interactive Patient Education  2017 [place] Prevention in the Home Falls can cause injuries. They can happen to people of all ages. There are many things you can do to make your home safe and to help prevent falls. What can I do on the outside of my home? Regularly fix the edges of walkways and driveways and fix any cracks. Remove anything that might make you trip as you walk through a door, such as a raised step or threshold. Trim any bushes or trees on the path to your home. Use bright outdoor lighting. Clear any walking paths of anything that might make someone trip, such as rocks or tools. Regularly check to see if handrails are loose or broken. Make sure that both sides of any steps have handrails. Any raised decks and porches should have guardrails on the edges. Have any leaves, snow, or ice cleared regularly. Use sand or salt on walking paths during winter. Clean up any spills in your garage right away. This includes oil or grease spills. What can I do in the bathroom? Use night lights. Install grab bars by the toilet and in the tub and shower. Do not use towel bars as grab bars. Use non-skid mats or decals in the tub or shower. If you need to sit down in the shower, use a plastic, non-slip stool. Keep the floor dry. Clean up any water that spills on the floor as soon as it happens. Remove soap  buildup in the tub or shower regularly. Attach bath mats securely with double-sided non-slip rug tape. Do not have throw rugs and other things on the floor that can make you trip. What can I do in the bedroom? Use night lights. Make sure that you have a light by your bed that is easy to reach. Do not use any sheets or blankets that are too big for your bed. They should not hang down onto the floor. Have a firm chair that has side arms. You can use this for support while you get dressed. Do not have throw rugs and other things on the floor that can make you trip. What can I do in the kitchen? Clean up any spills right away. Avoid walking on wet floors. Keep items that you use a lot in easy-to-reach places. If you need to reach something above you, use a strong step stool that has a grab bar. Keep electrical cords out of the way. Do not use floor polish or wax that makes floors slippery. If you must use wax, use non-skid floor wax. Do not have throw rugs and other things  on the floor that can make you trip. What can I do with my stairs? Do not leave any items on the stairs. Make sure that there are handrails on both sides of the stairs and use them. Fix handrails that are broken or loose. Make sure that handrails are as long as the stairways. Check any carpeting to make sure that it is firmly attached to the stairs. Fix any carpet that is loose or worn. Avoid having throw rugs at the top or bottom of the stairs. If you do have throw rugs, attach them to the floor with carpet tape. Make sure that you have a light switch at the top of the stairs and the bottom of the stairs. If you do not have them, ask someone to add them for you. What else can I do to help prevent falls? Wear shoes that: Do not have high heels. Have rubber bottoms. Are comfortable and fit you well. Are closed at the toe. Do not wear sandals. If you use a stepladder: Make sure that it is fully opened. Do not climb a closed  stepladder. Make sure that both sides of the stepladder are locked into place. Ask someone to hold it for you, if possible. Clearly mark and make sure that you can see: Any grab bars or handrails. First and last steps. Where the edge of each step is. Use tools that help you move around (mobility aids) if they are needed. These include: Canes. Walkers. Scooters. Crutches. Turn on the lights when you go into a dark area. Replace any light bulbs as soon as they burn out. Set up your furniture so you have a clear path. Avoid moving your furniture around. If any of your floors are uneven, fix them. If there are any pets around you, be aware of where they are. Review your medicines with your doctor. Some medicines can make you feel dizzy. This can increase your chance of falling. Ask your doctor what other things that you can do to help prevent falls. This information is not intended to replace advice given to you by your health care provider. Make sure you discuss any questions you have with your health care provider. Document Released: 05/23/2009 Document Revised: 01/02/2016 Document Reviewed: 08/31/2014 Elsevier Interactive Patient Education  2017 [employer].

## 2022-11-02 NOTE — Progress Notes (Signed)
Subjective:   Ray Williams is a 72 y.o. male who presents for Medicare Annual/Subsequent preventive examination.  I connected with  [name] on 11/02/22 by a audio enabled telemedicine application and verified that I am speaking with the correct person using two identifiers.  Patient Location: Home  Provider Location: Office/Clinic  I discussed the limitations of evaluation and management by telemedicine. The patient expressed understanding and agreed to proceed.   Review of Systems     Cardiac Risk Factors include: advanced age (>82men, >42 women);male gender;dyslipidemia;hypertension     Objective:    There were no vitals filed for this visit. There is no height or weight on file to calculate BMI.     11/02/2022    3:42 PM 05/02/2022    3:47 PM 10/27/2021    3:20 PM 04/05/2015   12:46 PM 01/27/2015    7:00 PM  Advanced Directives  Does Patient Have a Medical Advance Directive? Yes Yes Yes No Yes  Type of Paramedic of [place];Living will [place];Living will [place];Living will    Does patient want to make changes to medical advance directive? No - Patient declined      Copy of [place] in Chart? No - copy requested  No - copy requested      Current Medications (verified) Outpatient Encounter Medications as of 11/02/2022  Medication Sig   atorvastatin (LIPITOR) 80 MG tablet Take 1 tablet (80 mg total) by mouth daily.   citalopram (CELEXA) 20 MG tablet Take 1 tablet (20 mg total) by mouth daily.   clopidogrel (PLAVIX) 75 MG tablet Take 1 tablet by mouth once daily   cyanocobalamin (VITAMIN B12) 1000 MCG/ML injection Inject 1,000 mcg into the muscle once.   lisinopril-hydrochlorothiazide (ZESTORETIC) 20-25 MG tablet Take 1 tablet by mouth once daily   pantoprazole (PROTONIX) 40 MG tablet Take 1 tablet (40 mg total) by mouth daily. Take 1 tablet 30 minutes before first meal of day    No facility-administered encounter medications on file as of 11/02/2022.    Allergies (verified) Patient has no known allergies.   History: Past Medical History:  Diagnosis Date   Anemia 01/26/2018   Anxiety    Depression    Depression with anxiety 01/08/2016   H/O ischemic right MCA stroke    History of chicken pox 05/11/2017   Hyperlipidemia    Hypertension    Liver disease    hepatitis c   Preventative health care 08/31/2016   Right shoulder pain 05/11/2017   Stroke [facility])    Past Surgical History:  Procedure Laterality Date   CATARACT EXTRACTION, BILATERAL  2022   EYE SURGERY Bilateral 2021   cataracts   Family History  Problem Relation Age of Onset   Heart disease Mother        MI   Heart attack Mother    Asthma Mother    Hyperlipidemia Mother    Hypertension Mother    Diabetes Father    Hyperlipidemia Sister    Healthy Maternal Grandmother    Healthy Maternal Grandfather    Healthy Paternal Grandmother    Healthy Paternal Grandfather    Colon cancer Neg Hx    Stomach cancer Neg Hx    Esophageal cancer Neg Hx    Social History   Socioeconomic History   Marital status: Married    Spouse name: Not on file   Number of children: 2   Years of education: 26  Highest education level: Not on file  Occupational History   Occupation: Grading Contractor  Tobacco Use   Smoking status: Every Day    Packs/day: .5    Types: Cigarettes   Smokeless tobacco: Never  Vaping Use   Vaping Use: Never used  Substance and Sexual Activity   Alcohol use: No    Alcohol/week: 0.0 standard drinks of alcohol   Drug use: No   Sexual activity: Not Currently  Other Topics Concern   Not on file  Social History Narrative   Fun: Fish, collectable cars/classic cars   Denies religious beliefs effecting health care   Retired from Therapist, occupational   Lives with wife   No dietary restrictions, wears seat belt.    Social Determinants of Health   Financial Resource  Strain: Low Risk  (10/27/2021)   Overall Financial Resource Strain (CARDIA)    Difficulty of Paying Living Expenses: Not hard at all  Food Insecurity: No Food Insecurity (11/02/2022)   Hunger Vital Sign    Worried About Running Out of Food in the Last Year: Never true    Ran Out of Food in the Last Year: Never true  Transportation Needs: No Transportation Needs (11/02/2022)   PRAPARE - Hydrologist (Medical): No    Lack of Transportation (Non-Medical): No  Physical Activity: Inactive (10/27/2021)   Exercise Vital Sign    Days of Exercise per Week: 0 days    Minutes of Exercise per Session: 0 min  Stress: No Stress Concern Present (10/27/2021)   [place]    Feeling of Stress : Not at all  Social Connections: Moderately Isolated (10/27/2021)   Social Connection and Isolation Panel [NHANES]    Frequency of Communication with Friends and Family: More than three times a week    Frequency of Social Gatherings with Friends and Family: More than three times a week    Attends Religious Services: Never    Marine scientist or Organizations: No    Attends Music therapist: Never    Marital Status: Married    Tobacco Counseling Ready to quit: Not Answered Counseling given: Not Answered   Clinical Intake:  Pre-visit preparation completed: Yes  Pain : No/denies pain  Diabetes: No  How often do you need to have someone help you when you read instructions, pamphlets, or other written materials from your doctor or pharmacy?: 1 - Never  Activities of Daily Living    11/02/2022    3:45 PM  In your present state of health, do you have any difficulty performing the following activities:  Hearing? 0  Vision? 0  Comment wears readers  Difficulty concentrating or making decisions? 0  Walking or climbing stairs? 0  Dressing or bathing? 0  Doing errands, shopping? 0  Preparing Food  and eating ? N  Using the Toilet? N  In the past six months, have you accidently leaked urine? N  Do you have problems with loss of bowel control? N  Managing your Medications? N  Managing your Finances? N  Housekeeping or managing your Housekeeping? N    Patient Care Team: Mosie Lukes, MD as PCP - General (Family Medicine)  Indicate any recent Medical Services you may have received from other than [facility] providers in the past year (date may be approximate).     Assessment:   This is a routine wellness examination for [employer].  Hearing/Vision screen No results  found.  Dietary issues and exercise activities discussed: Current Exercise Habits: The patient does not participate in regular exercise at present, Exercise limited by: None identified   Goals Addressed   None    Depression Screen    11/02/2022    3:45 PM 07/21/2022   10:49 AM 05/05/2022    1:41 PM 03/20/2022    9:14 AM 12/18/2021   11:04 AM 10/27/2021    3:23 PM 02/27/2021    4:42 PM  PHQ 2/9 Scores  PHQ - 2 Score 0 0 0 0 0 0 0  PHQ- 9 Score  0  0 0  0    Fall Risk    11/02/2022    3:43 PM 07/21/2022   10:49 AM 05/05/2022    1:41 PM 12/18/2021   11:04 AM 10/27/2021    3:21 PM  [place] in the past year? 0 0 0 0 0  Number falls in past yr: 0 0 0 0 0  Injury with Fall? 0 0 1 0 0  Risk for fall due to : No Fall Risks  Impaired balance/gait No Fall Risks   Follow up Falls evaluation completed  Falls evaluation completed;Falls prevention discussed Falls evaluation completed Falls prevention discussed    FALL RISK PREVENTION PERTAINING TO THE HOME:  Any stairs in or around the home? Yes  If so, are there any without handrails? No  Home free of loose throw rugs in walkways, pet beds, electrical cords, etc? Yes  Adequate lighting in your home to reduce risk of falls? Yes   ASSISTIVE DEVICES UTILIZED TO PREVENT FALLS:  Life alert? No  Use of a cane, walker or w/c? No  Grab bars in the bathroom? Yes   Shower chair or bench in shower? Yes  Elevated toilet seat or a handicapped toilet? No   TIMED UP AND GO:  Was the test performed?  No, audio visit .   Cognitive Function:        11/02/2022    3:50 PM  6CIT Screen  What Year? 0 points  What month? 3 points  What time? 0 points  Count back from 20 0 points  Months in reverse 0 points  Repeat phrase 0 points  Total Score 3 points    Immunizations Immunization History  Administered Date(s) Administered   Hepatitis A, Adult 03/31/2016   Moderna Sars-Covid-2 Vaccination 10/14/2019, 11/23/2019   Pneumococcal Conjugate-13 01/24/2018   Pneumococcal Polysaccharide-23 03/31/2016    TDAP status: Due, Education has been provided regarding the importance of this vaccine. Advised may receive this vaccine at local pharmacy or Health Dept. Aware to provide a copy of the vaccination record if obtained from local pharmacy or Health Dept. Verbalized acceptance and understanding.  Flu Vaccine status: Declined, Education has been provided regarding the importance of this vaccine but patient still declined. Advised may receive this vaccine at local pharmacy or Health Dept. Aware to provide a copy of the vaccination record if obtained from local pharmacy or Health Dept. Verbalized acceptance and understanding.  Pneumococcal vaccine status: Due, Education has been provided regarding the importance of this vaccine. Advised may receive this vaccine at local pharmacy or Health Dept. Aware to provide a copy of the vaccination record if obtained from local pharmacy or Health Dept. Verbalized acceptance and understanding.  Covid-19 vaccine status: Declined, Education has been provided regarding the importance of this vaccine but patient still declined. Advised may receive this vaccine at local pharmacy or Health Dept.or vaccine clinic.  Aware to provide a copy of the vaccination record if obtained from local pharmacy or Health Dept. Verbalized acceptance and  understanding.  Qualifies for Shingles Vaccine? Yes   Zostavax completed No   Shingrix Completed?: No.    Education has been provided regarding the importance of this vaccine. Patient has been advised to call insurance company to determine out of pocket expense if they have not yet received this vaccine. Advised may also receive vaccine at local pharmacy or Health Dept. Verbalized acceptance and understanding.  Screening Tests Health Maintenance  Topic Date Due   DTaP/Tdap/Td (1 - Tdap) Never done   Zoster Vaccines- Shingrix (1 of 2) Never done   Pneumonia Vaccine 58+ Years old (4 of 3 - PPSV23 or PCV20) 03/31/2021   Medicare Annual Wellness (AWV)  10/28/2022   INFLUENZA VACCINE  11/08/2022 (Originally 03/10/2022)   Fecal DNA (Cologuard)  08/30/2024   Hepatitis C Screening  Completed   HPV VACCINES  Aged Out   COLONOSCOPY (Pts 45-95yrs Insurance coverage will need to be confirmed)  Discontinued   COVID-19 Vaccine  Discontinued    Health Maintenance  Health Maintenance Due  Topic Date Due   DTaP/Tdap/Td (1 - Tdap) Never done   Zoster Vaccines- Shingrix (1 of 2) Never done   Pneumonia Vaccine 49+ Years old (5 of 3 - PPSV23 or PCV20) 03/31/2021   Medicare Annual Wellness (AWV)  10/28/2022    Colorectal cancer screening: Type of screening: Cologuard. Completed 08/30/21. Repeat every 3 years  Lung Cancer Screening: (Low Dose CT Chest recommended if Age 66-80 years, 30 pack-year currently smoking OR have quit w/in 15years.) does not qualify.   Additional Screening:  Hepatitis C Screening: does qualify; Completed 05/11/17  Vision Screening: Recommended annual ophthalmology exams for early detection of glaucoma and other disorders of the eye. Is the patient up to date with their annual eye exam?  Yes  Who is the provider or what is the name of the office in which the patient attends annual eye exams? Dr. Renaldo Fiddler If pt is not established with a provider, would they like to be referred to  a provider to establish care? No .   Dental Screening: Recommended annual dental exams for proper oral hygiene  Community Resource Referral / Chronic Care Management: CRR required this visit?  No   CCM required this visit?  No      Plan:     I have personally reviewed and noted the following in the patient's chart:   Medical and social history Use of alcohol, tobacco or illicit drugs  Current medications and supplements including opioid prescriptions. Patient is not currently taking opioid prescriptions. Functional ability and status Nutritional status Physical activity Advanced directives List of other physicians Hospitalizations, surgeries, and ER visits in previous 12 months Vitals Screenings to include cognitive, depression, and falls Referrals and appointments  In addition, I have reviewed and discussed with patient certain preventive protocols, quality metrics, and best practice recommendations. A written personalized care plan for preventive services as well as general preventive health recommendations were provided to patient.   Due to this being a telephonic visit, the after visit summary with patients personalized plan was offered to patient via mail or my-chart. Patient would like to access on my-chart.  Beatris Ship, [place]   11/02/2022   Nurse Notes: None

## 2022-11-23 ENCOUNTER — Encounter: Payer: Self-pay | Admitting: *Deleted

## 2022-12-14 DIAGNOSIS — R051 Acute cough: Secondary | ICD-10-CM | POA: Diagnosis not present

## 2022-12-14 DIAGNOSIS — J209 Acute bronchitis, unspecified: Secondary | ICD-10-CM | POA: Diagnosis not present

## 2022-12-14 DIAGNOSIS — R0981 Nasal congestion: Secondary | ICD-10-CM | POA: Diagnosis not present

## 2022-12-14 DIAGNOSIS — J324 Chronic pansinusitis: Secondary | ICD-10-CM | POA: Diagnosis not present

## 2023-01-05 ENCOUNTER — Other Ambulatory Visit: Payer: Self-pay | Admitting: Family Medicine

## 2023-01-05 ENCOUNTER — Telehealth: Payer: Self-pay | Admitting: Family Medicine

## 2023-01-05 ENCOUNTER — Other Ambulatory Visit: Payer: Self-pay

## 2023-01-05 DIAGNOSIS — I6320 Cerebral infarction due to unspecified occlusion or stenosis of unspecified precerebral arteries: Secondary | ICD-10-CM

## 2023-01-05 DIAGNOSIS — I10 Essential (primary) hypertension: Secondary | ICD-10-CM

## 2023-01-05 NOTE — Telephone Encounter (Signed)
Prescription Request  01/05/2023  Is this a "Controlled Substance" medicine? No  LOV: 07/21/2022  What is the name of the medication or equipment?  lisinopril-hydrochlorothiazide (ZESTORETIC) 20-25 MG tablet   clopidogrel (PLAVIX) 75 MG tablet   Have you contacted your pharmacy to request a refill? Yes   Which pharmacy would you like this sent to?  [id number] [place] - [address] [facility] [place] [address] Phone: [phone] Fax: [phone]    Patient notified that their request is being sent to the clinical staff for review and that they should receive a response within 2 business days.   Please advise at Mobile [phone] (mobile)

## 2023-01-05 NOTE — Telephone Encounter (Signed)
Refills sent

## 2023-01-29 ENCOUNTER — Other Ambulatory Visit: Payer: Self-pay | Admitting: Family Medicine

## 2023-01-29 DIAGNOSIS — I10 Essential (primary) hypertension: Secondary | ICD-10-CM

## 2023-01-31 NOTE — Progress Notes (Deleted)
Subjective:    Patient ID: Ray Williams, male    DOB: 1950/10/11, 72 y.o.   MRN: [id number]  No chief complaint on file.   HPI Discussed the use of AI scribe software for clinical note transcription with the patient, who gave verbal consent to proceed.  History of Present Illness  Patient is a 72 year old male in today for annual preventative exam and follow up on chronic medical concerns. No recent febrile illness or hospitalizations. Denies CP/palp/SOB/HA/congestion/fevers/GI or GU c/o. Taking meds as prescribed           Past Medical History:  Diagnosis Date  . Anemia 01/26/2018  . Anxiety   . Depression   . Depression with anxiety 01/08/2016  . H/O ischemic right MCA stroke   . History of chicken pox 05/11/2017  . Hyperlipidemia   . Hypertension   . Liver disease    hepatitis c  . Preventative health care 08/31/2016  . Right shoulder pain 05/11/2017  . Stroke [facility])     Past Surgical History:  Procedure Laterality Date  . CATARACT EXTRACTION, BILATERAL  2022  . EYE SURGERY Bilateral 2021   cataracts    Family History  Problem Relation Age of Onset  . Heart disease Mother        MI  . Heart attack Mother   . Asthma Mother   . Hyperlipidemia Mother   . Hypertension Mother   . Diabetes Father   . Hyperlipidemia Sister   . Healthy Maternal Grandmother   . Healthy Maternal Grandfather   . Healthy Paternal Grandmother   . Healthy Paternal Grandfather   . Colon cancer Neg Hx   . Stomach cancer Neg Hx   . Esophageal cancer Neg Hx     Social History   Socioeconomic History  . Marital status: Married    Spouse name: Not on file  . Number of children: 2  . Years of education: 23  . Highest education level: Not on file  Occupational History  . Occupation: Grading Contractor  Tobacco Use  . Smoking status: Every Day    Packs/day: .5    Types: Cigarettes  . Smokeless tobacco: Never  Vaping Use  . Vaping Use: Never used  Substance and Sexual  Activity  . Alcohol use: No    Alcohol/week: 0.0 standard drinks of alcohol  . Drug use: No  . Sexual activity: Not Currently  Other Topics Concern  . Not on file  Social History Narrative   Fun: Fish, collectable cars/classic cars   Denies religious beliefs effecting health care   Retired from Retail banker   Lives with wife   No dietary restrictions, wears seat belt.    Social Determinants of Health   Financial Resource Strain: Low Risk  (10/27/2021)   Overall Financial Resource Strain (CARDIA)   . Difficulty of Paying Living Expenses: Not hard at all  Food Insecurity: No Food Insecurity (11/02/2022)   Hunger Vital Sign   . Worried About Programme researcher, broadcasting/film/video in the Last Year: Never true   . Ran Out of Food in the Last Year: Never true  Transportation Needs: No Transportation Needs (11/02/2022)   PRAPARE - Transportation   . Lack of Transportation (Medical): No   . Lack of Transportation (Non-Medical): No  Physical Activity: Inactive (10/27/2021)   Exercise Vital Sign   . Days of Exercise per Week: 0 days   . Minutes of Exercise per Session: 0 min  Stress: No Stress Concern  Present (10/27/2021)   [employer] of Occupational Health - Occupational Stress Questionnaire   . Feeling of Stress : Not at all  Social Connections: Moderately Isolated (10/27/2021)   Social Connection and Isolation Panel [NHANES]   . Frequency of Communication with Friends and Family: More than three times a week   . Frequency of Social Gatherings with Friends and Family: More than three times a week   . Attends Religious Services: Never   . Active Member of Clubs or Organizations: No   . Attends Banker Meetings: Never   . Marital Status: Married  Catering manager Violence: Not At Risk (11/02/2022)   Humiliation, Afraid, Rape, and Kick questionnaire   . Fear of Current or Ex-Partner: No   . Emotionally Abused: No   . Physically Abused: No   . Sexually Abused: No     Outpatient Medications Prior to Visit  Medication Sig Dispense Refill  . atorvastatin (LIPITOR) 80 MG tablet Take 1 tablet (80 mg total) by mouth daily. 90 tablet 1  . citalopram (CELEXA) 20 MG tablet Take 1 tablet (20 mg total) by mouth daily. 90 tablet 1  . clopidogrel (PLAVIX) 75 MG tablet Take 1 tablet (75 mg total) by mouth daily. Needs appt 90 tablet 0  . cyanocobalamin (VITAMIN B12) 1000 MCG/ML injection Inject 1,000 mcg into the muscle once.    Marland Kitchen lisinopril-hydrochlorothiazide (ZESTORETIC) 20-25 MG tablet Take 1 tablet by mouth daily. 90 tablet 0  . pantoprazole (PROTONIX) 40 MG tablet Take 1 tablet (40 mg total) by mouth daily. Take 1 tablet 30 minutes before first meal of day 90 tablet 3   No facility-administered medications prior to visit.    No Known Allergies  Review of Systems  Constitutional:  Negative for chills, fever and malaise/fatigue.  HENT:  Negative for congestion and hearing loss.   Eyes:  Negative for discharge.  Respiratory:  Negative for cough, sputum production and shortness of breath.   Cardiovascular:  Negative for chest pain, palpitations and leg swelling.  Gastrointestinal:  Negative for abdominal pain, blood in stool, constipation, diarrhea, heartburn, nausea and vomiting.  Genitourinary:  Negative for dysuria, frequency, hematuria and urgency.  Musculoskeletal:  Negative for back pain, falls and myalgias.  Skin:  Negative for rash.  Neurological:  Negative for dizziness, sensory change, loss of consciousness, weakness and headaches.  Endo/Heme/Allergies:  Negative for environmental allergies. Does not bruise/bleed easily.  Psychiatric/Behavioral:  Negative for depression and suicidal ideas. The patient is not nervous/anxious and does not have insomnia.       Objective:    Physical Exam Vitals reviewed.  Constitutional:      General: He is not in acute distress.    Appearance: Normal appearance. He is not ill-appearing or diaphoretic.  HENT:      Head: Normocephalic and atraumatic.     Right Ear: Tympanic membrane, ear canal and external ear normal. There is no impacted cerumen.     Left Ear: Tympanic membrane, ear canal and external ear normal. There is no impacted cerumen.     Nose: Nose normal. No rhinorrhea.     Mouth/Throat:     Pharynx: Oropharynx is clear.  Eyes:     General: No scleral icterus.    Extraocular Movements: Extraocular movements intact.     Conjunctiva/sclera: Conjunctivae normal.     Pupils: Pupils are equal, round, and reactive to light.  Neck:     Thyroid: No thyroid mass or thyroid tenderness.  Cardiovascular:  Rate and Rhythm: Normal rate and regular rhythm.     Pulses: Normal pulses.     Heart sounds: Normal heart sounds. No murmur heard. Pulmonary:     Effort: Pulmonary effort is normal.     Breath sounds: Normal breath sounds. No wheezing.  Abdominal:     General: Bowel sounds are normal.     Palpations: Abdomen is soft. There is no mass.     Tenderness: There is no guarding.  Musculoskeletal:        General: No swelling. Normal range of motion.     Cervical back: Normal range of motion and neck supple. No rigidity.     Right lower leg: No edema.     Left lower leg: No edema.  Lymphadenopathy:     Cervical: No cervical adenopathy.  Skin:    General: Skin is warm and dry.     Findings: No rash.  Neurological:     General: No focal deficit present.     Mental Status: He is alert and oriented to person, place, and time.     Cranial Nerves: No cranial nerve deficit.     Deep Tendon Reflexes: Reflexes normal.  Psychiatric:        Mood and Affect: Mood normal.        Behavior: Behavior normal.   There were no vitals taken for this visit. Wt Readings from Last 3 Encounters:  07/21/22 156 lb 9.6 oz (71 kg)  07/11/22 155 lb (70.3 kg)  06/23/22 155 lb (70.3 kg)    Diabetic Foot Exam - Simple   No data filed    Lab Results  Component Value Date   WBC 6.6 07/21/2022   HGB 12.8  (L) 07/21/2022   HCT 38.5 (L) 07/21/2022   PLT 295.0 07/21/2022   GLUCOSE 89 08/21/2022   CHOL 138 07/21/2022   TRIG 127.0 07/21/2022   HDL 32.10 (L) 07/21/2022   LDLDIRECT 60.0 01/24/2018   LDLCALC 80 07/21/2022   ALT 22 08/21/2022   AST 7 08/21/2022   NA 138 08/21/2022   K 5.2 No hemolysis seen (H) 08/21/2022   CL 104 08/21/2022   CREATININE 1.44 08/21/2022   BUN 30 (H) 08/21/2022   CO2 25 08/21/2022   TSH 0.96 07/21/2022   PSA 1.31 02/27/2021   INR 0.96 01/08/2016   HGBA1C 6.2 07/21/2022    Lab Results  Component Value Date   TSH 0.96 07/21/2022   Lab Results  Component Value Date   WBC 6.6 07/21/2022   HGB 12.8 (L) 07/21/2022   HCT 38.5 (L) 07/21/2022   MCV 89.3 07/21/2022   PLT 295.0 07/21/2022   Lab Results  Component Value Date   NA 138 08/21/2022   K 5.2 No hemolysis seen (H) 08/21/2022   CO2 25 08/21/2022   GLUCOSE 89 08/21/2022   BUN 30 (H) 08/21/2022   CREATININE 1.44 08/21/2022   BILITOT 0.4 08/21/2022   ALKPHOS 94 08/21/2022   AST 7 08/21/2022   ALT 22 08/21/2022   PROT 7.2 08/21/2022   ALBUMIN 4.2 08/21/2022   CALCIUM 9.6 08/21/2022   ANIONGAP 9 05/02/2022   GFR 49.27 (L) 08/21/2022   Lab Results  Component Value Date   CHOL 138 07/21/2022   Lab Results  Component Value Date   HDL 32.10 (L) 07/21/2022   Lab Results  Component Value Date   LDLCALC 80 07/21/2022   Lab Results  Component Value Date   TRIG 127.0 07/21/2022   Lab Results  Component Value Date   CHOLHDL 4 07/21/2022   Lab Results  Component Value Date   HGBA1C 6.2 07/21/2022       Assessment & Plan:  Essential hypertension Assessment & Plan: Well controlled, no changes to meds. Encouraged heart healthy diet such as the DASH diet and exercise as tolerated.     Tobacco abuse Assessment & Plan: Encouraged complete cessation, he declines assistance and is not ready to quit at this time. Also declines vaccinations   Mixed hyperlipidemia Assessment &  Plan: Encourage heart healthy diet such as MIND or DASH diet, increase exercise, avoid trans fats, simple carbohydrates and processed foods, consider a krill or fish or flaxseed oil cap daily.  Tolerating Atorvastatin   Hyperglycemia Assessment & Plan: hgba1c acceptable, minimize simple carbs. Increase exercise as tolerated.    B12 deficiency Assessment & Plan: Supplement and monitor    Preventative health care Assessment & Plan: Patient encouraged to maintain heart healthy diet, regular exercise, adequate sleep. Consider daily probiotics. Take medications as prescribed. Cologuard 08/2021, repeat in 2026     Assessment and Plan              Danise Edge, MD

## 2023-01-31 NOTE — Assessment & Plan Note (Deleted)
hgba1c acceptable, minimize simple carbs. Increase exercise as tolerated.  

## 2023-01-31 NOTE — Assessment & Plan Note (Deleted)
Encourage heart healthy diet such as MIND or DASH diet, increase exercise, avoid trans fats, simple carbohydrates and processed foods, consider a krill or fish or flaxseed oil cap daily. Tolerating Atorvastatin 

## 2023-01-31 NOTE — Assessment & Plan Note (Deleted)
Patient encouraged to maintain heart healthy diet, regular exercise, adequate sleep. Consider daily probiotics. Take medications as prescribed. Cologuard 08/2021, repeat in 2026

## 2023-01-31 NOTE — Assessment & Plan Note (Deleted)
Encouraged complete cessation, he declines assistance and is not ready to quit at this time. Also declines vaccinations 

## 2023-01-31 NOTE — Assessment & Plan Note (Deleted)
Well controlled, no changes to meds. Encouraged heart healthy diet such as the DASH diet and exercise as tolerated.  °

## 2023-01-31 NOTE — Assessment & Plan Note (Deleted)
Supplement and monitor 

## 2023-02-01 ENCOUNTER — Encounter: Payer: Medicare HMO | Admitting: Family Medicine

## 2023-02-01 DIAGNOSIS — Z720 Tobacco use: Secondary | ICD-10-CM

## 2023-02-01 DIAGNOSIS — E538 Deficiency of other specified B group vitamins: Secondary | ICD-10-CM

## 2023-02-01 DIAGNOSIS — I10 Essential (primary) hypertension: Secondary | ICD-10-CM

## 2023-02-01 DIAGNOSIS — R739 Hyperglycemia, unspecified: Secondary | ICD-10-CM

## 2023-02-01 DIAGNOSIS — Z0000 Encounter for general adult medical examination without abnormal findings: Secondary | ICD-10-CM

## 2023-02-01 DIAGNOSIS — E782 Mixed hyperlipidemia: Secondary | ICD-10-CM

## 2023-04-08 ENCOUNTER — Telehealth: Payer: Self-pay | Admitting: Family Medicine

## 2023-04-08 ENCOUNTER — Other Ambulatory Visit: Payer: Self-pay | Admitting: Family Medicine

## 2023-04-08 DIAGNOSIS — I10 Essential (primary) hypertension: Secondary | ICD-10-CM

## 2023-04-08 DIAGNOSIS — I6320 Cerebral infarction due to unspecified occlusion or stenosis of unspecified precerebral arteries: Secondary | ICD-10-CM

## 2023-04-08 NOTE — Telephone Encounter (Signed)
Medication sent.

## 2023-04-08 NOTE — Telephone Encounter (Signed)
Prescription Request  04/08/2023  Is this a "Controlled Substance" medicine? No  LOV: Visit date not found  What is the name of the medication or equipment?   clopidogrel (PLAVIX) 75 MG tablet [[id number]]  citalopram (CELEXA) 20 MG tablet [[id number]]  lisinopril-hydrochlorothiazide (ZESTORETIC) 20-25 MG tablet [[id number]]  Have you contacted your pharmacy to request a refill? Yes   Which pharmacy would you like this sent to?  [facility] [facility], [place] - [address] [facility] [place] [address] Phone: [phone] Fax: [phone]    Patient notified that their request is being sent to the clinical staff for review and that they should receive a response within 2 business days.   Please advise at Mobile [phone] (mobile)

## 2023-07-15 ENCOUNTER — Other Ambulatory Visit: Payer: Self-pay | Admitting: Emergency Medicine

## 2023-07-15 ENCOUNTER — Telehealth: Payer: Self-pay | Admitting: Family Medicine

## 2023-07-15 ENCOUNTER — Other Ambulatory Visit: Payer: Self-pay | Admitting: Family Medicine

## 2023-07-15 DIAGNOSIS — I6320 Cerebral infarction due to unspecified occlusion or stenosis of unspecified precerebral arteries: Secondary | ICD-10-CM

## 2023-07-15 DIAGNOSIS — I10 Essential (primary) hypertension: Secondary | ICD-10-CM

## 2023-07-15 MED ORDER — CITALOPRAM HYDROBROMIDE 20 MG PO TABS: 20.0000 mg | ORAL_TABLET | Freq: Every day | ORAL | 0 refills | Status: DC

## 2023-07-15 MED ORDER — LISINOPRIL-HYDROCHLOROTHIAZIDE 20-25 MG PO TABS: 1.0000 | ORAL_TABLET | Freq: Every day | ORAL | 0 refills | Status: DC

## 2023-07-15 NOTE — Telephone Encounter (Signed)
Called patient. Spoke with wife Carlisle Beers) Patient has an appointment with Hyman Hopes 07/20/2023

## 2023-07-15 NOTE — Telephone Encounter (Signed)
Prescription Request  07/15/2023  Is this a "Controlled Substance" medicine? No  LOV: Visit date not found  What is the name of the medication or equipment?  citalopram (CELEXA) 20 MG tablet  clopidogrel (PLAVIX) 75 MG tablet  lisinopril-hydrochlorothiazide (ZESTORETIC) 20-25 MG tablet   Have you contacted your pharmacy to request a refill? Yes   Which pharmacy would you like this sent to?  [facility], [place] - [address] [facility] [place] [address] Phone: [phone] Fax: [phone]    Patient notified that their request is being sent to the clinical staff for review and that they should receive a response within 2 business days.   Please advise at [facility] [phone]

## 2023-07-20 ENCOUNTER — Ambulatory Visit: Payer: Medicare HMO | Admitting: Family Medicine

## 2023-07-20 ENCOUNTER — Encounter: Payer: Self-pay | Admitting: Family Medicine

## 2023-07-20 VITALS — BP 124/71 | HR 86 | Ht 66.0 in | Wt 164.0 lb

## 2023-07-20 DIAGNOSIS — E538 Deficiency of other specified B group vitamins: Secondary | ICD-10-CM

## 2023-07-20 DIAGNOSIS — R739 Hyperglycemia, unspecified: Secondary | ICD-10-CM

## 2023-07-20 DIAGNOSIS — D649 Anemia, unspecified: Secondary | ICD-10-CM

## 2023-07-20 DIAGNOSIS — I6320 Cerebral infarction due to unspecified occlusion or stenosis of unspecified precerebral arteries: Secondary | ICD-10-CM | POA: Diagnosis not present

## 2023-07-20 DIAGNOSIS — K2970 Gastritis, unspecified, without bleeding: Secondary | ICD-10-CM | POA: Diagnosis not present

## 2023-07-20 DIAGNOSIS — Z720 Tobacco use: Secondary | ICD-10-CM | POA: Diagnosis not present

## 2023-07-20 DIAGNOSIS — E782 Mixed hyperlipidemia: Secondary | ICD-10-CM

## 2023-07-20 DIAGNOSIS — Z0000 Encounter for general adult medical examination without abnormal findings: Secondary | ICD-10-CM

## 2023-07-20 DIAGNOSIS — I10 Essential (primary) hypertension: Secondary | ICD-10-CM

## 2023-07-20 DIAGNOSIS — F418 Other specified anxiety disorders: Secondary | ICD-10-CM

## 2023-07-20 DIAGNOSIS — Z125 Encounter for screening for malignant neoplasm of prostate: Secondary | ICD-10-CM

## 2023-07-20 LAB — COMPREHENSIVE METABOLIC PANEL
ALT: 23 U/L (ref 0–53)
AST: 13 U/L (ref 0–37)
Albumin: 4.5 g/dL (ref 3.5–5.2)
Alkaline Phosphatase: 84 U/L (ref 39–117)
BUN: 28 mg/dL — ABNORMAL HIGH (ref 6–23)
CO2: 27 meq/L (ref 19–32)
Calcium: 9.5 mg/dL (ref 8.4–10.5)
Chloride: 101 meq/L (ref 96–112)
Creatinine, Ser: 1.42 mg/dL (ref 0.40–1.50)
GFR: 49.78 mL/min — ABNORMAL LOW (ref >60.00)
Glucose, Bld: 99 mg/dL (ref 70–99)
Potassium: 5.1 meq/L (ref 3.5–5.1)
Sodium: 139 meq/L (ref 135–145)
Total Bilirubin: 0.5 mg/dL (ref 0.2–1.2)
Total Protein: 7.3 g/dL (ref 6.0–8.3)

## 2023-07-20 LAB — LIPID PANEL
Cholesterol: 140 mg/dL (ref 0–200)
HDL: 36.3 mg/dL — ABNORMAL LOW (ref >39.00)
LDL Cholesterol: 79 mg/dL (ref 0–99)
NonHDL: 103.86
Total CHOL/HDL Ratio: 4
Triglycerides: 124 mg/dL (ref 0.0–149.0)
VLDL: 24.8 mg/dL (ref 0.0–40.0)

## 2023-07-20 LAB — CBC WITH DIFFERENTIAL/PLATELET
Basophils Absolute: 0 10*3/uL (ref 0.0–0.1)
Basophils Relative: 0.7 % (ref 0.0–3.0)
Eosinophils Absolute: 0.3 10*3/uL (ref 0.0–0.7)
Eosinophils Relative: 4.7 % (ref 0.0–5.0)
HCT: 38.9 % — ABNORMAL LOW (ref 39.0–52.0)
Hemoglobin: 13 g/dL (ref 13.0–17.0)
Lymphocytes Relative: 17.1 % (ref 12.0–46.0)
Lymphs Abs: 1.1 10*3/uL (ref 0.7–4.0)
MCHC: 33.5 g/dL (ref 30.0–36.0)
MCV: 90.6 fL (ref 78.0–100.0)
Monocytes Absolute: 0.5 10*3/uL (ref 0.1–1.0)
Monocytes Relative: 8.1 % (ref 3.0–12.0)
Neutro Abs: 4.3 10*3/uL (ref 1.4–7.7)
Neutrophils Relative %: 69.4 % (ref 43.0–77.0)
Platelets: 310 10*3/uL (ref 150.0–400.0)
RBC: 4.3 Mil/uL (ref 4.22–5.81)
RDW: 13.9 % (ref 11.5–15.5)
WBC: 6.2 10*3/uL (ref 4.0–10.5)

## 2023-07-20 LAB — HEMOGLOBIN A1C: Hgb A1c MFr Bld: 5.9 % (ref 4.6–6.5)

## 2023-07-20 LAB — PSA: PSA: 0.99 ng/mL (ref 0.10–4.00)

## 2023-07-20 LAB — VITAMIN B12: Vitamin B-12: 341 pg/mL (ref 211–911)

## 2023-07-20 LAB — TSH: TSH: 1.17 u[IU]/mL (ref 0.35–5.50)

## 2023-07-20 MED ORDER — CITALOPRAM HYDROBROMIDE 20 MG PO TABS: 20.0000 mg | ORAL_TABLET | Freq: Every day | ORAL | 1 refills | Status: DC

## 2023-07-20 MED ORDER — ATORVASTATIN CALCIUM 80 MG PO TABS
80.0000 mg | ORAL_TABLET | Freq: Every day | ORAL | 1 refills | Status: DC
Start: 2023-07-20 — End: 2024-01-13

## 2023-07-20 MED ORDER — LISINOPRIL-HYDROCHLOROTHIAZIDE 20-25 MG PO TABS: 1.0000 | ORAL_TABLET | Freq: Every day | ORAL | 1 refills | Status: DC

## 2023-07-20 MED ORDER — CLOPIDOGREL BISULFATE 75 MG PO TABS: 75.0000 mg | ORAL_TABLET | Freq: Every day | ORAL | 1 refills | Status: DC

## 2023-07-20 MED ORDER — PANTOPRAZOLE SODIUM 40 MG PO TBEC: 40.0000 mg | DELAYED_RELEASE_TABLET | Freq: Every day | ORAL | 1 refills | Status: DC

## 2023-07-20 NOTE — Assessment & Plan Note (Signed)
Lifestyle factors for lowering cholesterol include: Diet therapy - heart-healthy diet rich in fruits, veggies, fiber-rich whole grains, lean meats, chicken, fish (at least twice a week), fat-free or 1% dairy products; foods low in saturated/trans fats, cholesterol, sodium, and sugar. Mediterranean diet has shown to be very heart healthy. Regular exercise - recommend at least 30 minutes a day, 5 times per week Weight management  Continue lipitor. Labs today

## 2023-07-20 NOTE — Progress Notes (Signed)
Established Patient Office Visit  Subjective   Patient ID: Ray Williams, male    DOB: 1950/08/24  Age: 72 y.o. MRN: [id number]  Chief Complaint  Patient presents with   Medical Management of Chronic Issues    HPI   Discussed the use of AI scribe software for clinical note transcription with the patient, who gave verbal consent to proceed.  History of Present Illness   The patient, with a history of hypertension, hyperlipidemia, and mood disorder, has been managing well on his current medication regimen. This includes Lipitor 80mg , Plavix 75mg , Lisinopril-Hydrochlorothiazide, and Celexa 20mg . He has not required Protonix for reflux for a significant period. He completed a course of B12 injections last year.  The patient remains active, primarily through caring for his young grandchildren who live nearby. He reports no issues with anxiety or depression, and his mood has been stable. He has been adhering to a general diet and maintaining an active lifestyle.  The patient continues to smoke, albeit less than half a pack a day, and has attempted cessation with various methods, including gums, patches, and Chantix, but found the side effects of Chantix intolerable. He is trying to wean down on his own.          Current Outpatient Medications  Medication Instructions   atorvastatin (LIPITOR) 80 mg, Oral, Daily   citalopram (CELEXA) 20 mg, Oral, Daily   clopidogrel (PLAVIX) 75 mg, Oral, Daily   lisinopril-hydrochlorothiazide (ZESTORETIC) 20-25 MG tablet 1 tablet, Oral, Daily   pantoprazole (PROTONIX) 40 mg, Oral, Daily, Take 1 tablet 30 minutes before first meal of day   Lab Results  Component Value Date   HGBA1C 6.2 07/21/2022          ROS All review of systems negative except what is listed in the HPI    Objective:     BP 124/71   Pulse 86   Ht 5\' 6"  (1.676 m)   Wt 164 lb (74.4 kg)   SpO2 100%   BMI 26.47 kg/m    Physical Exam Vitals reviewed.   Constitutional:      Appearance: Normal appearance.  Cardiovascular:     Rate and Rhythm: Normal rate and regular rhythm.     Heart sounds: Normal heart sounds.  Pulmonary:     Effort: Pulmonary effort is normal.     Breath sounds: Normal breath sounds.  Skin:    General: Skin is warm and dry.  Neurological:     Mental Status: He is alert and oriented to person, place, and time.  Psychiatric:        Mood and Affect: Mood normal.        Behavior: Behavior normal.        Thought Content: Thought content normal.        Judgment: Judgment normal.         No results found for any visits on 07/20/23.    The ASCVD Risk score (Arnett DK, et al., 2019) failed to calculate for the following reasons:   The patient has a prior MI or stroke diagnosis    Assessment & Plan:   Problem List Items Addressed This Visit       Active Problems   Essential hypertension    Well controlled with Lisinopril-Hydrochlorothiazide. -Continue current medication regimen and heart healthy lifestyle      Relevant Medications   atorvastatin (LIPITOR) 80 MG tablet   lisinopril-hydrochlorothiazide (ZESTORETIC) 20-25 MG tablet   Other Relevant Orders   Comprehensive metabolic  panel   Tobacco abuse    Smoking approximately 1/3 to 1/2 pack per day, has tried cessation aids in the past with limited success. -Provide patient with additional information about smoking cessation resources.       HLD (hyperlipidemia)    Lifestyle factors for lowering cholesterol include: Diet therapy - heart-healthy diet rich in fruits, veggies, fiber-rich whole grains, lean meats, chicken, fish (at least twice a week), fat-free or 1% dairy products; foods low in saturated/trans fats, cholesterol, sodium, and sugar. Mediterranean diet has shown to be very heart healthy. Regular exercise - recommend at least 30 minutes a day, 5 times per week Weight management  Continue lipitor. Labs today      Relevant Medications    atorvastatin (LIPITOR) 80 MG tablet   lisinopril-hydrochlorothiazide (ZESTORETIC) 20-25 MG tablet   Other Relevant Orders   Lipid panel   Depression with anxiety    Stable on Celexa 20mg  daily. -Continue current medication regimen.      Relevant Medications   citalopram (CELEXA) 20 MG tablet   Preventative health care - Primary   Relevant Orders   CBC with Differential/Platelet   Comprehensive metabolic panel   Lipid panel   PSA   TSH   Vitamin B12   Hyperglycemia    Labs today      Relevant Orders   Hemoglobin A1c   Anemia    Labs today. Asymptomatic       Relevant Orders   CBC with Differential/Platelet   B12 deficiency    Completed B12 shots last year. -Check B12 levels today.      Relevant Orders   Vitamin B12   Other Visit Diagnoses     Prostate cancer screening       Relevant Orders   PSA   Cerebrovascular accident (CVA) due to occlusion of precerebral artery (HCC)     No new symptoms. Continue current meds and preventative measures.    Relevant Medications   atorvastatin (LIPITOR) 80 MG tablet   clopidogrel (PLAVIX) 75 MG tablet   lisinopril-hydrochlorothiazide (ZESTORETIC) 20-25 MG tablet   Gastritis, presence of bleeding unspecified, unspecified chronicity, unspecified gastritis type    Stable. Using PRN protonix.     Relevant Medications   pantoprazole (PROTONIX) 40 MG tablet       Return in about 6 months (around 01/18/2024) for routine follow-up.    Clayborne Dana, NP

## 2023-07-20 NOTE — Assessment & Plan Note (Signed)
Stable on Celexa 20mg  daily. -Continue current medication regimen.

## 2023-07-20 NOTE — Assessment & Plan Note (Signed)
Labs today

## 2023-07-20 NOTE — Assessment & Plan Note (Signed)
Well controlled with Lisinopril-Hydrochlorothiazide. -Continue current medication regimen and heart healthy lifestyle

## 2023-07-20 NOTE — Assessment & Plan Note (Signed)
Labs today. Asymptomatic.

## 2023-07-20 NOTE — Assessment & Plan Note (Signed)
Smoking approximately 1/3 to 1/2 pack per day, has tried cessation aids in the past with limited success. -Provide patient with additional information about smoking cessation resources.

## 2023-07-20 NOTE — Assessment & Plan Note (Signed)
Completed B12 shots last year. -Check B12 levels today.

## 2023-07-26 ENCOUNTER — Encounter: Payer: Self-pay | Admitting: Neurology

## 2023-09-27 ENCOUNTER — Telehealth: Payer: Self-pay | Admitting: Family Medicine

## 2023-09-27 NOTE — Telephone Encounter (Signed)
Copied from CRM [id number]. Topic: Medicare AWV >> Sep 27, 2023  1:25 PM Payton Doughty wrote: Reason for CRM: Called LVM 09/27/2023 to schedule AWV. Please schedule Virtual or Telehealth visits ONLY.   Verlee Rossetti; Care Guide Ambulatory Clinical Support [place] [facility] Direct Dial: [phone]

## 2024-01-13 ENCOUNTER — Other Ambulatory Visit: Payer: Self-pay | Admitting: Family Medicine

## 2024-01-13 DIAGNOSIS — I6320 Cerebral infarction due to unspecified occlusion or stenosis of unspecified precerebral arteries: Secondary | ICD-10-CM

## 2024-01-13 DIAGNOSIS — I10 Essential (primary) hypertension: Secondary | ICD-10-CM

## 2024-01-13 DIAGNOSIS — F418 Other specified anxiety disorders: Secondary | ICD-10-CM

## 2024-01-13 DIAGNOSIS — E782 Mixed hyperlipidemia: Secondary | ICD-10-CM

## 2024-01-13 MED ORDER — CLOPIDOGREL BISULFATE 75 MG PO TABS: 75.0000 mg | ORAL_TABLET | Freq: Every day | ORAL | 0 refills | Status: DC

## 2024-01-13 MED ORDER — LISINOPRIL-HYDROCHLOROTHIAZIDE 20-25 MG PO TABS: 1.0000 | ORAL_TABLET | Freq: Every day | ORAL | 0 refills | Status: DC

## 2024-01-13 MED ORDER — CITALOPRAM HYDROBROMIDE 20 MG PO TABS: 20.0000 mg | ORAL_TABLET | Freq: Every day | ORAL | 0 refills | Status: DC

## 2024-01-13 MED ORDER — ATORVASTATIN CALCIUM 80 MG PO TABS: 80.0000 mg | ORAL_TABLET | Freq: Every day | ORAL | 0 refills | Status: DC

## 2024-01-13 NOTE — Telephone Encounter (Signed)
 Last Fill: Plavix : 07/20/23     Celexa : 07/20/23     Zestoretic : 07/20/23     Atorvastatin : 07/20/23  Last OV: 07/20/23 Next OV: 01/20/24  Routing to provider for review/authorization.

## 2024-01-13 NOTE — Telephone Encounter (Signed)
 Copied from CRM [id number]. Topic: Clinical - Medication Refill >> Jan 13, 2024  8:45 AM Howard Macho wrote: Medication: clopidogrel  (PLAVIX ) 75 MG tablet. Lipitor, lisinopril -hydrochlorothiazide  (ZESTORETIC ) 20-25 MG tablet, citalopram  (CELEXA ) 20 MG tablet  Has the patient contacted their pharmacy? Yes (Agent: If no, request that the patient contact the pharmacy for the refill. If patient does not wish to contact the pharmacy document the reason why and proceed with request.) (Agent: If yes, when and what did the pharmacy advise?)  This is the patient's preferred pharmacy:  [facility] [address] [place] - [address] [facility] [place] [address] Phone: [phone] Fax: [phone]  Is this the correct pharmacy for this prescription? Yes If no, delete pharmacy and type the correct one.   Has the prescription been filled recently? No  Is the patient out of the medication? Yes  Has the patient been seen for an appointment in the last year OR does the patient have an upcoming appointment? Yes  Can we respond through MyChart? Yes  Agent: Please be advised that Rx refills may take up to 3 business days. We ask that you follow-up with your pharmacy.

## 2024-01-19 NOTE — Assessment & Plan Note (Signed)
 Encourage heart healthy diet such as MIND or DASH diet, increase exercise, avoid trans fats, simple carbohydrates and processed foods, consider a krill or fish or flaxseed oil cap daily. Tolerating Atorvastatin

## 2024-01-19 NOTE — Assessment & Plan Note (Signed)
 Well controlled, no changes to meds. Encouraged heart healthy diet such as the DASH diet and exercise as tolerated.

## 2024-01-19 NOTE — Assessment & Plan Note (Signed)
 hgba1c acceptable, minimize simple carbs. Increase exercise as tolerated.

## 2024-01-19 NOTE — Assessment & Plan Note (Signed)
 Supplement and monitor

## 2024-01-20 ENCOUNTER — Ambulatory Visit: Payer: Medicare HMO | Admitting: Family Medicine

## 2024-01-20 ENCOUNTER — Encounter: Payer: Self-pay | Admitting: Family Medicine

## 2024-01-20 VITALS — BP 122/68 | HR 68 | Resp 16 | Ht 66.0 in | Wt 164.4 lb

## 2024-01-20 DIAGNOSIS — Z720 Tobacco use: Secondary | ICD-10-CM

## 2024-01-20 DIAGNOSIS — Z8673 Personal history of transient ischemic attack (TIA), and cerebral infarction without residual deficits: Secondary | ICD-10-CM | POA: Diagnosis not present

## 2024-01-20 DIAGNOSIS — R739 Hyperglycemia, unspecified: Secondary | ICD-10-CM | POA: Diagnosis not present

## 2024-01-20 DIAGNOSIS — I6320 Cerebral infarction due to unspecified occlusion or stenosis of unspecified precerebral arteries: Secondary | ICD-10-CM

## 2024-01-20 DIAGNOSIS — I10 Essential (primary) hypertension: Secondary | ICD-10-CM

## 2024-01-20 DIAGNOSIS — F418 Other specified anxiety disorders: Secondary | ICD-10-CM

## 2024-01-20 DIAGNOSIS — E782 Mixed hyperlipidemia: Secondary | ICD-10-CM | POA: Diagnosis not present

## 2024-01-20 DIAGNOSIS — E538 Deficiency of other specified B group vitamins: Secondary | ICD-10-CM | POA: Diagnosis not present

## 2024-01-20 MED ORDER — CLOPIDOGREL BISULFATE 75 MG PO TABS: 75.0000 mg | ORAL_TABLET | Freq: Every day | ORAL | 1 refills | Status: DC

## 2024-01-20 MED ORDER — LISINOPRIL-HYDROCHLOROTHIAZIDE 20-25 MG PO TABS: 1.0000 | ORAL_TABLET | Freq: Every day | ORAL | 1 refills | Status: DC

## 2024-01-20 MED ORDER — ATORVASTATIN CALCIUM 80 MG PO TABS
80.0000 mg | ORAL_TABLET | Freq: Every day | ORAL | 1 refills | Status: DC
Start: 2024-01-20 — End: 2024-05-11

## 2024-01-20 MED ORDER — CITALOPRAM HYDROBROMIDE 20 MG PO TABS
20.0000 mg | ORAL_TABLET | Freq: Every day | ORAL | 1 refills | Status: DC
Start: 2024-01-20 — End: 2024-04-11

## 2024-01-20 NOTE — Assessment & Plan Note (Addendum)
 He is interested in trying to cut back for his grandchildren but does not currently feel ready to try medications or counseling. He will let us  know if he is ready and we discussed strategies to cut down.

## 2024-01-20 NOTE — Patient Instructions (Signed)
 Patches start at 14 and can use gum or lozenges for breakthroughNicotine Gum What is this medication? NICOTINE  (NIK [place] teen) helps you quit smoking. It reduces cravings for nicotine , the addictive substance found in tobacco. It may also help reduce symptoms of withdrawal. It is most effective when used in combination with a stop-smoking program. This medicine may be used for other purposes; ask your health care provider or pharmacist if you have questions. COMMON BRAND NAME(S): NICOrelief, Nicorette What should I tell my care team before I take this medication? They need to know if you have any of these conditions: Diabetes Heart disease, angina, irregular heartbeat, or previous heart attack High blood pressure High thyroid  levels Lung or breathing disease, such as asthma Pheochromocytoma Seizures or history of seizures Stomach ulcers, other stomach or intestine problems An unusual or allergic reaction to nicotine , other medications, foods, dyes, or preservatives Pregnant or trying to get pregnant Breastfeeding How should I use this medication? Chew this gum in the mouth. Do not swallow the gum. Follow the directions on the product label. Use exactly as directed. When you feel an urgent desire for a cigarette, chew one piece of gum slowly. Chew until you feel a slight tingling in your mouth. Then, stop chewing and place the gum between your cheek and gum. Wait until the taste or tingling is almost gone, then start chewing again. Continue chewing in this manner for about 30 minutes. Slow chewing helps reduce cravings. Do not use more often than directed. This medication comes with INSTRUCTIONS FOR USE. Ask your pharmacist for directions on how to use this medication. Read the information carefully. Talk to your pharmacist or care team if you have questions. Talk to your care team about the use of this medication in children. Special care may be needed. Overdosage: If you think you have taken too  much of this medicine contact a poison control center or emergency room at once. NOTE: This medicine is only for you. Do not share this medicine with others. What if I miss a dose? This does not apply. Do not use more than one piece of gum at a time. What may interact with this medication? Certain medications for lung or breathing disease, such as asthma Medications for blood pressure Medications for depression This list may not describe all possible interactions. Give your health care provider a list of all the medicines, herbs, non-prescription drugs, or dietary supplements you use. Also tell them if you smoke, drink alcohol, or use illegal drugs. Some items may interact with your medicine. What should I watch for while using this medication? Visit your care team for regular checks on your progress. Talk to your care team about what you can do to improve your chances of quitting. What side effects may I notice from receiving this medication? Side effects that you should report to your care team as soon as possible: Allergic reactions--skin rash, itching, hives, swelling of the face, lips, tongue, or throat Heart palpitations--rapid, pounding, or irregular heartbeat Increase in blood pressure Side effects that usually do not require medical attention (report to your care team if they continue or are bothersome): Dizziness Headache Heartburn Hiccups Irritation inside the mouth or throat Trouble sleeping This list may not describe all possible side effects. Call your doctor for medical advice about side effects. You may report side effects to FDA at 1-800-FDA-1088. Where should I keep my medication? This product may have enough nicotine  in it to make children and pets sick. Keep it  away from children and pets. After using, throw away as directed on the package. Store at room temperature between 15 and 30 degrees C (59 and 86 degrees F). Protect from heat and light. Keep this medication in  the original container until ready to use. Throw away unused medication after the expiration date. NOTE: This sheet is a summary. It may not cover all possible information. If you have questions about this medicine, talk to your doctor, pharmacist, or health care provider.  2024 Elsevier/Gold Standard (2022-07-27 00:00:00)

## 2024-01-20 NOTE — Progress Notes (Signed)
 Subjective:    Patient ID: Ray Williams, male    DOB: 02-17-1951, 73 y.o.   MRN: [id number]  Chief Complaint  Patient presents with   Medical Management of Chronic Issues    Patient presents today for a 6 month follow-up.   Quality Metric Gaps    AWV, zoster    HPI Discussed the use of AI scribe software for clinical note transcription with the patient, who gave verbal consent to proceed.  History of Present Illness Patient is a 73 year old male in today for follow up on chronic medical concerns. No recent febrile illness or hospitalizations. Denies CP/palp/SOB/HA/congestion/fevers/GI or GU c/o. Taking meds as prescribed. Overall he is doing well. He would like to quit smoking soon but is not quite ready. He is trying to stay active and eat well.      Past Medical History:  Diagnosis Date   Anemia 01/26/2018   Anxiety    Depression    Depression with anxiety 01/08/2016   H/O ischemic right MCA stroke    History of chicken pox 05/11/2017   Hyperlipidemia    Hypertension    Liver disease    hepatitis c   Preventative health care 08/31/2016   Right shoulder pain 05/11/2017   Stroke [facility])     Past Surgical History:  Procedure Laterality Date   CATARACT EXTRACTION, BILATERAL  2022   EYE SURGERY Bilateral 2021   cataracts    Family History  Problem Relation Age of Onset   Heart disease Mother        MI   Heart attack Mother    Asthma Mother    Hyperlipidemia Mother    Hypertension Mother    Diabetes Father    Hyperlipidemia Sister    Healthy Maternal Grandmother    Healthy Maternal Grandfather    Healthy Paternal Grandmother    Healthy Paternal Grandfather    Colon cancer Neg Hx    Stomach cancer Neg Hx    Esophageal cancer Neg Hx     Social History   Socioeconomic History   Marital status: Married    Spouse name: Not on file   Number of children: 2   Years of education: 12   Highest education level: Not on file  Occupational History    Occupation: Nutritional therapist  Tobacco Use   Smoking status: Every Day    Current packs/day: 0.50    Types: Cigarettes   Smokeless tobacco: Never  Vaping Use   Vaping status: Never Used  Substance and Sexual Activity   Alcohol use: No    Alcohol/week: 0.0 standard drinks of alcohol   Drug use: No   Sexual activity: Not Currently  Other Topics Concern   Not on file  Social History Narrative   Fun: Fish, collectable cars/classic cars   Denies religious beliefs effecting health care   Retired from Retail banker   Lives with wife   No dietary restrictions, wears seat belt.    Social Drivers of Corporate investment banker Strain: Low Risk  (10/27/2021)   Overall Financial Resource Strain (CARDIA)    Difficulty of Paying Living Expenses: Not hard at all  Food Insecurity: No Food Insecurity (11/02/2022)   Hunger Vital Sign    Worried About Running Out of Food in the Last Year: Never true    Ran Out of Food in the Last Year: Never true  Transportation Needs: No Transportation Needs (11/02/2022)   PRAPARE - Transportation    Lack  of Transportation (Medical): No    Lack of Transportation (Non-Medical): No  Physical Activity: Inactive (10/27/2021)   Exercise Vital Sign    Days of Exercise per Week: 0 days    Minutes of Exercise per Session: 0 min  Stress: No Stress Concern Present (10/27/2021)   [employer] of Occupational Health - Occupational Stress Questionnaire    Feeling of Stress : Not at all  Social Connections: Moderately Isolated (10/27/2021)   Social Connection and Isolation Panel    Frequency of Communication with Friends and Family: More than three times a week    Frequency of Social Gatherings with Friends and Family: More than three times a week    Attends Religious Services: Never    Database administrator or Organizations: No    Attends Banker Meetings: Never    Marital Status: Married  Catering manager Violence: Not At Risk (11/02/2022)    Humiliation, Afraid, Rape, and Kick questionnaire    Fear of Current or Ex-Partner: No    Emotionally Abused: No    Physically Abused: No    Sexually Abused: No    Outpatient Medications Prior to Visit  Medication Sig Dispense Refill   atorvastatin  (LIPITOR) 80 MG tablet Take 1 tablet (80 mg total) by mouth daily. 90 tablet 0   citalopram  (CELEXA ) 20 MG tablet Take 1 tablet (20 mg total) by mouth daily. 90 tablet 0   clopidogrel  (PLAVIX ) 75 MG tablet Take 1 tablet (75 mg total) by mouth daily. 90 tablet 0   lisinopril -hydrochlorothiazide  (ZESTORETIC ) 20-25 MG tablet Take 1 tablet by mouth daily. 90 tablet 0   pantoprazole  (PROTONIX ) 40 MG tablet Take 1 tablet (40 mg total) by mouth daily. Take 1 tablet 30 minutes before first meal of day 90 tablet 1   No facility-administered medications prior to visit.    No Known Allergies  Review of Systems  Constitutional:  Negative for fever and malaise/fatigue.  HENT:  Negative for congestion.   Eyes:  Negative for blurred vision.  Respiratory:  Negative for shortness of breath.   Cardiovascular:  Negative for chest pain, palpitations and leg swelling.  Gastrointestinal:  Negative for abdominal pain, blood in stool and nausea.  Genitourinary:  Negative for dysuria and frequency.  Musculoskeletal:  Negative for falls.  Skin:  Negative for rash.  Neurological:  Negative for dizziness, loss of consciousness and headaches.  Endo/Heme/Allergies:  Negative for environmental allergies.  Psychiatric/Behavioral:  Negative for depression. The patient is not nervous/anxious.        Objective:    Physical Exam Vitals reviewed.  Constitutional:      Appearance: Normal appearance. He is not ill-appearing.  HENT:     Head: Normocephalic and atraumatic.     Nose: Nose normal.   Eyes:     Conjunctiva/sclera: Conjunctivae normal.    Cardiovascular:     Rate and Rhythm: Normal rate.     Pulses: Normal pulses.     Heart sounds: Normal heart  sounds. No murmur heard. Pulmonary:     Effort: Pulmonary effort is normal.     Breath sounds: Normal breath sounds. No wheezing.  Abdominal:     Palpations: Abdomen is soft. There is no mass.     Tenderness: There is no abdominal tenderness.   Musculoskeletal:     Cervical back: Normal range of motion.     Right lower leg: No edema.     Left lower leg: No edema.   Skin:    General:  Skin is warm and dry.   Neurological:     General: No focal deficit present.     Mental Status: He is alert and oriented to person, place, and time.   Psychiatric:        Mood and Affect: Mood normal.     BP 122/68   Pulse 68   Resp 16   Ht 5' 6 (1.676 m)   Wt 164 lb 6.4 oz (74.6 kg)   SpO2 99%   BMI 26.53 kg/m  Wt Readings from Last 3 Encounters:  01/20/24 164 lb 6.4 oz (74.6 kg)  07/20/23 164 lb (74.4 kg)  07/21/22 156 lb 9.6 oz (71 kg)    Diabetic Foot Exam - Simple   No data filed    Lab Results  Component Value Date   WBC 5.9 01/20/2024   HGB 12.4 (L) 01/20/2024   HCT 38.1 (L) 01/20/2024   PLT 266 01/20/2024   GLUCOSE 104 (H) 01/20/2024   CHOL 128 01/20/2024   TRIG 106 01/20/2024   HDL 40 01/20/2024   LDLDIRECT 60.0 01/24/2018   LDLCALC 69 01/20/2024   ALT 15 01/20/2024   AST 7 (L) 01/20/2024   NA 137 01/20/2024   K 5.7 (H) 01/20/2024   CL 101 01/20/2024   CREATININE 1.40 (H) 01/20/2024   BUN 21 01/20/2024   CO2 28 01/20/2024   TSH 0.77 01/20/2024   PSA 0.99 07/20/2023   INR 0.96 01/08/2016   HGBA1C 5.9 (H) 01/20/2024    Lab Results  Component Value Date   TSH 0.77 01/20/2024   Lab Results  Component Value Date   WBC 5.9 01/20/2024   HGB 12.4 (L) 01/20/2024   HCT 38.1 (L) 01/20/2024   MCV 91.1 01/20/2024   PLT 266 01/20/2024   Lab Results  Component Value Date   NA 137 01/20/2024   K 5.7 (H) 01/20/2024   CO2 28 01/20/2024   GLUCOSE 104 (H) 01/20/2024   BUN 21 01/20/2024   CREATININE 1.40 (H) 01/20/2024   BILITOT 0.5 01/20/2024   ALKPHOS 84  07/20/2023   AST 7 (L) 01/20/2024   ALT 15 01/20/2024   PROT 7.3 01/20/2024   ALBUMIN 4.5 07/20/2023   CALCIUM  9.7 01/20/2024   ANIONGAP 9 05/02/2022   EGFR 54 (L) 01/20/2024   GFR 49.78 (L) 07/20/2023   Lab Results  Component Value Date   CHOL 128 01/20/2024   Lab Results  Component Value Date   HDL 40 01/20/2024   Lab Results  Component Value Date   LDLCALC 69 01/20/2024   Lab Results  Component Value Date   TRIG 106 01/20/2024   Lab Results  Component Value Date   CHOLHDL 3.2 01/20/2024   Lab Results  Component Value Date   HGBA1C 5.9 (H) 01/20/2024       Assessment & Plan:  B12 deficiency Assessment & Plan: Supplement and monitor   Orders: -     Vitamin B12  Essential hypertension Assessment & Plan: Well controlled, no changes to meds. Encouraged heart healthy diet such as the DASH diet and exercise as tolerated.    Orders: -     CBC with Differential/Platelet -     Comprehensive metabolic panel with GFR -     Lisinopril -hydroCHLOROthiazide ; Take 1 tablet by mouth daily.  Dispense: 90 tablet; Refill: 1 -     Atorvastatin  Calcium ; Take 1 tablet (80 mg total) by mouth daily.  Dispense: 90 tablet; Refill: 1  Mixed hyperlipidemia Assessment & Plan: Encourage  heart healthy diet such as MIND or DASH diet, increase exercise, avoid trans fats, simple carbohydrates and processed foods, consider a krill or fish or flaxseed oil cap daily.  Tolerating Atorvastatin   Orders: -     Lipid panel -     TSH -     Atorvastatin  Calcium ; Take 1 tablet (80 mg total) by mouth daily.  Dispense: 90 tablet; Refill: 1  Hyperglycemia Assessment & Plan: hgba1c acceptable, minimize simple carbs. Increase exercise as tolerated.   Orders: -     CBC with Differential/Platelet -     Comprehensive metabolic panel with GFR -     Hemoglobin A1c  Tobacco abuse Assessment & Plan: He is interested in trying to cut back for his grandchildren but does not currently feel ready to  try medications or counseling. He will let us  know if he is ready and we discussed strategies to cut down.    Cerebrovascular accident (CVA) due to occlusion of precerebral artery (HCC) -     Clopidogrel  Bisulfate; Take 1 tablet (75 mg total) by mouth daily.  Dispense: 90 tablet; Refill: 1  Depression with anxiety -     Citalopram  Hydrobromide; Take 1 tablet (20 mg total) by mouth daily.  Dispense: 90 tablet; Refill: 1    Assessment and Plan Assessment & Plan      Randie Bustle, MD

## 2024-01-21 ENCOUNTER — Ambulatory Visit: Payer: Self-pay | Admitting: Family Medicine

## 2024-01-21 DIAGNOSIS — E875 Hyperkalemia: Secondary | ICD-10-CM

## 2024-01-21 DIAGNOSIS — E538 Deficiency of other specified B group vitamins: Secondary | ICD-10-CM

## 2024-01-21 LAB — COMPREHENSIVE METABOLIC PANEL WITH GFR
AG Ratio: 1.5 (calc) (ref 1.0–2.5)
ALT: 15 U/L (ref 9–46)
AST: 7 U/L — ABNORMAL LOW (ref 10–35)
Albumin: 4.4 g/dL (ref 3.6–5.1)
Alkaline phosphatase (APISO): 93 U/L (ref 35–144)
BUN/Creatinine Ratio: 15 (calc) (ref 6–22)
BUN: 21 mg/dL (ref 7–25)
CO2: 28 mmol/L (ref 20–32)
Calcium: 9.7 mg/dL (ref 8.6–10.3)
Chloride: 101 mmol/L (ref 98–110)
Creat: 1.4 mg/dL — ABNORMAL HIGH (ref 0.70–1.28)
Globulin: 2.9 g/dL (ref 1.9–3.7)
Glucose, Bld: 104 mg/dL — ABNORMAL HIGH (ref 65–99)
Potassium: 5.7 mmol/L — ABNORMAL HIGH (ref 3.5–5.3)
Sodium: 137 mmol/L (ref 135–146)
Total Bilirubin: 0.5 mg/dL (ref 0.2–1.2)
Total Protein: 7.3 g/dL (ref 6.1–8.1)
eGFR: 54 mL/min/{1.73_m2} — ABNORMAL LOW (ref >=60)

## 2024-01-21 LAB — CBC WITH DIFFERENTIAL/PLATELET
Absolute Lymphocytes: 1162 {cells}/uL (ref 850–3900)
Absolute Monocytes: 502 {cells}/uL (ref 200–950)
Basophils Absolute: 59 {cells}/uL (ref 0–200)
Basophils Relative: 1 %
Eosinophils Absolute: 401 {cells}/uL (ref 15–500)
Eosinophils Relative: 6.8 %
HCT: 38.1 % — ABNORMAL LOW (ref 38.5–50.0)
Hemoglobin: 12.4 g/dL — ABNORMAL LOW (ref 13.2–17.1)
MCH: 29.7 pg (ref 27.0–33.0)
MCHC: 32.5 g/dL (ref 32.0–36.0)
MCV: 91.1 fL (ref 80.0–100.0)
MPV: 9.8 fL (ref 7.5–12.5)
Monocytes Relative: 8.5 %
Neutro Abs: 3776 {cells}/uL (ref 1500–7800)
Neutrophils Relative %: 64 %
Platelets: 266 10*3/uL (ref 140–400)
RBC: 4.18 10*6/uL — ABNORMAL LOW (ref 4.20–5.80)
RDW: 13.1 % (ref 11.0–15.0)
Total Lymphocyte: 19.7 %
WBC: 5.9 10*3/uL (ref 3.8–10.8)

## 2024-01-21 LAB — HEMOGLOBIN A1C
Hgb A1c MFr Bld: 5.9 % — ABNORMAL HIGH (ref <5.7)
Mean Plasma Glucose: 123 mg/dL
eAG (mmol/L): 6.8 mmol/L

## 2024-01-21 LAB — TSH: TSH: 0.77 m[IU]/L (ref 0.40–4.50)

## 2024-01-21 LAB — LIPID PANEL
Cholesterol: 128 mg/dL (ref <200)
HDL: 40 mg/dL (ref >=40)
LDL Cholesterol (Calc): 69 mg/dL
Non-HDL Cholesterol (Calc): 88 mg/dL (ref <130)
Total CHOL/HDL Ratio: 3.2 (calc) (ref <5.0)
Triglycerides: 106 mg/dL (ref <150)

## 2024-01-21 LAB — VITAMIN B12: Vitamin B-12: 1281 pg/mL — ABNORMAL HIGH (ref 200–1100)

## 2024-01-25 ENCOUNTER — Telehealth: Payer: Self-pay | Admitting: Family Medicine

## 2024-01-25 NOTE — Telephone Encounter (Signed)
 Copied from CRM [id number]. Topic: Clinical - Lab/Test Results >> Jan 24, 2024  4:55 PM [employer] wrote: Reason for CRM: Patient's wife would like clarification on his A1C range.   Callback #: [phone]

## 2024-01-27 NOTE — Telephone Encounter (Signed)
 Patient's wife (Luanne) was advised and verbalized understanding.

## 2024-01-31 ENCOUNTER — Other Ambulatory Visit (INDEPENDENT_AMBULATORY_CARE_PROVIDER_SITE_OTHER)

## 2024-01-31 ENCOUNTER — Ambulatory Visit: Payer: Self-pay | Admitting: Family Medicine

## 2024-01-31 DIAGNOSIS — E875 Hyperkalemia: Secondary | ICD-10-CM

## 2024-01-31 DIAGNOSIS — E538 Deficiency of other specified B group vitamins: Secondary | ICD-10-CM | POA: Diagnosis not present

## 2024-01-31 LAB — COMPREHENSIVE METABOLIC PANEL WITH GFR
ALT: 14 U/L (ref 0–53)
AST: 7 U/L (ref 0–37)
Albumin: 4.2 g/dL (ref 3.5–5.2)
Alkaline Phosphatase: 79 U/L (ref 39–117)
BUN: 35 mg/dL — ABNORMAL HIGH (ref 6–23)
CO2: 24 meq/L (ref 19–32)
Calcium: 9.3 mg/dL (ref 8.4–10.5)
Chloride: 106 meq/L (ref 96–112)
Creatinine, Ser: 1.66 mg/dL — ABNORMAL HIGH (ref 0.40–1.50)
GFR: 41.12 mL/min — ABNORMAL LOW (ref >60.00)
Glucose, Bld: 108 mg/dL — ABNORMAL HIGH (ref 70–99)
Potassium: 5.7 meq/L — ABNORMAL HIGH (ref 3.5–5.1)
Sodium: 138 meq/L (ref 135–145)
Total Bilirubin: 0.3 mg/dL (ref 0.2–1.2)
Total Protein: 6.9 g/dL (ref 6.0–8.3)

## 2024-01-31 LAB — VITAMIN B12: Vitamin B-12: 834 pg/mL (ref 211–911)

## 2024-02-03 ENCOUNTER — Other Ambulatory Visit (INDEPENDENT_AMBULATORY_CARE_PROVIDER_SITE_OTHER)

## 2024-02-03 DIAGNOSIS — E875 Hyperkalemia: Secondary | ICD-10-CM

## 2024-02-03 LAB — COMPREHENSIVE METABOLIC PANEL WITH GFR
ALT: 13 U/L (ref 0–53)
AST: 7 U/L (ref 0–37)
Albumin: 4.4 g/dL (ref 3.5–5.2)
Alkaline Phosphatase: 83 U/L (ref 39–117)
BUN: 27 mg/dL — ABNORMAL HIGH (ref 6–23)
CO2: 25 meq/L (ref 19–32)
Calcium: 9.7 mg/dL (ref 8.4–10.5)
Chloride: 101 meq/L (ref 96–112)
Creatinine, Ser: 1.47 mg/dL (ref 0.40–1.50)
GFR: 47.57 mL/min — ABNORMAL LOW (ref >60.00)
Glucose, Bld: 97 mg/dL (ref 70–99)
Potassium: 5.7 meq/L — ABNORMAL HIGH (ref 3.5–5.1)
Sodium: 133 meq/L — ABNORMAL LOW (ref 135–145)
Total Bilirubin: 0.6 mg/dL (ref 0.2–1.2)
Total Protein: 6.9 g/dL (ref 6.0–8.3)

## 2024-02-04 ENCOUNTER — Ambulatory Visit: Payer: Self-pay | Admitting: Family Medicine

## 2024-02-04 DIAGNOSIS — E875 Hyperkalemia: Secondary | ICD-10-CM

## 2024-02-08 ENCOUNTER — Telehealth: Payer: Self-pay | Admitting: Family Medicine

## 2024-02-08 MED ORDER — SODIUM POLYSTYRENE SULFONATE PO POWD: ORAL | 0 refills | Status: AC

## 2024-02-08 NOTE — Telephone Encounter (Signed)
 Copied from CRM [id number]. Topic: Clinical - Medication Question >> Feb 08, 2024  3:27 PM Gennette ORN wrote: Reason for CRM: Patient is calling back due to the miss call he had. Patient has concerns about his 30 day supply as well.

## 2024-02-08 NOTE — Telephone Encounter (Signed)
 Another encounter open.

## 2024-02-09 ENCOUNTER — Other Ambulatory Visit

## 2024-02-10 ENCOUNTER — Other Ambulatory Visit

## 2024-02-17 NOTE — Telephone Encounter (Signed)
 Called patient to advise he should take 15 mg daily and no answer left vm to return call.   Copied from CRM [id number]. Topic: Clinical - Medication Question >> Feb 17, 2024  2:03 PM Deleta S wrote: Reason for RMF:[id number] about sodium polystyrene powder patient is wondering how should he use this medication and additional information.

## 2024-03-07 ENCOUNTER — Ambulatory Visit (INDEPENDENT_AMBULATORY_CARE_PROVIDER_SITE_OTHER)

## 2024-03-07 VITALS — Ht 66.0 in | Wt 164.0 lb

## 2024-03-07 DIAGNOSIS — Z0000 Encounter for general adult medical examination without abnormal findings: Secondary | ICD-10-CM | POA: Diagnosis not present

## 2024-03-07 NOTE — Progress Notes (Signed)
 Subjective:   Ray Williams is a 73 y.o. who presents for a Medicare Wellness preventive visit.  As a reminder, Annual Wellness Visits don't include a physical exam, and some assessments may be limited, especially if this visit is performed virtually. We may recommend an in-person follow-up visit with your provider if needed.  Visit Complete: Virtual I connected with  Carlin Monte on 03/07/24 by a audio enabled telemedicine application and verified that I am speaking with the correct person using two identifiers.  Patient Location: Home  Provider Location: Home Office  I discussed the limitations of evaluation and management by telemedicine. The patient expressed understanding and agreed to proceed.  Vital Signs: Because this visit was a virtual/telehealth visit, some criteria may be missing or patient reported. Any vitals not documented were not able to be obtained and vitals that have been documented are patient reported.  VideoDeclined- This patient declined Librarian, academic. Therefore the visit was completed with audio only.  Persons Participating in Visit: Patient.  AWV Questionnaire: No: Patient Medicare AWV questionnaire was not completed prior to this visit.  Cardiac Risk Factors include: advanced age (>30men, >61 women);male gender;dyslipidemia;hypertension     Objective:    Today's Vitals   03/07/24 1013  Weight: 164 lb (74.4 kg)  Height: 5' 6 (1.676 m)   Body mass index is 26.47 kg/m.     03/07/2024   10:36 AM 11/02/2022    3:42 PM 05/02/2022    3:47 PM 10/27/2021    3:20 PM 04/05/2015   12:46 PM 01/27/2015    7:00 PM  Advanced Directives  Does Patient Have a Medical Advance Directive? No Yes Yes Yes No  Yes   Type of Special educational needs teacher of [place];Living will Healthcare Power of [place];Living will Healthcare Power of [place];Living will    Does patient want to make changes to medical advance directive?  No -  Patient declined      Copy of Healthcare Power of Attorney in Chart?  No - copy requested  No - copy requested    Would patient like information on creating a medical advance directive? Yes (MAU/Ambulatory/Procedural Areas - Information given)          Data saved with a previous flowsheet row definition    Current Medications (verified) Outpatient Encounter Medications as of 03/07/2024  Medication Sig   atorvastatin  (LIPITOR) 80 MG tablet Take 1 tablet (80 mg total) by mouth daily.   citalopram  (CELEXA ) 20 MG tablet Take 1 tablet (20 mg total) by mouth daily.   clopidogrel  (PLAVIX ) 75 MG tablet Take 1 tablet (75 mg total) by mouth daily.   lisinopril -hydrochlorothiazide  (ZESTORETIC ) 20-25 MG tablet Take 1 tablet by mouth daily.   sodium polystyrene (KAYEXALATE ) powder Take by mouth 15 gm daily.   No facility-administered encounter medications on file as of 03/07/2024.    Allergies (verified) Patient has no known allergies.   History: Past Medical History:  Diagnosis Date   Anemia 01/26/2018   Anxiety    Depression    Depression with anxiety 01/08/2016   H/O ischemic right MCA stroke    History of chicken pox 05/11/2017   Hyperlipidemia    Hypertension    Liver disease    hepatitis c   Preventative health care 08/31/2016   Right shoulder pain 05/11/2017   Stroke [facility])    Past Surgical History:  Procedure Laterality Date   CATARACT EXTRACTION, BILATERAL  2022   EYE SURGERY Bilateral 2021  cataracts   Family History  Problem Relation Age of Onset   Heart disease Mother        MI   Heart attack Mother    Asthma Mother    Hyperlipidemia Mother    Hypertension Mother    Diabetes Father    Hyperlipidemia Sister    Healthy Maternal Grandmother    Healthy Maternal Grandfather    Healthy Paternal Grandmother    Healthy Paternal Grandfather    Colon cancer Neg Hx    Stomach cancer Neg Hx    Esophageal cancer Neg Hx    Social History   Socioeconomic History    Marital status: Married    Spouse name: Not on file   Number of children: 2   Years of education: 12   Highest education level: Not on file  Occupational History   Occupation: Nutritional therapist  Tobacco Use   Smoking status: Every Day    Current packs/day: 0.50    Types: Cigarettes   Smokeless tobacco: Never  Vaping Use   Vaping status: Never Used  Substance and Sexual Activity   Alcohol use: No    Alcohol/week: 0.0 standard drinks of alcohol   Drug use: No   Sexual activity: Not Currently  Other Topics Concern   Not on file  Social History Narrative   Fun: Fish, collectable cars/classic cars   Denies religious beliefs effecting health care   Retired from Retail banker   Lives with wife   No dietary restrictions, wears seat belt.    Social Drivers of Corporate investment banker Strain: Low Risk  (03/07/2024)   Overall Financial Resource Strain (CARDIA)    Difficulty of Paying Living Expenses: Not hard at all  Food Insecurity: No Food Insecurity (03/07/2024)   Hunger Vital Sign    Worried About Running Out of Food in the Last Year: Never true    Ran Out of Food in the Last Year: Never true  Transportation Needs: No Transportation Needs (03/07/2024)   PRAPARE - Administrator, Civil Service (Medical): No    Lack of Transportation (Non-Medical): No  Physical Activity: Inactive (03/07/2024)   Exercise Vital Sign    Days of Exercise per Week: 0 days    Minutes of Exercise per Session: 0 min  Stress: No Stress Concern Present (03/07/2024)   [employer] of Occupational Health - Occupational Stress Questionnaire    Feeling of Stress: Not at all  Social Connections: Moderately Isolated (03/07/2024)   Social Connection and Isolation Panel    Frequency of Communication with Friends and Family: More than three times a week    Frequency of Social Gatherings with Friends and Family: Three times a week    Attends Religious Services: Never    Active Member  of Clubs or Organizations: No    Attends Banker Meetings: Never    Marital Status: Married    Tobacco Counseling Ready to quit: Not Answered Counseling given: Not Answered    Clinical Intake:  Pre-visit preparation completed: Yes  Pain : No/denies pain     Diabetes: No  Lab Results  Component Value Date   HGBA1C 5.9 (H) 01/20/2024   HGBA1C 5.9 07/20/2023   HGBA1C 6.2 07/21/2022     How often do you need to have someone help you when you read instructions, pamphlets, or other written materials from your doctor or pharmacy?: 1 - Never  Interpreter Needed?: No  Information entered by :: Charmaine Bloodgood LPN  Activities of Daily Living     03/07/2024   10:15 AM  In your present state of health, do you have any difficulty performing the following activities:  Hearing? 0  Vision? 0  Difficulty concentrating or making decisions? 0  Walking or climbing stairs? 0  Dressing or bathing? 0  Doing errands, shopping? 0  Preparing Food and eating ? N  Using the Toilet? N  In the past six months, have you accidently leaked urine? N  Do you have problems with loss of bowel control? N  Managing your Medications? N  Managing your Finances? N  Housekeeping or managing your Housekeeping? N    Patient Care Team: Domenica Harlene LABOR, MD as PCP - General (Family Medicine)  I have updated your Care Teams any recent Medical Services you may have received from other providers in the past year.     Assessment:   This is a routine wellness examination for Harlan.  Hearing/Vision screen Hearing Screening - Comments:: Denies hearing difficulties   Vision Screening - Comments:: up to date with routine eye exams with [facility]    Goals Addressed             This Visit's Progress    Patient Stated   On track    Maintain current health       Depression Screen     03/07/2024   10:36 AM 01/20/2024    9:42 AM 07/20/2023    9:40 AM 11/02/2022    3:45 PM  07/21/2022   10:49 AM 05/05/2022    1:41 PM 03/20/2022    9:14 AM  PHQ 2/9 Scores  PHQ - 2 Score 0 0 0 0 0 0 0  PHQ- 9 Score 0 0 0  0  0    Fall Risk     03/07/2024   10:37 AM 07/20/2023    9:40 AM 11/02/2022    3:43 PM 07/21/2022   10:49 AM 05/05/2022    1:41 PM  Fall Risk   Falls in the past year? 0 0 0 0 0  Number falls in past yr: 0 0 0 0 0  Injury with Fall? 0 0 0 0 1  Risk for fall due to : No Fall Risks No Fall Risks No Fall Risks  Impaired balance/gait  Follow up Falls prevention discussed;Education provided;Falls evaluation completed Falls evaluation completed Falls evaluation completed  Falls evaluation completed;Falls prevention discussed      Data saved with a previous flowsheet row definition    MEDICARE RISK AT HOME:  Medicare Risk at Home Any stairs in or around the home?: No If so, are there any without handrails?: No Home free of loose throw rugs in walkways, pet beds, electrical cords, etc?: Yes Adequate lighting in your home to reduce risk of falls?: Yes Life alert?: No Use of a cane, walker or w/c?: No Grab bars in the bathroom?: Yes Shower chair or bench in shower?: No Elevated toilet seat or a handicapped toilet?: Yes  TIMED UP AND GO:  Was the test performed?  No  Cognitive Function: Declined/Normal: No cognitive concerns noted by patient or family. Patient alert, oriented, able to answer questions appropriately and recall recent events. No signs of memory loss or confusion.        11/02/2022    3:50 PM  6CIT Screen  What Year? 0 points  What month? 3 points  What time? 0 points  Count back from 20 0 points  Months in reverse  0 points  Repeat phrase 0 points  Total Score 3 points    Immunizations Immunization History  Administered Date(s) Administered   Hepatitis A, Adult 03/31/2016   Moderna Sars-Covid-2 Vaccination 10/14/2019, 11/23/2019   Pneumococcal Conjugate-13 01/24/2018   Pneumococcal Polysaccharide-23 03/31/2016    Screening  Tests Health Maintenance  Topic Date Due   Zoster Vaccines- Shingrix (1 of 2) Never done   DTaP/Tdap/Td (1 - Tdap) 07/19/2024 (Originally 03/27/1970)   Pneumococcal Vaccine: 50+ Years (3 of 3 - PCV20 or PCV21) 07/19/2024 (Originally 01/25/2023)   INFLUENZA VACCINE  03/10/2024   Medicare Annual Wellness (AWV)  03/07/2025   Colonoscopy  07/11/2029   Hepatitis C Screening  Completed   Hepatitis B Vaccines  Aged Out   HPV VACCINES  Aged Out   Meningococcal B Vaccine  Aged Out   COVID-19 Vaccine  Discontinued   Fecal DNA (Cologuard)  Discontinued    Health Maintenance  Health Maintenance Due  Topic Date Due   Zoster Vaccines- Shingrix (1 of 2) Never done   Health Maintenance Items Addressed: Declines vaccines at this time   Additional Screening:  Vision Screening: Recommended annual ophthalmology exams for early detection of glaucoma and other disorders of the eye. Would you like a referral to an eye doctor? No    Dental Screening: Recommended annual dental exams for proper oral hygiene  Community Resource Referral / Chronic Care Management: CRR required this visit?  No   CCM required this visit?  No   Plan:    I have personally reviewed and noted the following in the patient's chart:   Medical and social history Use of alcohol, tobacco or illicit drugs  Current medications and supplements including opioid prescriptions. Patient is not currently taking opioid prescriptions. Functional ability and status Nutritional status Physical activity Advanced directives List of other physicians Hospitalizations, surgeries, and ER visits in previous 12 months Vitals Screenings to include cognitive, depression, and falls Referrals and appointments  In addition, I have reviewed and discussed with patient certain preventive protocols, quality metrics, and best practice recommendations. A written personalized care plan for preventive services as well as general preventive health  recommendations were provided to patient.   Lavelle Pfeiffer [place], [place]   2/70/7974   After Visit Summary: (MyChart) Due to this being a telephonic visit, the after visit summary with patients personalized plan was offered to patient via MyChart   Notes: Nothing significant to report at this time.

## 2024-03-07 NOTE — Patient Instructions (Signed)
 [name] , Thank you for taking time out of your busy schedule to complete your Annual Wellness Visit with me. I enjoyed our conversation and look forward to speaking with you again next year. I, as well as your care team,  appreciate your ongoing commitment to your health goals. Please review the following plan we discussed and let me know if I can assist you in the future. Your Game plan/ To Do List     Follow up Visits: Next Medicare AWV with our clinical staff: In 1 year    Have you seen your provider in the last 6 months (3 months if uncontrolled diabetes)? Yes Next Office Visit with your provider: 12/19/285 @ 10:00  Clinician Recommendations:  Aim for 30 minutes of exercise or brisk walking, 6-8 glasses of water, and 5 servings of fruits and vegetables each day.       This is a list of the screening recommended for you and due dates:  Health Maintenance  Topic Date Due   Zoster (Shingles) Vaccine (1 of 2) Never done   DTaP/Tdap/Td vaccine (1 - Tdap) 07/19/2024*   Pneumococcal Vaccine for age over 15 (3 of 3 - PCV20 or PCV21) 07/19/2024*   Flu Shot  03/10/2024   Medicare Annual Wellness Visit  03/07/2025   Colon Cancer Screening  07/11/2029   Hepatitis C Screening  Completed   Hepatitis B Vaccine  Aged Out   HPV Vaccine  Aged Out   Meningitis B Vaccine  Aged Out   COVID-19 Vaccine  Discontinued   Cologuard (Stool DNA test)  Discontinued  *Topic was postponed. The date shown is not the original due date.    Advanced directives: (ACP Link)Information on Advanced Care Planning can be found at [place]  Secretary of [facility] Advance Health Care Directives Advance Health Care Directives. [email or web address]   Advance Care Planning is important because it:  [x]  Makes sure you receive the medical care that is consistent with your values, goals, and preferences  [x]  It provides guidance to your family and loved ones and reduces their decisional burden about whether or not they are making  the right decisions based on your wishes.  Follow the link provided in your after visit summary or read over the paperwork we have mailed to you to help you started getting your Advance Directives in place. If you need assistance in completing these, please reach out to us  so that we can help you!  See attachments for Preventive Care and Fall Prevention Tips.

## 2024-03-08 ENCOUNTER — Encounter: Payer: Self-pay | Admitting: Pharmacist

## 2024-03-08 NOTE — Progress Notes (Signed)
 Pharmacy Quality Measure Review  This patient is appearing on a report for being at risk of failing the adherence measure for cholesterol (statin) medications this calendar year.   Medication: atorvastatin   Last fill date: 10/11/2023 for 90 day supply per adherence report  Reviewed recent refill history in Dr Annemarie database. Actual last refill date was 02/01/2024 for 90 day supply. Patient has 2 refills remaining. Next appointment with PCP is 07/28/2024.    Insurance report was not up to date. No action needed at this time.  Will continue to follow for 2025.   Madelin Ray, PharmD Clinical Pharmacist [facility]

## 2024-03-09 ENCOUNTER — Other Ambulatory Visit: Payer: Self-pay | Admitting: Family Medicine

## 2024-03-09 DIAGNOSIS — I10 Essential (primary) hypertension: Secondary | ICD-10-CM

## 2024-03-09 DIAGNOSIS — I6320 Cerebral infarction due to unspecified occlusion or stenosis of unspecified precerebral arteries: Secondary | ICD-10-CM

## 2024-03-09 NOTE — Telephone Encounter (Unsigned)
 Copied from CRM [id number]. Topic: Clinical - Medication Refill >> Mar 09, 2024 12:30 PM Mesmerise C wrote: Medication:  lisinopril -hydrochlorothiazide  (ZESTORETIC ) 20-25 MG tablet  clopidogrel  (PLAVIX ) 75 MG tablet    Has the patient contacted their pharmacy? Yes (Agent: If no, request that the patient contact the pharmacy for the refill. If patient does not wish to contact the pharmacy document the reason why and proceed with request.) (Agent: If yes, when and what did the pharmacy advise?)  This is the patient's preferred pharmacy:  [facility] [address] [place] - [address] [facility] [place] [address] Phone: [phone] Fax: [phone]  Is this the correct pharmacy for this prescription? Yes If no, delete pharmacy and type the correct one.   Has the prescription been filled recently? No  Is the patient out of the medication? Yes  Has the patient been seen for an appointment in the last year OR does the patient have an upcoming appointment? Yes  Can we respond through MyChart? No  Agent: Please be advised that Rx refills may take up to 3 business days. We ask that you follow-up with your pharmacy.

## 2024-03-10 MED ORDER — LISINOPRIL-HYDROCHLOROTHIAZIDE 20-25 MG PO TABS: 1.0000 | ORAL_TABLET | Freq: Every day | ORAL | 1 refills | Status: DC

## 2024-03-10 MED ORDER — CLOPIDOGREL BISULFATE 75 MG PO TABS: 75.0000 mg | ORAL_TABLET | Freq: Every day | ORAL | 1 refills | Status: AC

## 2024-04-11 ENCOUNTER — Other Ambulatory Visit: Payer: Self-pay | Admitting: Family Medicine

## 2024-04-11 DIAGNOSIS — F418 Other specified anxiety disorders: Secondary | ICD-10-CM

## 2024-04-11 NOTE — Telephone Encounter (Unsigned)
 Copied from CRM [id number]. Topic: Clinical - Medication Refill >> Apr 11, 2024  8:57 AM Zy'onna H wrote: Medication: citalopram  (CELEXA ) 20 MG tablet  Has the patient contacted their pharmacy? Yes - pharmacy informed the patient to call the clinic to ask for a Rx refill request.  (Agent: If no, request that the patient contact the pharmacy for the refill. If patient does not wish to contact the pharmacy document the reason why and proceed with request.) (Agent: If yes, when and what did the pharmacy advise?)  This is the patient's preferred pharmacy:  [facility] [address], [place] - [address] [facility] [place] [address] Phone: [phone] Fax: [phone]  Is this the correct pharmacy for this prescription? Yes If no, delete pharmacy and type the correct one.   Has the prescription been filled recently? Yes  Is the patient out of the medication? No (will be completely out tomorrow)  Has the patient been seen for an appointment in the last year OR does the patient have an upcoming appointment? Yes  Can we respond through MyChart? Yes  Agent: Please be advised that Rx refills may take up to 3 business days. We ask that you follow-up with your pharmacy.

## 2024-04-12 MED ORDER — CITALOPRAM HYDROBROMIDE 20 MG PO TABS: 20.0000 mg | ORAL_TABLET | Freq: Every day | ORAL | 1 refills | Status: AC

## 2024-04-17 ENCOUNTER — Ambulatory Visit: Payer: Self-pay | Admitting: Family Medicine

## 2024-04-17 ENCOUNTER — Other Ambulatory Visit (INDEPENDENT_AMBULATORY_CARE_PROVIDER_SITE_OTHER)

## 2024-04-17 DIAGNOSIS — E875 Hyperkalemia: Secondary | ICD-10-CM

## 2024-04-17 LAB — COMPREHENSIVE METABOLIC PANEL WITH GFR
ALT: 17 U/L (ref 0–53)
AST: 8 U/L (ref 0–37)
Albumin: 4.3 g/dL (ref 3.5–5.2)
Alkaline Phosphatase: 80 U/L (ref 39–117)
BUN: 21 mg/dL (ref 6–23)
CO2: 28 meq/L (ref 19–32)
Calcium: 9.5 mg/dL (ref 8.4–10.5)
Chloride: 102 meq/L (ref 96–112)
Creatinine, Ser: 1.36 mg/dL (ref 0.40–1.50)
GFR: 52.15 mL/min — ABNORMAL LOW (ref >60.00)
Glucose, Bld: 94 mg/dL (ref 70–99)
Potassium: 4.9 meq/L (ref 3.5–5.1)
Sodium: 138 meq/L (ref 135–145)
Total Bilirubin: 0.6 mg/dL (ref 0.2–1.2)
Total Protein: 7 g/dL (ref 6.0–8.3)

## 2024-05-11 ENCOUNTER — Other Ambulatory Visit: Payer: Self-pay | Admitting: Pharmacist

## 2024-05-11 ENCOUNTER — Encounter: Payer: Self-pay | Admitting: Pharmacist

## 2024-05-11 DIAGNOSIS — E782 Mixed hyperlipidemia: Secondary | ICD-10-CM

## 2024-05-11 DIAGNOSIS — I10 Essential (primary) hypertension: Secondary | ICD-10-CM

## 2024-05-11 MED ORDER — ATORVASTATIN CALCIUM 80 MG PO TABS: 80.0000 mg | ORAL_TABLET | Freq: Every day | ORAL | 0 refills | Status: AC

## 2024-05-11 NOTE — Telephone Encounter (Signed)
 Will need 1 more refill for atorvastatin  to last until follow up with PCP 07/24/2024 - updated Rx sent to his pharmacy for 90 DS / no refills.

## 2024-05-11 NOTE — Progress Notes (Signed)
 Pharmacy Quality Measure Review  This patient is appearing on a report for being at risk of failing the adherence measure for cholesterol (statin) medications this calendar year.   Medication: atorvastatin   Last fill date: 02/01/2024 for 90 day supply per adherence report  Reviewed recent refill history in Dr Annemarie database. Actual last refill date was 04/24/2024 for 90 day supply. Patient has no refills remaining. Next appointment with PCP is 07/28/2024. His current supply will not last until December appointment.    Insurance report was not up to date. Patient has filled atorvastatin  on time but he will need updated Rx prior to next appointment. Will send in updated Rx to be profiled for 90 DS / 0 refills.    Madelin Ray, PharmD Clinical Pharmacist [facility]

## 2024-05-23 NOTE — Progress Notes (Unsigned)
 Subjective:     Patient ID: Ray Williams, male    DOB: 21-Apr-1951, 73 y.o.   MRN: [id number]  No chief complaint on file.   HPI  Discussed the use of AI scribe software for clinical note transcription with the patient, who gave verbal consent to proceed.  PMHx- Hx CVA, HTN, HLD, anemia, anxiety depression  HTN-lisinopril -hydrochlorothiazide  20-25 mg daily  History of CVA-on Plavix   HLD-Lipitor 80 mg daily  Anxiety and depression Celexa  20 mg daily  Patient denies fever, chills, SOB, CP, palpitations, dyspnea, edema, HA, vision changes, N/V/D, abdominal pain, urinary symptoms, rash, weight changes, and recent illness or hospitalizations.     History of Present Illness              Health Maintenance Due  Topic Date Due  . Zoster Vaccines- Shingrix (1 of 2) Never done  . Influenza Vaccine  03/10/2024    Past Medical History:  Diagnosis Date  . Anemia 01/26/2018  . Anxiety   . Depression   . Depression with anxiety 01/08/2016  . H/O ischemic right MCA stroke   . History of chicken pox 05/11/2017  . Hyperlipidemia   . Hypertension   . Liver disease    hepatitis c  . Preventative health care 08/31/2016  . Right shoulder pain 05/11/2017  . Stroke [facility])     Past Surgical History:  Procedure Laterality Date  . CATARACT EXTRACTION, BILATERAL  2022  . EYE SURGERY Bilateral 2021   cataracts    Family History  Problem Relation Age of Onset  . Heart disease Mother        MI  . Heart attack Mother   . Asthma Mother   . Hyperlipidemia Mother   . Hypertension Mother   . Diabetes Father   . Hyperlipidemia Sister   . Healthy Maternal Grandmother   . Healthy Maternal Grandfather   . Healthy Paternal Grandmother   . Healthy Paternal Grandfather   . Colon cancer Neg Hx   . Stomach cancer Neg Hx   . Esophageal cancer Neg Hx     Social History   Socioeconomic History  . Marital status: Married    Spouse name: Not on file  . Number of children: 2   . Years of education: 89  . Highest education level: Not on file  Occupational History  . Occupation: Grading Contractor  Tobacco Use  . Smoking status: Every Day    Current packs/day: 0.50    Types: Cigarettes  . Smokeless tobacco: Never  Vaping Use  . Vaping status: Never Used  Substance and Sexual Activity  . Alcohol use: No    Alcohol/week: 0.0 standard drinks of alcohol  . Drug use: No  . Sexual activity: Not Currently  Other Topics Concern  . Not on file  Social History Narrative   Fun: Fish, collectable cars/classic cars   Denies religious beliefs effecting health care   Retired from Retail banker   Lives with wife   No dietary restrictions, wears seat belt.    Social Drivers of Corporate investment banker Strain: Low Risk  (03/07/2024)   Overall Financial Resource Strain (CARDIA)   . Difficulty of Paying Living Expenses: Not hard at all  Food Insecurity: No Food Insecurity (03/07/2024)   Hunger Vital Sign   . Worried About Programme researcher, broadcasting/film/video in the Last Year: Never true   . Ran Out of Food in the Last Year: Never true  Transportation Needs: No Transportation Needs (  03/07/2024)   PRAPARE - Transportation   . Lack of Transportation (Medical): No   . Lack of Transportation (Non-Medical): No  Physical Activity: Inactive (03/07/2024)   Exercise Vital Sign   . Days of Exercise per Week: 0 days   . Minutes of Exercise per Session: 0 min  Stress: No Stress Concern Present (03/07/2024)   [employer] of Occupational Health - Occupational Stress Questionnaire   . Feeling of Stress: Not at all  Social Connections: Moderately Isolated (03/07/2024)   Social Connection and Isolation Panel   . Frequency of Communication with Friends and Family: More than three times a week   . Frequency of Social Gatherings with Friends and Family: Three times a week   . Attends Religious Services: Never   . Active Member of Clubs or Organizations: No   . Attends Tax inspector Meetings: Never   . Marital Status: Married  Catering manager Violence: Not At Risk (03/07/2024)   Humiliation, Afraid, Rape, and Kick questionnaire   . Fear of Current or Ex-Partner: No   . Emotionally Abused: No   . Physically Abused: No   . Sexually Abused: No    Outpatient Medications Prior to Visit  Medication Sig Dispense Refill  . atorvastatin  (LIPITOR) 80 MG tablet Take 1 tablet (80 mg total) by mouth daily. 90 tablet 0  . citalopram  (CELEXA ) 20 MG tablet Take 1 tablet (20 mg total) by mouth daily. 90 tablet 1  . clopidogrel  (PLAVIX ) 75 MG tablet Take 1 tablet (75 mg total) by mouth daily. 90 tablet 1  . lisinopril -hydrochlorothiazide  (ZESTORETIC ) 20-25 MG tablet Take 1 tablet by mouth daily. 90 tablet 1  . sodium polystyrene (KAYEXALATE ) powder Take by mouth 15 gm daily. 454 g 0   No facility-administered medications prior to visit.    No Known Allergies  ROS     Objective:    Physical Exam   There were no vitals taken for this visit. Wt Readings from Last 3 Encounters:  03/07/24 164 lb (74.4 kg)  01/20/24 164 lb 6.4 oz (74.6 kg)  07/20/23 164 lb (74.4 kg)       Assessment & Plan:   Problem List Items Addressed This Visit   None   I am having Ray Williams maintain his sodium polystyrene, lisinopril -hydrochlorothiazide , clopidogrel , citalopram , and atorvastatin .  No orders of the defined types were placed in this encounter.

## 2024-05-24 ENCOUNTER — Encounter: Payer: Self-pay | Admitting: Student

## 2024-05-24 ENCOUNTER — Other Ambulatory Visit (HOSPITAL_BASED_OUTPATIENT_CLINIC_OR_DEPARTMENT_OTHER): Payer: Self-pay

## 2024-05-24 ENCOUNTER — Ambulatory Visit (INDEPENDENT_AMBULATORY_CARE_PROVIDER_SITE_OTHER): Admitting: Student

## 2024-05-24 VITALS — BP 125/66 | HR 84 | Ht 66.0 in | Wt 164.4 lb

## 2024-05-24 DIAGNOSIS — F418 Other specified anxiety disorders: Secondary | ICD-10-CM

## 2024-05-24 DIAGNOSIS — Z720 Tobacco use: Secondary | ICD-10-CM | POA: Diagnosis not present

## 2024-05-24 DIAGNOSIS — Z716 Tobacco abuse counseling: Secondary | ICD-10-CM | POA: Insufficient documentation

## 2024-05-24 DIAGNOSIS — I10 Essential (primary) hypertension: Secondary | ICD-10-CM

## 2024-05-24 DIAGNOSIS — E782 Mixed hyperlipidemia: Secondary | ICD-10-CM

## 2024-05-24 MED ORDER — VARENICLINE TARTRATE (STARTER) 0.5 MG X 11 & 1 MG X 42 PO TBPK
ORAL_TABLET | ORAL | 0 refills | Status: AC
  Filled 2024-05-24: qty 53, 53d supply, fill #0

## 2024-05-24 NOTE — Patient Instructions (Signed)
[email or web address]

## 2024-07-26 NOTE — Assessment & Plan Note (Signed)
 Last hgba1c acceptable, minimize simple carbs. Increase exercise as tolerated.

## 2024-07-26 NOTE — Progress Notes (Unsigned)
 Subjective:     Patient ID: Ray Williams, male    DOB: [birth year], 73 y.o.   MRN: [id number]  No chief complaint on file.   HPI  Discussed the use of AI scribe software for clinical note transcription with the patient, who gave verbal consent to proceed.  History of Present Illness Ray Williams is a 73 year old male who presents for follow up.   PMHx- Hx CVA, HTN, HLD, anemia, anxiety depression    Smoking cessation-- hows it going? We can increast to 1 mg po TWICE PER DAY  may continue additional 12wk if initial tx successful   Patient denies fever, chills, SOB, CP, palpitations, dyspnea, edema, HA, vision changes, N/V/D, abdominal pain, urinary symptoms, rash, weight changes, and recent illness or hospitalizations.     History of Present Illness              Health Maintenance Due  Topic Date Due   DTaP/Tdap/Td (1 - Tdap) Never done   Zoster Vaccines- Shingrix (1 of 2) Never done   Pneumococcal Vaccine: 50+ Years (3 of 3 - PCV20 or PCV21) 01/25/2023   Influenza Vaccine  03/10/2024    Past Medical History:  Diagnosis Date   Anemia 01/26/2018   Anxiety    Depression    Depression with anxiety 01/08/2016   H/O ischemic right MCA stroke    History of chicken pox 05/11/2017   Hyperlipidemia    Hypertension    Liver disease    hepatitis c   Preventative health care 08/31/2016   Right shoulder pain 05/11/2017   Stroke [facility])     Past Surgical History:  Procedure Laterality Date   CATARACT EXTRACTION, BILATERAL  2022   EYE SURGERY Bilateral 2021   cataracts    Family History  Problem Relation Age of Onset   Heart disease Mother        MI   Heart attack Mother    Asthma Mother    Hyperlipidemia Mother    Hypertension Mother    Diabetes Father    Hyperlipidemia Sister    Healthy Maternal Grandmother    Healthy Maternal Grandfather    Healthy Paternal Grandmother    Healthy Paternal Grandfather    Colon cancer Neg Hx    Stomach cancer Neg  Hx    Esophageal cancer Neg Hx     Social History   Socioeconomic History   Marital status: Married    Spouse name: Not on file   Number of children: 2   Years of education: 12   Highest education level: Not on file  Occupational History   Occupation: Nutritional Therapist  Tobacco Use   Smoking status: Every Day    Current packs/day: 0.50    Types: Cigarettes   Smokeless tobacco: Never  Vaping Use   Vaping status: Never Used  Substance and Sexual Activity   Alcohol use: No    Alcohol/week: 0.0 standard drinks of alcohol   Drug use: No   Sexual activity: Not Currently  Other Topics Concern   Not on file  Social History Narrative   Fun: Fish, collectable cars/classic cars   Denies religious beliefs effecting health care   Retired from retail banker   Lives with wife   No dietary restrictions, wears seat belt.    Social Drivers of Health   Tobacco Use: High Risk (05/24/2024)   Patient History    Smoking Tobacco Use: Every Day    Smokeless Tobacco Use: Never  Passive Exposure: Not on file  Financial Resource Strain: Low Risk (03/07/2024)   Overall Financial Resource Strain (CARDIA)    Difficulty of Paying Living Expenses: Not hard at all  Food Insecurity: No Food Insecurity (03/07/2024)   Epic    Worried About Programme Researcher, Broadcasting/film/video in the Last Year: Never true    Ran Out of Food in the Last Year: Never true  Transportation Needs: No Transportation Needs (03/07/2024)   Epic    Lack of Transportation (Medical): No    Lack of Transportation (Non-Medical): No  Physical Activity: Inactive (03/07/2024)   Exercise Vital Sign    Days of Exercise per Week: 0 days    Minutes of Exercise per Session: 0 min  Stress: No Stress Concern Present (03/07/2024)   [employer] of Occupational Health - Occupational Stress Questionnaire    Feeling of Stress: Not at all  Social Connections: Moderately Isolated (03/07/2024)   Social Connection and Isolation Panel     Frequency of Communication with Friends and Family: More than three times a week    Frequency of Social Gatherings with Friends and Family: Three times a week    Attends Religious Services: Never    Active Member of Clubs or Organizations: No    Attends Banker Meetings: Never    Marital Status: Married  Catering Manager Violence: Not At Risk (03/07/2024)   Epic    Fear of Current or Ex-Partner: No    Emotionally Abused: No    Physically Abused: No    Sexually Abused: No  Depression (PHQ2-9): Low Risk (05/24/2024)   Depression (PHQ2-9)    PHQ-2 Score: 3  Alcohol Screen: Low Risk (03/07/2024)   Alcohol Screen    Last Alcohol Screening Score (AUDIT): 0  Housing: Unknown (03/07/2024)   Epic    Unable to Pay for Housing in the Last Year: No    Number of Times Moved in the Last Year: Not on file    Homeless in the Last Year: No  Utilities: Not At Risk (03/07/2024)   Epic    Threatened with loss of utilities: No  Health Literacy: Adequate Health Literacy (03/07/2024)   B1300 Health Literacy    Frequency of need for help with medical instructions: Never    Outpatient Medications Prior to Visit  Medication Sig Dispense Refill   atorvastatin  (LIPITOR) 80 MG tablet Take 1 tablet (80 mg total) by mouth daily. 90 tablet 0   citalopram  (CELEXA ) 20 MG tablet Take 1 tablet (20 mg total) by mouth daily. 90 tablet 1   clopidogrel  (PLAVIX ) 75 MG tablet Take 1 tablet (75 mg total) by mouth daily. 90 tablet 1   lisinopril -hydrochlorothiazide  (ZESTORETIC ) 20-25 MG tablet Take 1 tablet by mouth daily. 90 tablet 1   sodium polystyrene (KAYEXALATE ) powder Take by mouth 15 gm daily. 454 g 0   No facility-administered medications prior to visit.    No Known Allergies  ROS     Objective:    Physical Exam  General: No acute distress. Awake and conversant.  Eyes: Normal conjunctiva, anicteric. Round symmetric pupils.  Respiratory: CTAB. Respirations are non-labored. No wheezing.   Skin: Warm. No rashes or ulcers.  Psych: Alert and oriented. Cooperative, Appropriate mood and affect, Normal judgment.  CV: RRR. No murmur. No lower extremity edema.  MSK: Normal ambulation. No clubbing or cyanosis.  Neuro:  CN II-XII grossly normal.    There were no vitals taken for this visit. Wt Readings from Last 3 Encounters:  05/24/24 164 lb 6.4 oz (74.6 kg)  03/07/24 164 lb (74.4 kg)  01/20/24 164 lb 6.4 oz (74.6 kg)       Assessment & Plan:   Problem List Items Addressed This Visit   None   Tobacco Use Disorder He is motivated to quit smoking, influenced by his brother's success with Chantix . Discussed Chantix 's mechanism, benefits, and potential side effects, including increased suicidal thoughts. Medication and common side effects reviewed with the patient; patient voiced understanding and had no further questions at this time.  - Prescribe Chantix  (varenicline ) starting with 0.5 mg for 10 days, then increase to 1 mg. - Advise monitoring for suicidal thoughts and discontinue use if he occurs. - Provide information about [email or web address] for additional support and counseling. - Encourage deep breathing exercises and consider stress balls or fidget spinners to manage cravings. - Schedule follow-up in December to assess progress and determine if treatment extension is necessary.      I am having Carlin Monte maintain his sodium polystyrene, lisinopril -hydrochlorothiazide , clopidogrel , citalopram , and atorvastatin .  No orders of the defined types were placed in this encounter.

## 2024-07-28 ENCOUNTER — Encounter: Admitting: Student

## 2024-07-28 ENCOUNTER — Encounter: Payer: Self-pay | Admitting: Student

## 2024-07-28 VITALS — BP 115/67 | HR 83 | Ht 66.0 in | Wt 165.2 lb

## 2024-07-28 DIAGNOSIS — Z0000 Encounter for general adult medical examination without abnormal findings: Secondary | ICD-10-CM | POA: Diagnosis not present

## 2024-07-28 DIAGNOSIS — E538 Deficiency of other specified B group vitamins: Secondary | ICD-10-CM

## 2024-07-28 DIAGNOSIS — Z720 Tobacco use: Secondary | ICD-10-CM

## 2024-07-28 DIAGNOSIS — D649 Anemia, unspecified: Secondary | ICD-10-CM

## 2024-07-28 DIAGNOSIS — R739 Hyperglycemia, unspecified: Secondary | ICD-10-CM

## 2024-07-28 DIAGNOSIS — F418 Other specified anxiety disorders: Secondary | ICD-10-CM

## 2024-07-28 DIAGNOSIS — E782 Mixed hyperlipidemia: Secondary | ICD-10-CM | POA: Diagnosis not present

## 2024-07-28 DIAGNOSIS — Z125 Encounter for screening for malignant neoplasm of prostate: Secondary | ICD-10-CM | POA: Diagnosis not present

## 2024-07-28 DIAGNOSIS — I10 Essential (primary) hypertension: Secondary | ICD-10-CM

## 2024-07-28 LAB — COMPREHENSIVE METABOLIC PANEL WITH GFR
ALT: 17 U/L (ref 3–53)
AST: 7 U/L (ref 5–37)
Albumin: 4.3 g/dL (ref 3.5–5.2)
Alkaline Phosphatase: 88 U/L (ref 39–117)
BUN: 25 mg/dL — ABNORMAL HIGH (ref 6–23)
CO2: 30 meq/L (ref 19–32)
Calcium: 9.3 mg/dL (ref 8.4–10.5)
Chloride: 101 meq/L (ref 96–112)
Creatinine, Ser: 1.3 mg/dL (ref 0.40–1.50)
GFR: 54.95 mL/min — ABNORMAL LOW
Glucose, Bld: 113 mg/dL — ABNORMAL HIGH (ref 70–99)
Potassium: 4.4 meq/L (ref 3.5–5.1)
Sodium: 138 meq/L (ref 135–145)
Total Bilirubin: 0.4 mg/dL (ref 0.2–1.2)
Total Protein: 6.8 g/dL (ref 6.0–8.3)

## 2024-07-28 LAB — CBC
HCT: 37.4 % — ABNORMAL LOW (ref 39.0–52.0)
Hemoglobin: 12.7 g/dL — ABNORMAL LOW (ref 13.0–17.0)
MCHC: 33.9 g/dL (ref 30.0–36.0)
MCV: 87.9 fl (ref 78.0–100.0)
Platelets: 315 K/uL (ref 150.0–400.0)
RBC: 4.25 Mil/uL (ref 4.22–5.81)
RDW: 14.1 % (ref 11.5–15.5)
WBC: 5.4 K/uL (ref 4.0–10.5)

## 2024-07-28 LAB — HEMOGLOBIN A1C: Hgb A1c MFr Bld: 6.1 % (ref 4.6–6.5)

## 2024-07-28 LAB — PSA: PSA: 0.71 ng/mL (ref 0.10–4.00)

## 2024-07-28 LAB — VITAMIN B12: Vitamin B-12: 430 pg/mL (ref 211–911)

## 2024-07-28 MED ORDER — NICOTINE 21 MG/24HR TD PT24: 21.0000 mg | MEDICATED_PATCH | Freq: Every day | TRANSDERMAL | 0 refills | Status: AC

## 2024-07-28 NOTE — Assessment & Plan Note (Signed)
 Well controlled, no changes to meds. Encouraged heart healthy diet such as the DASH diet and exercise as tolerated.

## 2024-07-28 NOTE — Assessment & Plan Note (Signed)
 Stable on Celexa 20mg  daily. -Continue current medication regimen.

## 2024-07-28 NOTE — Assessment & Plan Note (Addendum)
 Previous Chantix  trial failed due to side effects, added to allergies list. Chronic smoker, one pack per day. Discussed nicotine  patches and Wellbutrin . Emphasized cessation readiness and benefits of lung cancer screening. - Prescribed nicotine  patches: 21 mg for 4 weeks, taper to 14 mg and 7 mg. -Report via mychart and message for refills - Referred for lung cancer screening CT scan.

## 2024-07-28 NOTE — Assessment & Plan Note (Signed)
 Supplement and monitor

## 2024-07-28 NOTE — Assessment & Plan Note (Signed)
 Patient encouraged to maintain heart healthy diet, regular exercise, adequate sleep. Consider daily probiotics.   HCM Immunizations: PNA, Influenza, Shingles- Pt declines today LDCT: Referral sent for lung cancer screening CRC: Last 2023. Due 2030

## 2024-07-28 NOTE — Assessment & Plan Note (Signed)
 Encourage heart healthy diet such as MIND or DASH diet, increase exercise, avoid trans fats, simple carbohydrates and processed foods, consider a krill or fish or flaxseed oil cap daily. Tolerating Atorvastatin

## 2024-07-28 NOTE — Assessment & Plan Note (Signed)
Labs today. Asymptomatic.

## 2024-07-30 ENCOUNTER — Ambulatory Visit: Payer: Self-pay | Admitting: Student

## 2024-08-07 DIAGNOSIS — R0981 Nasal congestion: Secondary | ICD-10-CM | POA: Diagnosis not present

## 2024-08-17 ENCOUNTER — Ambulatory Visit (HOSPITAL_BASED_OUTPATIENT_CLINIC_OR_DEPARTMENT_OTHER): Payer: Medicare (Managed Care)

## 2024-08-28 ENCOUNTER — Ambulatory Visit (HOSPITAL_BASED_OUTPATIENT_CLINIC_OR_DEPARTMENT_OTHER): Payer: Medicare (Managed Care)

## 2024-08-30 ENCOUNTER — Telehealth: Payer: Self-pay

## 2024-08-30 DIAGNOSIS — I10 Essential (primary) hypertension: Secondary | ICD-10-CM

## 2024-08-30 MED ORDER — LISINOPRIL-HYDROCHLOROTHIAZIDE 20-25 MG PO TABS: 1.0000 | ORAL_TABLET | Freq: Every day | ORAL | 1 refills | Status: AC

## 2024-08-30 NOTE — Telephone Encounter (Signed)
 Copied from CRM #[id number]. Topic: Clinical - Medication Refill >> Aug 30, 2024  1:32 PM Harlene ORN wrote: Medication: lisinopril -hydrochlorothiazide  (ZESTORETIC ) 20-25 MG tablet  Has the patient contacted their pharmacy? Yes (Agent: If no, request that the patient contact the pharmacy for the refill. If patient does not wish to contact the pharmacy document the reason why and proceed with request.) (Agent: If yes, when and what did the pharmacy advise?)  This is the patient's preferred pharmacy:  [facility] [address] [place] - [address] [facility] [place] [address] Phone: [phone] Fax: [phone]   Is this the correct pharmacy for this prescription? Yes If no, delete pharmacy and type the correct one.   Has the prescription been filled recently? No  Is the patient out of the medication? Yes  Has the patient been seen for an appointment in the last year OR does the patient have an upcoming appointment? Yes  Can we respond through MyChart? No  Agent: Please be advised that Rx refills may take up to 3 business days. We ask that you follow-up with your pharmacy.

## 2024-08-30 NOTE — Telephone Encounter (Signed)
 Rx sent.

## 2024-08-31 ENCOUNTER — Ambulatory Visit (HOSPITAL_BASED_OUTPATIENT_CLINIC_OR_DEPARTMENT_OTHER): Payer: Medicare (Managed Care)

## 2025-01-23 ENCOUNTER — Ambulatory Visit: Admitting: Student

## 2025-03-13 ENCOUNTER — Ambulatory Visit
